# Patient Record
Sex: Male | Born: 1945 | Race: Black or African American | Hispanic: No | Marital: Married | Smoking: Former smoker
Health system: Southern US, Community
[De-identification: ages and names within clinical notes are randomized; demographics above are authoritative.]

## PROBLEM LIST (undated history)

## (undated) DIAGNOSIS — K649 Unspecified hemorrhoids: Secondary | ICD-10-CM

## (undated) DIAGNOSIS — H269 Unspecified cataract: Secondary | ICD-10-CM

## (undated) DIAGNOSIS — H9190 Unspecified hearing loss, unspecified ear: Secondary | ICD-10-CM

## (undated) DIAGNOSIS — E785 Hyperlipidemia, unspecified: Secondary | ICD-10-CM

## (undated) DIAGNOSIS — I10 Essential (primary) hypertension: Secondary | ICD-10-CM

## (undated) DIAGNOSIS — Z86010 Personal history of colonic polyps: Secondary | ICD-10-CM

## (undated) DIAGNOSIS — H409 Unspecified glaucoma: Secondary | ICD-10-CM

## (undated) DIAGNOSIS — I4891 Unspecified atrial fibrillation: Secondary | ICD-10-CM

## (undated) DIAGNOSIS — M1990 Unspecified osteoarthritis, unspecified site: Secondary | ICD-10-CM

## (undated) DIAGNOSIS — I2109 ST elevation (STEMI) myocardial infarction involving other coronary artery of anterior wall: Secondary | ICD-10-CM

## (undated) HISTORY — DX: Unspecified cataract: H26.9

## (undated) HISTORY — DX: Hyperlipidemia, unspecified: E78.5

## (undated) HISTORY — DX: Unspecified osteoarthritis, unspecified site: M19.90

## (undated) HISTORY — DX: ST elevation (STEMI) myocardial infarction involving other coronary artery of anterior wall: I21.09

## (undated) HISTORY — PX: STAPEDES SURGERY: SHX789

## (undated) HISTORY — PX: COLONOSCOPY: SHX174

## (undated) HISTORY — DX: Unspecified hearing loss, unspecified ear: H91.90

## (undated) HISTORY — DX: Essential (primary) hypertension: I10

## (undated) HISTORY — DX: Unspecified atrial fibrillation: I48.91

## (undated) HISTORY — DX: Personal history of colonic polyps: Z86.010

## (undated) HISTORY — PX: POLYPECTOMY: SHX149

## (undated) HISTORY — DX: Unspecified hemorrhoids: K64.9

## (undated) HISTORY — DX: Unspecified glaucoma: H40.9

---

## 2001-11-16 ENCOUNTER — Ambulatory Visit (HOSPITAL_COMMUNITY): Admission: RE | Admit: 2001-11-16 | Discharge: 2001-11-16 | Payer: Self-pay | Admitting: *Deleted

## 2001-11-16 ENCOUNTER — Encounter (INDEPENDENT_AMBULATORY_CARE_PROVIDER_SITE_OTHER): Payer: Self-pay

## 2001-11-16 ENCOUNTER — Encounter (INDEPENDENT_AMBULATORY_CARE_PROVIDER_SITE_OTHER): Payer: Self-pay | Admitting: *Deleted

## 2005-03-05 ENCOUNTER — Ambulatory Visit: Payer: Self-pay | Admitting: Family Medicine

## 2005-03-12 ENCOUNTER — Ambulatory Visit: Payer: Self-pay | Admitting: Family Medicine

## 2005-06-11 ENCOUNTER — Ambulatory Visit: Payer: Self-pay | Admitting: Family Medicine

## 2005-06-12 ENCOUNTER — Encounter: Admission: RE | Admit: 2005-06-12 | Discharge: 2005-06-12 | Payer: Self-pay | Admitting: Family Medicine

## 2005-10-17 ENCOUNTER — Ambulatory Visit: Payer: Self-pay | Admitting: Family Medicine

## 2006-06-04 ENCOUNTER — Ambulatory Visit: Payer: Self-pay | Admitting: Family Medicine

## 2006-06-04 LAB — CONVERTED CEMR LAB
ALT: 19 units/L (ref 0–40)
AST: 22 units/L (ref 0–37)
Alkaline Phosphatase: 38 units/L — ABNORMAL LOW (ref 39–117)
BUN: 11 mg/dL (ref 6–23)
Basophils Relative: 0.8 % (ref 0.0–1.0)
Bilirubin, Direct: 0.1 mg/dL (ref 0.0–0.3)
CO2: 33 meq/L — ABNORMAL HIGH (ref 19–32)
Calcium: 9.7 mg/dL (ref 8.4–10.5)
Chloride: 103 meq/L (ref 96–112)
Creatinine, Ser: 1 mg/dL (ref 0.4–1.5)
Eosinophils Relative: 1.6 % (ref 0.0–5.0)
Glucose, Bld: 108 mg/dL — ABNORMAL HIGH (ref 70–99)
LDL Cholesterol: 104 mg/dL — ABNORMAL HIGH (ref 0–99)
Lymphocytes Relative: 29.8 % (ref 12.0–46.0)
Neutro Abs: 4.4 10*3/uL (ref 1.4–7.7)
Platelets: 206 10*3/uL (ref 150–400)
RBC: 4.99 M/uL (ref 4.22–5.81)
RDW: 12.1 % (ref 11.5–14.6)
Total Protein: 7 g/dL (ref 6.0–8.3)
Triglycerides: 227 mg/dL (ref 0–149)
VLDL: 45 mg/dL — ABNORMAL HIGH (ref 0–40)
WBC: 7.9 10*3/uL (ref 4.5–10.5)

## 2006-06-11 ENCOUNTER — Ambulatory Visit: Payer: Self-pay | Admitting: Family Medicine

## 2006-07-29 ENCOUNTER — Ambulatory Visit: Payer: Self-pay | Admitting: Family Medicine

## 2006-09-23 DIAGNOSIS — I10 Essential (primary) hypertension: Secondary | ICD-10-CM

## 2006-09-23 DIAGNOSIS — E785 Hyperlipidemia, unspecified: Secondary | ICD-10-CM | POA: Insufficient documentation

## 2006-10-13 ENCOUNTER — Telehealth: Payer: Self-pay | Admitting: Family Medicine

## 2006-10-22 ENCOUNTER — Ambulatory Visit: Payer: Self-pay | Admitting: Family Medicine

## 2007-03-16 ENCOUNTER — Telehealth: Payer: Self-pay

## 2007-03-18 ENCOUNTER — Ambulatory Visit: Payer: Self-pay | Admitting: Family Medicine

## 2007-03-18 DIAGNOSIS — J209 Acute bronchitis, unspecified: Secondary | ICD-10-CM | POA: Insufficient documentation

## 2007-06-10 ENCOUNTER — Ambulatory Visit: Payer: Self-pay | Admitting: Family Medicine

## 2007-06-10 LAB — CONVERTED CEMR LAB
Blood in Urine, dipstick: NEGATIVE
Protein, U semiquant: NEGATIVE
Urobilinogen, UA: 0.2
WBC Urine, dipstick: NEGATIVE
pH: 7

## 2007-06-16 ENCOUNTER — Ambulatory Visit: Payer: Self-pay | Admitting: Family Medicine

## 2007-06-16 LAB — CONVERTED CEMR LAB
ALT: 22 units/L (ref 0–53)
AST: 25 units/L (ref 0–37)
Alkaline Phosphatase: 42 units/L (ref 39–117)
Basophils Absolute: 0 10*3/uL (ref 0.0–0.1)
Bilirubin, Direct: 0.1 mg/dL (ref 0.0–0.3)
CO2: 31 meq/L (ref 19–32)
Chloride: 101 meq/L (ref 96–112)
Cholesterol: 216 mg/dL (ref 0–200)
Glucose, Bld: 92 mg/dL (ref 70–99)
HDL: 33.1 mg/dL — ABNORMAL LOW (ref >39.0)
Hemoglobin: 15.7 g/dL (ref 13.0–17.0)
Lymphocytes Relative: 28.5 % (ref 12.0–46.0)
MCHC: 33.1 g/dL (ref 30.0–36.0)
Monocytes Relative: 12.2 % — ABNORMAL HIGH (ref 3.0–12.0)
Neutro Abs: 4.7 10*3/uL (ref 1.4–7.7)
Neutrophils Relative %: 57.8 % (ref 43.0–77.0)
RBC: 4.82 M/uL (ref 4.22–5.81)
RDW: 11.4 % — ABNORMAL LOW (ref 11.5–14.6)
Sodium: 139 meq/L (ref 135–145)
Total Bilirubin: 0.8 mg/dL (ref 0.3–1.2)
Total CHOL/HDL Ratio: 6.5
Total Protein: 7.3 g/dL (ref 6.0–8.3)
VLDL: 25 mg/dL (ref 0–40)

## 2007-07-14 ENCOUNTER — Encounter: Payer: Self-pay | Admitting: Family Medicine

## 2007-10-30 ENCOUNTER — Ambulatory Visit: Admission: RE | Admit: 2007-10-30 | Discharge: 2007-10-30 | Payer: Self-pay | Admitting: Cardiology

## 2007-10-30 ENCOUNTER — Ambulatory Visit: Payer: Self-pay | Admitting: Cardiology

## 2007-10-30 ENCOUNTER — Inpatient Hospital Stay (HOSPITAL_COMMUNITY): Admission: EM | Admit: 2007-10-30 | Discharge: 2007-11-04 | Payer: Self-pay | Admitting: Cardiology

## 2007-10-31 ENCOUNTER — Encounter: Payer: Self-pay | Admitting: Cardiology

## 2007-11-03 ENCOUNTER — Ambulatory Visit: Payer: Self-pay | Admitting: Surgery

## 2007-11-03 ENCOUNTER — Encounter: Payer: Self-pay | Admitting: Cardiology

## 2007-11-06 ENCOUNTER — Ambulatory Visit: Payer: Self-pay | Admitting: Cardiology

## 2007-11-12 ENCOUNTER — Ambulatory Visit: Payer: Self-pay | Admitting: Internal Medicine

## 2007-11-18 ENCOUNTER — Ambulatory Visit: Payer: Self-pay | Admitting: Cardiology

## 2007-11-18 LAB — CONVERTED CEMR LAB: INR: 2 — ABNORMAL HIGH (ref 0.8–1.0)

## 2007-11-23 ENCOUNTER — Ambulatory Visit: Payer: Self-pay | Admitting: Internal Medicine

## 2007-11-26 ENCOUNTER — Telehealth: Payer: Self-pay

## 2007-11-26 ENCOUNTER — Encounter (HOSPITAL_COMMUNITY): Admission: RE | Admit: 2007-11-26 | Discharge: 2008-02-24 | Payer: Self-pay | Admitting: Cardiology

## 2007-11-27 ENCOUNTER — Ambulatory Visit: Payer: Self-pay | Admitting: Cardiology

## 2007-12-10 ENCOUNTER — Ambulatory Visit: Payer: Self-pay | Admitting: Internal Medicine

## 2008-01-07 ENCOUNTER — Ambulatory Visit: Payer: Self-pay | Admitting: Cardiovascular Disease

## 2008-02-09 ENCOUNTER — Ambulatory Visit: Payer: Self-pay | Admitting: Cardiovascular Disease

## 2008-02-17 ENCOUNTER — Ambulatory Visit: Payer: Self-pay | Admitting: Cardiology

## 2008-02-19 ENCOUNTER — Ambulatory Visit: Payer: Self-pay | Admitting: Cardiology

## 2008-02-25 ENCOUNTER — Encounter (HOSPITAL_COMMUNITY): Admission: RE | Admit: 2008-02-25 | Discharge: 2008-03-10 | Payer: Self-pay | Admitting: Cardiology

## 2008-03-08 ENCOUNTER — Ambulatory Visit: Payer: Self-pay | Admitting: Cardiovascular Disease

## 2008-03-11 ENCOUNTER — Encounter (HOSPITAL_COMMUNITY): Admission: RE | Admit: 2008-03-11 | Discharge: 2008-03-11 | Payer: Self-pay | Admitting: Cardiology

## 2008-03-29 ENCOUNTER — Ambulatory Visit: Payer: Self-pay | Admitting: Cardiology

## 2008-04-26 ENCOUNTER — Ambulatory Visit: Payer: Self-pay | Admitting: Cardiology

## 2008-05-24 ENCOUNTER — Ambulatory Visit: Payer: Self-pay | Admitting: Internal Medicine

## 2008-05-27 DIAGNOSIS — I2510 Atherosclerotic heart disease of native coronary artery without angina pectoris: Secondary | ICD-10-CM

## 2008-05-27 DIAGNOSIS — I4891 Unspecified atrial fibrillation: Secondary | ICD-10-CM

## 2008-05-30 ENCOUNTER — Encounter: Payer: Self-pay | Admitting: Cardiology

## 2008-05-30 ENCOUNTER — Ambulatory Visit: Payer: Self-pay | Admitting: Cardiology

## 2008-06-22 ENCOUNTER — Ambulatory Visit: Payer: Self-pay | Admitting: Family Medicine

## 2008-06-22 LAB — CONVERTED CEMR LAB
Bilirubin Urine: NEGATIVE
Glucose, Urine, Semiquant: NEGATIVE
Nitrite: NEGATIVE
Specific Gravity, Urine: 1.015
Urobilinogen, UA: 0.2
pH: 8.5

## 2008-06-29 ENCOUNTER — Ambulatory Visit: Payer: Self-pay | Admitting: Family Medicine

## 2008-06-29 DIAGNOSIS — K5900 Constipation, unspecified: Secondary | ICD-10-CM | POA: Insufficient documentation

## 2008-06-29 DIAGNOSIS — H9190 Unspecified hearing loss, unspecified ear: Secondary | ICD-10-CM | POA: Insufficient documentation

## 2008-06-29 DIAGNOSIS — N4889 Other specified disorders of penis: Secondary | ICD-10-CM

## 2008-06-29 LAB — CONVERTED CEMR LAB
AST: 29 units/L (ref 0–37)
Alkaline Phosphatase: 62 units/L (ref 39–117)
BUN: 8 mg/dL (ref 6–23)
Basophils Absolute: 0 10*3/uL (ref 0.0–0.1)
Calcium: 9.7 mg/dL (ref 8.4–10.5)
Cholesterol: 229 mg/dL — ABNORMAL HIGH (ref 0–200)
Creatinine, Ser: 1.3 mg/dL (ref 0.4–1.5)
Direct LDL: 134.2 mg/dL
Eosinophils Absolute: 0.1 10*3/uL (ref 0.0–0.7)
GFR calc non Af Amer: 71.42 mL/min (ref >60)
Glucose, Bld: 105 mg/dL — ABNORMAL HIGH (ref 70–99)
HDL: 31.8 mg/dL — ABNORMAL LOW (ref >39.00)
Lymphocytes Relative: 37.4 % (ref 12.0–46.0)
Monocytes Relative: 11.7 % (ref 3.0–12.0)
Platelets: 171 10*3/uL (ref 150.0–400.0)
RDW: 11.6 % (ref 11.5–14.6)
Sodium: 141 meq/L (ref 135–145)
TSH: 4.52 microintl units/mL (ref 0.35–5.50)
Total Bilirubin: 1.1 mg/dL (ref 0.3–1.2)
Triglycerides: 313 mg/dL — ABNORMAL HIGH (ref 0.0–149.0)

## 2008-07-05 ENCOUNTER — Ambulatory Visit: Payer: Self-pay | Admitting: Cardiology

## 2008-07-09 DIAGNOSIS — Z86010 Personal history of colonic polyps: Secondary | ICD-10-CM

## 2008-07-09 DIAGNOSIS — Z860101 Personal history of adenomatous and serrated colon polyps: Secondary | ICD-10-CM

## 2008-07-09 HISTORY — DX: Personal history of adenomatous and serrated colon polyps: Z86.0101

## 2008-07-09 HISTORY — DX: Personal history of colonic polyps: Z86.010

## 2008-07-12 ENCOUNTER — Encounter: Payer: Self-pay | Admitting: Family Medicine

## 2008-07-13 ENCOUNTER — Ambulatory Visit: Payer: Self-pay | Admitting: Family Medicine

## 2008-07-13 LAB — CONVERTED CEMR LAB
OCCULT 1: NEGATIVE
OCCULT 2: NEGATIVE

## 2008-07-15 ENCOUNTER — Encounter: Payer: Self-pay | Admitting: Family Medicine

## 2008-07-26 ENCOUNTER — Encounter: Payer: Self-pay | Admitting: Gastroenterology

## 2008-08-04 ENCOUNTER — Encounter: Payer: Self-pay | Admitting: Family Medicine

## 2008-08-10 ENCOUNTER — Encounter: Payer: Self-pay | Admitting: *Deleted

## 2008-08-16 ENCOUNTER — Ambulatory Visit: Payer: Self-pay | Admitting: Cardiology

## 2008-08-16 LAB — CONVERTED CEMR LAB
POC INR: 2
Protime: 17.3

## 2008-09-09 ENCOUNTER — Encounter: Payer: Self-pay | Admitting: Family Medicine

## 2008-09-14 ENCOUNTER — Encounter: Payer: Self-pay | Admitting: *Deleted

## 2008-09-27 ENCOUNTER — Ambulatory Visit: Payer: Self-pay | Admitting: Family Medicine

## 2008-09-27 ENCOUNTER — Ambulatory Visit: Payer: Self-pay | Admitting: Cardiology

## 2008-09-30 LAB — CONVERTED CEMR LAB
Albumin: 4.2 g/dL (ref 3.5–5.2)
Alkaline Phosphatase: 55 units/L (ref 39–117)
LDL Cholesterol: 79 mg/dL (ref 0–99)
Total CHOL/HDL Ratio: 4
Total Protein: 7.3 g/dL (ref 6.0–8.3)
Triglycerides: 146 mg/dL (ref 0.0–149.0)
VLDL: 29.2 mg/dL (ref 0.0–40.0)

## 2008-10-03 ENCOUNTER — Encounter (INDEPENDENT_AMBULATORY_CARE_PROVIDER_SITE_OTHER): Payer: Self-pay | Admitting: *Deleted

## 2008-10-05 ENCOUNTER — Ambulatory Visit: Payer: Self-pay | Admitting: Family Medicine

## 2008-10-05 DIAGNOSIS — K6289 Other specified diseases of anus and rectum: Secondary | ICD-10-CM

## 2008-10-05 DIAGNOSIS — K625 Hemorrhage of anus and rectum: Secondary | ICD-10-CM | POA: Insufficient documentation

## 2008-11-08 ENCOUNTER — Ambulatory Visit: Payer: Self-pay | Admitting: Cardiology

## 2008-12-05 ENCOUNTER — Ambulatory Visit: Payer: Self-pay | Admitting: Cardiology

## 2008-12-05 DIAGNOSIS — E785 Hyperlipidemia, unspecified: Secondary | ICD-10-CM

## 2008-12-20 ENCOUNTER — Ambulatory Visit: Payer: Self-pay | Admitting: Cardiology

## 2008-12-20 LAB — CONVERTED CEMR LAB: POC INR: 2.6

## 2009-01-19 ENCOUNTER — Encounter (INDEPENDENT_AMBULATORY_CARE_PROVIDER_SITE_OTHER): Payer: Self-pay | Admitting: *Deleted

## 2009-01-31 ENCOUNTER — Ambulatory Visit: Payer: Self-pay | Admitting: Cardiovascular Disease

## 2009-01-31 LAB — CONVERTED CEMR LAB: POC INR: 2.2

## 2009-02-01 ENCOUNTER — Ambulatory Visit: Payer: Self-pay | Admitting: Family Medicine

## 2009-02-01 DIAGNOSIS — IMO0001 Reserved for inherently not codable concepts without codable children: Secondary | ICD-10-CM

## 2009-03-14 ENCOUNTER — Ambulatory Visit: Payer: Self-pay | Admitting: Cardiology

## 2009-03-14 LAB — CONVERTED CEMR LAB: POC INR: 2.2

## 2009-04-25 ENCOUNTER — Ambulatory Visit: Payer: Self-pay | Admitting: Internal Medicine

## 2009-04-26 ENCOUNTER — Ambulatory Visit: Payer: Self-pay | Admitting: Family Medicine

## 2009-04-26 DIAGNOSIS — N390 Urinary tract infection, site not specified: Secondary | ICD-10-CM

## 2009-04-26 DIAGNOSIS — R350 Frequency of micturition: Secondary | ICD-10-CM | POA: Insufficient documentation

## 2009-04-26 DIAGNOSIS — T50995A Adverse effect of other drugs, medicaments and biological substances, initial encounter: Secondary | ICD-10-CM

## 2009-04-26 DIAGNOSIS — E559 Vitamin D deficiency, unspecified: Secondary | ICD-10-CM | POA: Insufficient documentation

## 2009-04-26 DIAGNOSIS — E039 Hypothyroidism, unspecified: Secondary | ICD-10-CM

## 2009-04-26 LAB — CONVERTED CEMR LAB
ALT: 39 units/L (ref 0–53)
Albumin: 4.6 g/dL (ref 3.5–5.2)
Alkaline Phosphatase: 51 units/L (ref 39–117)
Basophils Relative: 0.5 % (ref 0.0–3.0)
Bilirubin Urine: NEGATIVE
Bilirubin, Direct: 0.1 mg/dL (ref 0.0–0.3)
CO2: 32 meq/L (ref 19–32)
Chloride: 103 meq/L (ref 96–112)
Cholesterol: 242 mg/dL — ABNORMAL HIGH (ref 0–200)
Direct LDL: 151.3 mg/dL
Eosinophils Absolute: 0.1 10*3/uL (ref 0.0–0.7)
Eosinophils Relative: 1.1 % (ref 0.0–5.0)
Hemoglobin: 15.5 g/dL (ref 13.0–17.0)
MCHC: 33.3 g/dL (ref 30.0–36.0)
MCV: 99.7 fL (ref 78.0–100.0)
Monocytes Absolute: 0.9 10*3/uL (ref 0.1–1.0)
Neutro Abs: 3.8 10*3/uL (ref 1.4–7.7)
Neutrophils Relative %: 54.9 % (ref 43.0–77.0)
Nitrite: NEGATIVE
PSA: 1.18 ng/mL (ref 0.10–4.00)
Potassium: 3.2 meq/L — ABNORMAL LOW (ref 3.5–5.1)
RBC: 4.68 M/uL (ref 4.22–5.81)
Sodium: 141 meq/L (ref 135–145)
Total CHOL/HDL Ratio: 6
Total Protein: 7.6 g/dL (ref 6.0–8.3)
Urobilinogen, UA: 0.2
VLDL: 49.6 mg/dL — ABNORMAL HIGH (ref 0.0–40.0)
WBC Urine, dipstick: NEGATIVE
WBC: 7 10*3/uL (ref 4.5–10.5)

## 2009-05-03 ENCOUNTER — Telehealth: Payer: Self-pay | Admitting: Family Medicine

## 2009-05-11 ENCOUNTER — Ambulatory Visit: Payer: Self-pay | Admitting: Family Medicine

## 2009-05-11 DIAGNOSIS — R51 Headache: Secondary | ICD-10-CM

## 2009-05-11 DIAGNOSIS — R519 Headache, unspecified: Secondary | ICD-10-CM | POA: Insufficient documentation

## 2009-05-15 ENCOUNTER — Telehealth: Payer: Self-pay | Admitting: Cardiology

## 2009-05-17 ENCOUNTER — Ambulatory Visit: Payer: Self-pay

## 2009-05-17 ENCOUNTER — Encounter: Payer: Self-pay | Admitting: Family Medicine

## 2009-05-25 ENCOUNTER — Ambulatory Visit: Payer: Self-pay | Admitting: Cardiology

## 2009-06-06 ENCOUNTER — Ambulatory Visit: Payer: Self-pay | Admitting: Internal Medicine

## 2009-07-18 ENCOUNTER — Ambulatory Visit: Payer: Self-pay | Admitting: Cardiology

## 2009-07-18 ENCOUNTER — Ambulatory Visit: Payer: Self-pay | Admitting: Cardiovascular Disease

## 2009-07-18 LAB — CONVERTED CEMR LAB: POC INR: 2.3

## 2009-07-27 LAB — CONVERTED CEMR LAB
ALT: 23 units/L (ref 0–53)
Cholesterol: 147 mg/dL (ref 0–200)
HDL: 35.8 mg/dL — ABNORMAL LOW (ref >39.00)
LDL Cholesterol: 81 mg/dL (ref 0–99)
Total Protein: 6.9 g/dL (ref 6.0–8.3)
VLDL: 29.8 mg/dL (ref 0.0–40.0)

## 2009-08-29 ENCOUNTER — Ambulatory Visit: Payer: Self-pay

## 2009-08-29 LAB — CONVERTED CEMR LAB: POC INR: 3.5

## 2009-10-10 ENCOUNTER — Ambulatory Visit: Payer: Self-pay | Admitting: Cardiology

## 2009-10-10 LAB — CONVERTED CEMR LAB: POC INR: 3.1

## 2009-11-21 ENCOUNTER — Ambulatory Visit: Payer: Self-pay | Admitting: Cardiovascular Disease

## 2009-11-21 LAB — CONVERTED CEMR LAB: POC INR: 3.2

## 2009-12-18 ENCOUNTER — Telehealth (INDEPENDENT_AMBULATORY_CARE_PROVIDER_SITE_OTHER): Payer: Self-pay | Admitting: *Deleted

## 2009-12-20 ENCOUNTER — Ambulatory Visit: Payer: Self-pay | Admitting: Family Medicine

## 2009-12-20 DIAGNOSIS — D126 Benign neoplasm of colon, unspecified: Secondary | ICD-10-CM

## 2009-12-20 LAB — HM COLONOSCOPY

## 2009-12-21 LAB — CONVERTED CEMR LAB
Albumin: 4.6 g/dL (ref 3.5–5.2)
Alkaline Phosphatase: 66 units/L (ref 39–117)
BUN: 13 mg/dL (ref 6–23)
Calcium: 10.1 mg/dL (ref 8.4–10.5)
Cholesterol: 155 mg/dL (ref 0–200)
Creatinine, Ser: 1.3 mg/dL (ref 0.4–1.5)
Eosinophils Relative: 0.8 % (ref 0.0–5.0)
GFR calc non Af Amer: 73.03 mL/min (ref >60)
HDL: 35.2 mg/dL — ABNORMAL LOW (ref >39.00)
LDL Cholesterol: 91 mg/dL (ref 0–99)
Lymphocytes Relative: 24.8 % (ref 12.0–46.0)
Monocytes Absolute: 1 10*3/uL (ref 0.1–1.0)
Monocytes Relative: 10.6 % (ref 3.0–12.0)
Neutrophils Relative %: 63.2 % (ref 43.0–77.0)
Platelets: 185 10*3/uL (ref 150.0–400.0)
RBC: 4.76 M/uL (ref 4.22–5.81)
TSH: 3.58 microintl units/mL (ref 0.35–5.50)
Total Protein: 7.4 g/dL (ref 6.0–8.3)
Triglycerides: 145 mg/dL (ref 0.0–149.0)
VLDL: 29 mg/dL (ref 0.0–40.0)
WBC: 9.8 10*3/uL (ref 4.5–10.5)

## 2009-12-22 ENCOUNTER — Encounter: Payer: Self-pay | Admitting: Cardiology

## 2009-12-22 ENCOUNTER — Ambulatory Visit: Payer: Self-pay | Admitting: Cardiology

## 2009-12-25 ENCOUNTER — Encounter (INDEPENDENT_AMBULATORY_CARE_PROVIDER_SITE_OTHER): Payer: Self-pay | Admitting: *Deleted

## 2009-12-25 ENCOUNTER — Encounter: Payer: Self-pay | Admitting: Family Medicine

## 2010-01-02 ENCOUNTER — Ambulatory Visit: Payer: Self-pay | Admitting: Cardiology

## 2010-01-02 LAB — CONVERTED CEMR LAB: POC INR: 2.2

## 2010-01-03 ENCOUNTER — Encounter (INDEPENDENT_AMBULATORY_CARE_PROVIDER_SITE_OTHER): Payer: Self-pay | Admitting: *Deleted

## 2010-01-03 ENCOUNTER — Ambulatory Visit: Payer: Self-pay | Admitting: Gastroenterology

## 2010-01-22 ENCOUNTER — Telehealth (INDEPENDENT_AMBULATORY_CARE_PROVIDER_SITE_OTHER): Payer: Self-pay | Admitting: *Deleted

## 2010-02-05 ENCOUNTER — Ambulatory Visit: Payer: Self-pay | Admitting: Gastroenterology

## 2010-02-05 DIAGNOSIS — Z860100 Personal history of colon polyps, unspecified: Secondary | ICD-10-CM | POA: Insufficient documentation

## 2010-02-13 ENCOUNTER — Ambulatory Visit: Payer: Self-pay | Admitting: Cardiovascular Disease

## 2010-03-21 ENCOUNTER — Encounter
Admission: RE | Admit: 2010-03-21 | Discharge: 2010-03-21 | Payer: Self-pay | Source: Home / Self Care | Attending: Otolaryngology | Admitting: Otolaryngology

## 2010-03-23 ENCOUNTER — Encounter: Payer: Self-pay | Admitting: Family Medicine

## 2010-03-27 ENCOUNTER — Ambulatory Visit: Admission: RE | Admit: 2010-03-27 | Discharge: 2010-03-27 | Payer: Self-pay | Source: Home / Self Care

## 2010-03-27 LAB — CONVERTED CEMR LAB: POC INR: 2.3

## 2010-04-10 ENCOUNTER — Telehealth (INDEPENDENT_AMBULATORY_CARE_PROVIDER_SITE_OTHER): Payer: Self-pay | Admitting: *Deleted

## 2010-04-10 NOTE — Letter (Signed)
Summary: New Patient letter  [facility]  [address] [place], [place] [address]   Phone: [phone]  Fax: [phone]       01/03/2010 MRN: [id number]  [name] [address] [place], [place]  [address]  Dear [name],  Welcome to the Gastroenterology Division at [facility].    You are scheduled to see Dr.  Russella Wells on 02-05-10 at 10:00a.m. on the 3rd floor at [facility], [address]  We ask that you try to arrive at our office 15 minutes prior to your appointment time to allow for check-in.  We would like you to complete the enclosed self-administered evaluation form prior to your visit and bring it with you on the day of your appointment.  We will review it with you.  Also, please bring a complete list of all your medications or, if you prefer, bring the medication bottles and we will list them.  Please bring your insurance card so that we may make a copy of it.  If your insurance requires a referral to see a specialist, please bring your referral form from your primary care physician.  Co-payments are due at the time of your visit and may be paid by cash, check or credit card.     Your office visit will consist of a consult with your physician (includes a physical exam), any laboratory testing he/she may order, scheduling of any necessary diagnostic testing (e.g. x-ray, ultrasound, CT-scan), and scheduling of a procedure (e.g. Endoscopy, Colonoscopy) if required.  Please allow enough time on your schedule to allow for any/all of these possibilities.    If you cannot keep your appointment, please call [phone] to cancel or reschedule prior to your appointment date.  This allows [place] the opportunity to schedule an appointment for another patient in need of care.  If you do not cancel or reschedule by 5 p.m. the business day prior to your appointment date, you will be charged a $50.00 late cancellation/no-show fee.    Thank you for choosing  [facility] for your medical needs.  We appreciate the opportunity to care for you.  Please visit [place] at our website  to learn more about our practice.                     Sincerely,                                                             The Gastroenterology Division

## 2010-04-10 NOTE — Progress Notes (Signed)
Summary: Pt has some questions re: his medications  Phone Note Call from Patient Call back at Home Phone [phone]   Caller: Patient Summary of Call: Pt called and has question re: prescription. Please call asap.  Initial call taken by: Lucy Antigua,  May 03, 2009 11:43 AM  Follow-up for Phone Call        [facility] Follow-up by: Pura Spice, RN,  May 03, 2009 2:25 PM  Additional Follow-up for Phone Call Additional follow up Details #1::        Pt returned your call.  Additional Follow-up by: Lucy Antigua,  May 03, 2009 3:41 PM    Additional Follow-up for Phone Call Additional follow up Details #2::    pt notified and has questions about zestril and potassium /. informed pt dr Scotty Court will call  Follow-up by: Pura Spice, RN,  May 03, 2009 5:25 PM   Appended Document: Pt has some questions re: his medications discussed ,, no problem

## 2010-04-10 NOTE — Letter (Signed)
Summary: Request for Medical Clearance/[facility]  Request for Medical [facility]   Imported By: Maryln Gottron 01/11/2010 11:22:12  _____________________________________________________________________  External Attachment:    Type:   Image     Comment:   External Document

## 2010-04-10 NOTE — Assessment & Plan Note (Signed)
Summary: PT WILL COME IN FASTING (CPX) // RS   Vital Signs:  Patient profile:   65 year old male Height:      64 inches Weight:      179 pounds O2 Sat:      98 % Temp:     97.7 degrees F Pulse rate:   110 / minute BP sitting:   140 / 96  (left arm) Cuff size:   large  Vitals Entered By: Pura Spice, RN (April 26, 2009 10:01 AM) CC: go over problems refill meds insurance dont cover benicar   Is Patient Diabetic? No   History of Present Illness: This 65 year old Afro-American is in for his first Medicare physical He is a known hypertensive who has been on Benicar but cannot get this because of insurance and will change to Cozaar Unable to take statins cause myositis relates he has had a decreased sex drive. Continues to have occasional low back pain but not sufficient to take any medication. Past past history of atrial fibrillation and is on warfarin Past history of Peyronies Disease but has improved him of extra is decreased Past colonoscopic examination 2 years ago by Dr. 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 atrial fibrillation Hypertension Arthritis Ulcers hyperlipidemia Colonic polyps, hx of  Hard-of-hearing  Social History: Last updated: 09/23/2006 Retired Married Former Smoker Alcohol use-yes Drug use-no Regular exercise-yes  Risk Factors: Smoking Status: quit (09/23/2006)  Review of Systems  The patient denies anorexia, fever, weight loss, weight gain, vision loss, decreased hearing, hoarseness, chest pain, syncope, dyspnea on exertion, peripheral edema, prolonged cough, headaches, hemoptysis, abdominal pain, melena,  hematochezia, severe indigestion/heartburn, hematuria, incontinence, genital sores, muscle weakness, suspicious skin lesions, transient blindness, difficulty walking, depression, unusual weight change, abnormal bleeding, enlarged lymph nodes, angioedema, breast masses, and testicular masses.    Physical Exam  General:  Well-developed,well-nourished,in no acute distress; alert,appropriate and cooperative throughout examination Head:  Normocephalic and atraumatic without obvious abnormalities. No apparent alopecia or balding. Eyes:  No corneal or conjunctival inflammation noted. EOMI. Perrla. Funduscopic exam benign, without hemorrhages, exudates or papilledema. Vision grossly normal. Ears:  External ear exam shows no significant lesions or deformities.  Otoscopic examination reveals clear canals, tympanic membranes are intact bilaterally without bulging, retraction, inflammationr discharge. hearing is greatly diminished   Nose:  External nasal examination shows no deformity or inflammation. Nasal mucosa are pink and moist without lesions or exudates. Mouth:  pharynx pink and moist.   Neck:  No deformities, masses, or tenderness noted. Chest Wall:  No deformities, masses, tenderness or gynecomastia noted. Breasts:  No masses or gynecomastia noted Lungs:  Normal respiratory effort, chest expands symmetrically. Lungs are clear to auscultation, no crackles or wheezes. Heart:  Normal rate and regular rhythm. S1 and S2 normal without gallop, murmur, click, rub or other extra sounds. Abdomen:  Bowel sounds positive,abdomen soft and non-tender without masses, organomegaly or hernias noted. Rectal:  No external abnormalities noted. Normal sphincter tone. No rectal masses or tenderness. Genitalia:  Testes bilaterally descended without nodularity, tenderness or masses. No scrotal masses or lesions. No penis lesions or urethral discharge. no signs ofPeyronnies dx Prostate:  no nodules, no asymmetry, and 1+  enlarged.  Msk:  No deformity or scoliosis noted of thoracic or lumbar spine.   Pulses:  R and L carotid,radial,femoral,dorsalis pedis and posterior tibial pulses are full and equal bilaterally Extremities:  No clubbing, cyanosis, edema, or deformity noted with normal full range of motion of all joints.   Neurologic:  No cranial nerve deficits noted. Station and gait are normal. Plantar reflexes are down-going bilaterally. DTRs are symmetrical throughout. Sensory, motor and coordinative functions appear intact. Skin:  Intact without suspicious lesions or rashes Cervical Nodes:  No lymphadenopathy noted Axillary Nodes:  No palpable lymphadenopathy Inguinal Nodes:  No significant adenopathy Psych:  Cognition and judgment appear intact. Alert and cooperative with normal attention span and concentration. No apparent delusions, illusions, hallucinations   Impression & Recommendations:  Problem # 1:  MYALGIA (ICD-729.1) Assessment Improved  His updated medication list for this problem includes:    Aspirin 81 Mg Tabs (Aspirin) .Marland Kitchen... 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 Take 1 tablet by mouth once a day    Warfarin Sodium 5 Mg Tabs (Warfarin sodium) .Marland Kitchen... As diretced    Cardizem Cd 180 Mg Xr24h-cap (Diltiazem hcl coated beads) .Marland Kitchen... Take 1 capsule by mouth as directed  Problem # 6:  MYOCARDIAL INFARCTION (ICD-410.90) Assessment: Improved  The following medications were removed from the medication list:    Benicar 40 Mg Tabs (Olmesartan medoxomil) .Marland Kitchen... Take one tablet by mouth daily His  updated medication list for this problem includes:    Aspirin 81 Mg Tabs (Aspirin) .Marland Kitchen... Take 1 tablet by mouth once a day    Nitroglycerin 0.4 Mg Subl (Nitroglycerin) ..... One tablet under tongue every 5 minutes as needed for chest pain---may repeat times three    Cardizem Cd 180 Mg Xr24h-cap (Diltiazem hcl coated beads) .Marland Kitchen... Take 1 capsule by mouth as directed    Zestril 40 Mg Tabs (Lisinopril) .Marland Kitchen... 1  each day  Problem # 7:  PHYSICAL EXAMINATION (ICD-V70.0) Assessment: New  Problem # 8:  HYPERLIPIDEMIA (ICD-272.4) Assessment: Deteriorated  His updated medication list for this problem includes:    Lipitor 40 Mg Tabs (Atorvastatin calcium) .Marland Kitchen... 1  qd    Zetia 10 Mg Tabs (Ezetimibe) ..... One q.d. for lipid treatment  Orders: TLB-Lipid Panel (80061-LIPID) TLB-Hepatic/Liver Function Pnl (80076-HEPATIC) Prescription Created Electronically [id number])  Complete Medication List: 1)  Aspirin 81 Mg Tabs (Aspirin) .... Take 1 tablet by mouth once a day 2)  Warfarin Sodium 5 Mg Tabs (Warfarin sodium) .... As diretced 3)  Nitroglycerin 0.4 Mg Subl (Nitroglycerin) .... One tablet under tongue every 5 minutes as needed for chest pain---may repeat times three 4)  Lipitor 40 Mg Tabs (Atorvastatin calcium) .Marland Kitchen.. 1  qd 5)  Cardizem Cd 180 Mg Xr24h-cap (Diltiazem hcl coated beads) .... Take 1 capsule by mouth as directed 6)  Zestril 40 Mg Tabs (Lisinopril) .Marland Kitchen.. 1  each day 7)  Zetia 10 Mg Tabs (Ezetimibe) .... One q.d. for lipid treatment  Other Orders: Venipuncture (11914) T-Vitamin D (25-Hydroxy) (78295-62130) UA Dipstick w/o Micro (automated)  (81003) TLB-BMP (Basic Metabolic Panel-BMET) (80048-METABOL) TLB-CBC Platelet - w/Differential (85025-CBCD) TLB-TSH (Thyroid Stimulating Hormone) (84443-TSH) TLB-PSA (Prostate Specific Antigen) (  84153-PSA)  Patient Instructions: 1)  physical examination reveals a healthy male who has difficult. Hearing 2)  Hyperlipidemia but unable to take statins  and will start Zetia 10 mg each day 3)  Will refill medications 4)  EKG reveal no ischemia nor acute problem but evidence of old septal infarction Prescriptions: ZETIA 10 MG TABS (EZETIMIBE) one q.d. for lipid treatment  #30 x 11   Entered and Authorized by:   Judithann Sheen MD   Signed by:   Judithann Sheen MD on 04/26/2009   Method used:   Electronically to        [facility] (retail)       [address]       [place], [place]  [id number]       Ph: [phone]       Fax: [phone]   RxID:   [id number] ZESTRIL 40 MG TABS (LISINOPRIL) 1  each day  #30 x 11   Entered and Authorized by:   Judithann Sheen MD   Signed by:   Judithann Sheen MD on 04/26/2009   Method used:   Electronically to        [facility] (retail)       [address]       [place], [place]  [id number]       Ph: [phone]       Fax: [phone]   RxID:   [id number] WARFARIN SODIUM 5 MG TABS (WARFARIN SODIUM) as diretced  #45 x 11   Entered and Authorized by:   Judithann Sheen MD   Signed by:   Judithann Sheen MD on 04/26/2009   Method used:   Electronically to        [facility] [address] (retail)       [address]       [place], [place]  [id number]       Ph: [phone]       Fax: [phone]   RxID:   [id number]   Laboratory Results   Urine Tests  Date/Time Recieved: April 26, 2009 11:46 AM  Date/Time Reported: April 26, 2009 11:45 AM   Routine Urinalysis   Color: yellow Appearance: Clear Glucose: negative   (Normal Range: Negative) Bilirubin: negative   (Normal Range: Negative) Ketone: negative   (Normal Range: Negative) Spec. Gravity: 1.015   (Normal Range: 1.003-1.035) Blood: negative   (Normal Range: Negative) pH: 7.5   (Normal Range: 5.0-8.0) Protein: 1+   (Normal Range: Negative) Urobilinogen: 0.2   (Normal Range: 0-1) Nitrite: negative   (Normal Range:  Negative) Leukocyte Esterace: negative   (Normal Range: Negative)    Comments: Wynona Canes, CMA  April 26, 2009 11:46 AM

## 2010-04-10 NOTE — Procedures (Signed)
Summary: Colonoscopy/[facility]  Colonoscopy/[facility]   Imported By: Sherian Rein 02/07/2010 09:41:11  _____________________________________________________________________  External Attachment:    Type:   Image     Comment:   External Document

## 2010-04-10 NOTE — Medication Information (Signed)
Summary: rov/ewj  Anticoagulant Therapy  Managed by: Weston Brass, PharmD Referring MD: Olga Millers MD PCP: Scotty Court Supervising MD: Shirlee Latch MD, Freida Busman Indication 1: Atrial Fibrillation (ICD-427.31) Lab Used: [facility] Site: [facility] INR POC 2.2 INR RANGE 2 - 3  Dietary changes: no    Health status changes: no    Bleeding/hemorrhagic complications: no    Recent/future hospitalizations: no    Any changes in medication regimen? no    Recent/future dental: no  Any missed doses?: no       Is patient compliant with meds? yes       Allergies: No Known Drug Allergies  Anticoagulation Management History:      The patient is taking warfarin and comes in today for a routine follow up visit.  Positive risk factors for bleeding include an age of 66 years or older.  The bleeding index is 'intermediate risk'.  Positive CHADS2 values include History of HTN.  Negative CHADS2 values include Age > 66 years old.  The start date was 10/30/2007.  His last INR was 2.0 RATIO.  Anticoagulation responsible provider: Shirlee Latch MD, Jaquan Sadowsky.  INR POC: 2.2.  Cuvette Lot#: [id number].  Exp: 02/2011.    Anticoagulation Management Assessment/Plan:      The patient's current anticoagulation dose is Warfarin sodium 5 mg tabs: as diretced.  The target INR is 2 - 3.  The next INR is due 02/13/2010.  Anticoagulation instructions were given to patient.  Results were reviewed/authorized by Weston Brass, PharmD.  He was notified by Weston Brass PharmD.         Prior Anticoagulation Instructions: INR 3.2  Start taking 7.5mg  daily except 5mg  on Mondays, Thursdays, and Saturdays.  Recheck in 6 weeks.    Current Anticoagulation Instructions: INR 2.2  Continue same dose of 1 1/2 tablets every day except 1 tablet on Monday, Thursday and Saturday.  Recheck INR in 6 weeks.

## 2010-04-10 NOTE — Medication Information (Signed)
Summary: rov/tm  Anticoagulant Therapy  Managed by: Cloyde Reams, RN, BSN Referring MD: Olga Millers MD PCP: Scotty Court Supervising MD: Graciela Husbands MD, Viviann Spare Indication 1: Atrial Fibrillation (ICD-427.31) Lab Used: [facility] Site: [facility] INR POC 2.6 INR RANGE 2 - 3  Dietary changes: no    Health status changes: no    Bleeding/hemorrhagic complications: no    Recent/future hospitalizations: no    Any changes in medication regimen? no    Recent/future dental: no  Any missed doses?: no       Is patient compliant with meds? yes       Allergies (verified): No Known Drug Allergies  Anticoagulation Management History:      The patient is taking warfarin and comes in today for a routine follow up visit.  Positive risk factors for bleeding include an age of 66 years or older.  The bleeding index is 'intermediate risk'.  Positive CHADS2 values include History of HTN.  Negative CHADS2 values include Age > 72 years old.  The start date was 10/30/2007.  His last INR was 2.0 RATIO.  Anticoagulation responsible provider: Graciela Husbands MD, Viviann Spare.  INR POC: 2.6.  Cuvette Lot#: [id number].  Exp: 06/2010.    Anticoagulation Management Assessment/Plan:      The patient's current anticoagulation dose is Warfarin sodium 5 mg tabs: as diretced.  The target INR is 2 - 3.  The next INR is due 06/06/2009.  Anticoagulation instructions were given to patient.  Results were reviewed/authorized by Cloyde Reams, RN, BSN.  He was notified by Cloyde Reams RN.         Prior Anticoagulation Instructions: INR 2.2 Continue 7.5mg s daily except 5mg s on Mondays and Thursdays. Recheck in 6 weeks.   Current Anticoagulation Instructions: INR 2.6  Continue on same dosage 7.5mg  daily except 5mg  on Mondays and Thursdays.  Recheck in 6 weeks.

## 2010-04-10 NOTE — Medication Information (Signed)
Summary: rov/sp  Anticoagulant Therapy  Managed by: Bethena Midget, RN, BSN Referring MD: Olga Millers MD PCP: Toni Amend, MD Supervising MD: Eden Emms MD, Theron Arista Indication 1: Atrial Fibrillation (ICD-427.31) Lab Used: [facility] Site: [facility] INR POC 2.1 INR RANGE 2 - 3  Dietary changes: no    Health status changes: no    Bleeding/hemorrhagic complications: no    Recent/future hospitalizations: no    Any changes in medication regimen? no    Recent/future dental: no  Any missed doses?: no       Is patient compliant with meds? yes       Allergies: No Known Drug Allergies  Anticoagulation Management History:      The patient is taking warfarin and comes in today for a routine follow up visit.  Positive risk factors for bleeding include an age of 66 years or older.  The bleeding index is 'intermediate risk'.  Positive CHADS2 values include History of HTN.  Negative CHADS2 values include Age > 36 years old.  The start date was 10/30/2007.  His last INR was 2.0 RATIO.  Anticoagulation responsible provider: Eden Emms MD, Theron Arista.  INR POC: 2.1.  Cuvette Lot#: [id number].  Exp: 12/2010.    Anticoagulation Management Assessment/Plan:      The patient's current anticoagulation dose is Warfarin sodium 5 mg tabs: as diretced.  The target INR is 2 - 3.  The next INR is due 03/27/2010.  Anticoagulation instructions were given to patient.  Results were reviewed/authorized by Bethena Midget, RN, BSN.  He was notified by Bethena Midget, RN, BSN.         Prior Anticoagulation Instructions: INR 2.2  Continue same dose of 1 1/2 tablets every day except 1 tablet on Monday, Thursday and Saturday.  Recheck INR in 6 weeks.   Current Anticoagulation Instructions: INR 2.1 Continue 7.5mg s everyday except 5mg s on Mondays, Thursdays and Saturdays. Recheck in 6 weeks.

## 2010-04-10 NOTE — Medication Information (Signed)
Summary: rov/vb  Anticoagulant Therapy  Managed by: Bethena Midget, RN, BSN Referring MD: Olga Millers MD PCP: Scotty Court Supervising MD: Juanda Chance MD, Winfrey Chillemi Indication 1: Atrial Fibrillation (ICD-427.31) Lab Used: [facility] Site: [facility] INR POC 2.2 INR RANGE 2 - 3  Dietary changes: no    Health status changes: no    Bleeding/hemorrhagic complications: no    Recent/future hospitalizations: no    Any changes in medication regimen? yes       Details: Stoppped Lipitor   Recent/future dental: no  Any missed doses?: no       Is patient compliant with meds? yes       Allergies: No Known Drug Allergies  Anticoagulation Management History:      The patient is taking warfarin and comes in today for a routine follow up visit.  Negative risk factors for bleeding include an age less than 69 years old.  The bleeding index is 'low risk'.  Positive CHADS2 values include History of HTN.  Negative CHADS2 values include Age > 29 years old.  The start date was 10/30/2007.  His last INR was 2.0 RATIO.  Anticoagulation responsible provider: Juanda Chance MD, Smitty Cords.  INR POC: 2.2.  Cuvette Lot#: [id number].  Exp: 04/2010.    Anticoagulation Management Assessment/Plan:      The patient's current anticoagulation dose is Warfarin sodium 5 mg tabs: as diretced.  The target INR is 2 - 3.  The next INR is due 04/25/2009.  Anticoagulation instructions were given to patient.  Results were reviewed/authorized by Bethena Midget, RN, BSN.  He was notified by Bethena Midget, RN, BSN.         Prior Anticoagulation Instructions: INR: 2.2  Continue on the current dosing regimen of 1 1/2 tablet daily except 1 tablet on Mondays and Thursdays.  Recheck in 6 weeks.  Current Anticoagulation Instructions: INR 2.2 Continue 7.5mg s daily except 5mg s on Mondays and Thursdays. Recheck in 6 weeks.

## 2010-04-10 NOTE — Progress Notes (Signed)
  Records faxed to Dr.Crouse @ [phone]. [name]  January 22, 2010 11:09 AM

## 2010-04-10 NOTE — Medication Information (Signed)
Summary: rov/sp  Anticoagulant Therapy  Managed by: Bethena Midget, RN, BSN Referring MD: Olga Millers MD PCP: Scotty Court Supervising MD: Riley Kill MD, Maisie Fus Indication 1: Atrial Fibrillation (ICD-427.31) Lab Used: [facility] Site: [facility] INR POC 3.5 INR RANGE 2 - 3  Dietary changes: no    Health status changes: no    Bleeding/hemorrhagic complications: no    Recent/future hospitalizations: no    Any changes in medication regimen? no    Recent/future dental: no  Any missed doses?: no       Is patient compliant with meds? yes      Comments: Pt aware of risk of bleeding.   Allergies: No Known Drug Allergies  Anticoagulation Management History:      The patient is taking warfarin and comes in today for a routine follow up visit.  Positive risk factors for bleeding include an age of 66 years or older.  The bleeding index is 'intermediate risk'.  Positive CHADS2 values include History of HTN.  Negative CHADS2 values include Age > 44 years old.  The start date was 10/30/2007.  His last INR was 2.0 RATIO.  Anticoagulation responsible provider: Riley Kill MD, Maisie Fus.  INR POC: 3.5.  Cuvette Lot#: [id number].  Exp: 11/2010.    Anticoagulation Management Assessment/Plan:      The patient's current anticoagulation dose is Warfarin sodium 5 mg tabs: as diretced.  The target INR is 2 - 3.  The next INR is due 10/10/2009.  Anticoagulation instructions were given to patient.  Results were reviewed/authorized by Bethena Midget, RN, BSN.  He was notified by Bethena Midget, RN, BSN.         Prior Anticoagulation Instructions: INR 2.3  Continue same dose of 1 1/2 tablet every day except 1 tablet on Monday and Thursday   Current Anticoagulation Instructions: INR 3.5 Skip Wednesday's dose then resume 7.5mg s everyday except 5mg s on Mondays and Thursdays. Recheck in 6 weeks.

## 2010-04-10 NOTE — Medication Information (Signed)
Summary: rov/tm  Anticoagulant Therapy  Managed by: Bethena Midget, RN, BSN Referring MD: Olga Millers MD PCP: Scotty Court Supervising MD: Juanda Chance MD, Bruce Indication 1: Atrial Fibrillation (ICD-427.31) Lab Used: [facility] Site: [facility] INR POC 3.1 INR RANGE 2 - 3  Dietary changes: no    Health status changes: no    Bleeding/hemorrhagic complications: no    Recent/future hospitalizations: no    Any changes in medication regimen? no    Recent/future dental: no  Any missed doses?: no       Is patient compliant with meds? yes       Allergies: No Known Drug Allergies  Anticoagulation Management History:      The patient is taking warfarin and comes in today for a routine follow up visit.  Positive risk factors for bleeding include an age of 66 years or older.  The bleeding index is 'intermediate risk'.  Positive CHADS2 values include History of HTN.  Negative CHADS2 values include Age > 47 years old.  The start date was 10/30/2007.  His last INR was 2.0 RATIO.  Anticoagulation responsible provider: Juanda Chance MD, Smitty Cords.  INR POC: 3.1.  Cuvette Lot#: [id number].  Exp: 12/2010.    Anticoagulation Management Assessment/Plan:      The patient's current anticoagulation dose is Warfarin sodium 5 mg tabs: as diretced.  The target INR is 2 - 3.  The next INR is due 11/21/2009.  Anticoagulation instructions were given to patient.  Results were reviewed/authorized by Bethena Midget, RN, BSN.  He was notified by Bethena Midget, RN, BSN.         Prior Anticoagulation Instructions: INR 3.5 Skip Wednesday's dose then resume 7.5mg s everyday except 5mg s on Mondays and Thursdays. Recheck in 6 weeks.   Current Anticoagulation Instructions: INR 3.1 Tomorrow take only 1/2 pill then continue 1.5 pills everyday except 1 pill on Mondays and Thursdays. Recheck in 6 weeks.

## 2010-04-10 NOTE — Letter (Signed)
Summary: Records from [facility] 2003 - 2010   Records from [facility] 2003 - 2010   Imported By: Maryln Gottron 09/01/2009 10:34:13  _____________________________________________________________________  External Attachment:    Type:   Image     Comment:   External Document  Appended Document: Records from [facility] 2003 - 2010  reviewed

## 2010-04-10 NOTE — Assessment & Plan Note (Signed)
Summary: F6M/DM   Primary Provider:  [facility]   History of Present Illness: [name] is a gentleman who has a history of an acute anterior myocardial infarction occurring in the setting of atrial fibrillation, felt secondary to an embolic event.  His LV function is preserved.  We have been treating with rate control and anticoagulation at his request. His previous cardiac catheterization in August of 2009 revealed an occluded LAD but no other obstructive disease. The LAD occlusion was felt secondary to an embolus and resolved with thrombus aspiration. Echocardiogram in August of 2009 showed hypokinesis of the distal inferior septum and apical inferior wall but normal LV function. Carotid Dopplers in March of 2011 showed 0-39% bilateral stenosis and followup was recommended in 2 years.  I last saw him in Sept. of 2010. Since then he denies any exertional chest pain, dyspnea on exertion, orthopnea, PND, pedal edema, palpitations, syncope or bleeding. He occasionally feels a brief stinging sensation in his chest that lasts one to 2 seconds. He notices this more at night. It does not radiate and it is not exertional. There is no associated symptoms.  Current Medications (verified): 1)  Aspirin 81 Mg  Tabs (Aspirin) .... Take 1 Tablet By Mouth Once A Day 2)  Warfarin Sodium 5 Mg Tabs (Warfarin Sodium) .... As Diretced 3)  Nitroglycerin 0.4 Mg Subl (Nitroglycerin) .... One Tablet Under Tongue Every 5 Minutes As Needed For Chest Pain---May Repeat Times Three 4)  Lipitor 40 Mg Tabs (Atorvastatin Calcium) .Marland Kitchen.. 1  Qd 5)  Cardizem Cd 240 Mg Xr24h-Cap (Diltiazem Hcl Coated Beads) .Marland Kitchen.. 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 Retired Married Former Smoker Alcohol use-yes Drug use-no Regular exercise-yes  Review of Systems       no fevers or chills, productive cough, hemoptysis, dysphasia, odynophagia, melena, hematochezia, dysuria, hematuria, rash, seizure activity, orthopnea, PND, pedal edema, claudication. Remaining systems are negative.   Vital Signs:  Patient profile:   65 year old male Height:      64 inches Weight:      177 pounds BMI:     30.49 Pulse rate:   90 / minute Pulse rhythm:   irregular Resp:     12 per minute BP sitting:   155 / 80  (left arm)  Vitals Entered By: Kem Parkinson (May 25, 2009 9:47 AM)  Physical Exam  General:  Well-developed well-nourished in no acute distress.  Skin is warm and dry.  HEENT is normal.  Neck is supple. No thyromegaly.  Chest is clear to auscultation with normal expansion.  Cardiovascular exam is irregular Abdominal exam nontender or distended. No masses palpated. Extremities show no edema. neuro grossly intact    EKG  Procedure date:  05/25/2009  Findings:      atrial fibrillation at a rate of 90. Axis normal. No significant ST changes.  Impression & Recommendations:  Problem # 1:  ATRIAL FIBRILLATION (ICD-427.31)  Patient remains in atrial fibrillation. I will increase Cardizem to 300 mg p.o. daily and he will need lifelong Coumadin and history of presumed embolic MI. He declined cardioversion previously. His updated medication list for this problem includes:    Aspirin 81 Mg Tabs (Aspirin) .Marland Kitchen... Take 1 tablet by mouth once a  day    Warfarin Sodium 5 Mg Tabs (Warfarin sodium) .Marland Kitchen... As diretced  His updated medication list for this problem includes:    Aspirin 81 Mg Tabs (Aspirin) .Marland Kitchen... Take 1 tablet by mouth once a day    Warfarin Sodium 5 Mg Tabs (Warfarin sodium) .Marland Kitchen... As diretced  Problem # 2:  HYPERLIPIDEMIA (ICD-272.4)  Will discontinue Lipitor at patient's request and  substitute Zocor 40 mg p.o. daily for financial reasons. Check lipids and liver 6 weeks later. His updated medication list for this problem includes:    Simvastatin 40 Mg Tabs (Simvastatin) .Marland Kitchen... Take one tablet by mouth daily at bedtime  His updated medication list for this problem includes:    Lipitor 40 Mg Tabs (Atorvastatin calcium) .Marland Kitchen... 1  qd  Problem # 3:  HYPERTENSION (ICD-401.9) Blood pressure elevated. Increase Cardizem CD to 300 mg p.o. daily. His updated medication list for this problem includes:    Aspirin 81 Mg Tabs (Aspirin) .Marland Kitchen... Take 1 tablet by mouth once a day    Cardizem Cd 300 Mg Xr24h-cap (Diltiazem hcl coated beads) ..... One tablet by mouth once daily  Problem # 4:  MYOCARDIAL INFARCTION (ICD-410.90) Continue aspirin and statin. Previous MI felt secondary to embolic event from atrial fibrillation. His updated medication list for this problem includes:    Aspirin 81 Mg Tabs (Aspirin) .Marland Kitchen... Take 1 tablet by mouth once a day    Warfarin Sodium 5 Mg Tabs (Warfarin sodium) .Marland Kitchen... As diretced    Nitroglycerin 0.4 Mg Subl (Nitroglycerin) ..... One tablet under tongue every 5 minutes as needed for chest pain---may repeat times three    Cardizem Cd 300 Mg Xr24h-cap (Diltiazem hcl coated beads) ..... One tablet by mouth once daily  His updated medication list for this problem includes:    Aspirin 81 Mg Tabs (Aspirin) .Marland Kitchen... Take 1 tablet by mouth once a day    Warfarin Sodium 5 Mg Tabs (Warfarin sodium) .Marland Kitchen... As diretced    Nitroglycerin 0.4 Mg Subl (Nitroglycerin) ..... One tablet under tongue every 5 minutes as needed for chest pain---may repeat times three    Cardizem Cd 300 Mg Xr24h-cap (Diltiazem hcl coated beads) ..... One tablet by mouth once daily  Problem # 5:  COUMADIN THERAPY (ICD-V58.61) Monitored in the Coumadin clinic. Goal INR 2-3. Consider dabigitran in the future.  Problem # 6:  HYPOTHYROIDISM (ICD-244.9)  Patient Instructions: 1)  Your physician  recommends that you schedule a follow-up appointment in: 9 MONTHS 2)  Your physician recommends that you return for a FASTING lipid profile: IN 6 WEEKS-MAY 3)  Your physician has recommended you make the following change in your medication:INCREASE CARDIZEM 300MG  ONCE DAILY 4)  AFTER FINISHED WITH LIPITOR START SIMVASTATIN 40MG  ONCE DAILY AT BEDTIME Prescriptions: SIMVASTATIN 40 MG TABS (SIMVASTATIN) Take one tablet by mouth daily at bedtime  #30 x 12   Entered by:   Deliah Goody, RN   Authorized by:   Ferman Hamming, MD, [facility]   Signed by:   Deliah Goody, RN on 05/25/2009   Method used:   Electronically to        [facility] [phone]* (retail)       [address]       [place], [place]  [address]       Ph: [phone]       Fax: [phone]   RxID:   [id number] CARDIZEM CD 300 MG XR24H-CAP (DILTIAZEM HCL COATED BEADS) one tablet by mouth once daily  #30  x 12   Entered by:   Deliah Goody, RN   Authorized by:   Ferman Hamming, MD, [facility]   Signed by:   Deliah Goody, RN on 05/25/2009   Method used:   Electronically to        [facility] [phone]* (retail)       [address]       [place], [place]  [address]       Ph: [phone]       Fax: [phone]   RxID:   [id number]

## 2010-04-10 NOTE — Assessment & Plan Note (Signed)
Summary: rov/ surgical clearance/. pt has medicare. [employer] of [email or web address]   Primary Provider:  [facility]   History of Present Illness: [name] is a gentleman who has a history of an acute anterior myocardial infarction occurring in the setting of atrial fibrillation, felt secondary to an embolic event.  His LV function is preserved.  We have been treating with rate control and anticoagulation at his request. His previous cardiac catheterization in August of 2009 revealed an occluded LAD but no other obstructive disease. The LAD occlusion was felt secondary to an embolus and resolved with thrombus aspiration. Echocardiogram in August of 2009 showed hypokinesis of the distal inferior septum and apical inferior wall but normal LV function. Carotid Dopplers in March of 2011 showed 0-39% bilateral stenosis and followup was recommended in 2 years.  I last saw him in March of 2011. Since then he occasionally feels a "stinging" in his chest in various locations that lasts seconds. There is no exertional chest pain. There is no dyspnea on exertion, orthopnea, PND, pedal edema or syncope. There is no bleeding.    Current Medications (verified): 1)  Aspirin 81 Mg  Tabs (Aspirin) .... Take 1 Tablet By Mouth Once A Day 2)  Warfarin Sodium 5 Mg Tabs (Warfarin Sodium) .... As Diretced 3)  Nitroglycerin 0.4 Mg Subl (Nitroglycerin) .... One Tablet Under Tongue Every 5 Minutes As Needed For Chest Pain---May Repeat Times Three 4)  Cardizem Cd 300 Mg Xr24h-Cap (Diltiazem Hcl Coated Beads) .... One Tablet By Mouth Once Daily 5)  Lisinopril 40 Mg Tabs (Lisinopril) .Marland Kitchen.. 1 Tab By Mouth Once Daily 6)  Lovastatin 20 Mg Tabs (Lovastatin) .Marland Kitchen.. 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 Retired Married Former Smoker Alcohol use-yes Drug use-no Regular exercise-yes  Review of Systems       no fevers or chills, productive cough, hemoptysis, dysphasia, odynophagia, melena, hematochezia, dysuria, hematuria, rash, seizure activity, orthopnea, PND, pedal edema, claudication. Remaining systems are negative.   Vital Signs:  Patient profile:   65 year old male Height:      64 inches Weight:      173 pounds BMI:     29.80 Pulse rate:   90 / minute Resp:     12 per minute BP sitting:   140 / 82  (left arm)  Vitals Entered By: Kem Parkinson (December 22, 2009 2:12 PM)  Physical Exam  General:  Well-developed well-nourished in no acute distress.  Skin is warm and dry.  HEENT is normal.  Neck is supple. No thyromegaly.  Chest is clear to auscultation with normal expansion.  Cardiovascular exam is irregular Abdominal exam nontender or distended. No masses palpated. Extremities show no edema. neuro grossly intact    EKG  Procedure date:  12/22/2009  Findings:      Atrial fibrillation at a rate of 77. No ST changes.  Impression & Recommendations:  Problem # 1:  PREOPERATIVE EXAMINATION (ICD-V72.84) No recent exertional chest pain. Patient may proceed with surgery for his ear without further cardiac evaluation. Note he has a history of embolic event causing anterior myocardial infarction. If his Coumadin must be discontinued for his surgery and I would recommend bridging with Lovenox. This was explained to the patient and his wife.  Problem # 2:  COUMADIN THERAPY (ICD-V58.61) Goal INR 2-3.  Problem # 3:  PURE HYPERCHOLESTEROLEMIA (ICD-272.0) Continue statin. Lipids and liver monitored by primary care. His updated medication list for this problem includes:    Lovastatin 20 Mg Tabs (Lovastatin) .Marland Kitchen... 1 tab by mouth at bedtime  Problem # 4:  ATRIAL FIBRILLATION  (ICD-427.31)  Continue Cardizem for rate control. Continue Coumadin. His updated medication list for this problem includes:    Aspirin 81 Mg Tabs (Aspirin) .Marland Kitchen... Take 1 tablet by mouth once a day    Warfarin Sodium 5 Mg Tabs (Warfarin sodium) .Marland Kitchen... As diretced  Orders: EKG w/ Interpretation ([id number])  Problem # 5:  HYPERTENSION (ICD-401.9) Blood pressure controlled on present medications. Will continue. His updated medication list for this problem includes:    Aspirin 81 Mg Tabs (Aspirin) .Marland Kitchen... Take 1 tablet by mouth once a day    Cardizem Cd 300 Mg Xr24h-cap (Diltiazem hcl coated beads) ..... One tablet by mouth once daily    Lisinopril 40 Mg Tabs (Lisinopril) .Marland Kitchen... 1 tab by mouth once daily  Patient Instructions: 1)  Your physician recommends that you schedule a follow-up appointment in: 6 months with Dr. Jens Som 2)  Your physician recommends that you continue on your current medications as directed. Please refer to the Current Medication list given to you today.

## 2010-04-10 NOTE — Assessment & Plan Note (Signed)
Summary: SURGICAL CLEARANCE PER PHONE NOTE//SLM   Vital Signs:  Patient profile:   65 year old male Height:      64 inches (162.56 cm) Weight:      174 pounds (79.09 kg) O2 Sat:      98 % on Room air Temp:     98.1 degrees F (36.72 degrees C) oral Pulse rate:   73 / minute BP sitting:   150 / 94  (left arm) Cuff size:   large  Vitals Entered By: Josph Macho RMA (December 20, 2009 9:28 AM)  O2 Flow:  Room air  Serial Vital Signs/Assessments:  Time      Position  BP       Pulse  Resp  Temp     By                     136/88                         Danise Edge MD  CC: Surgery Clearance/ CF Is Patient Diabetic? No   History of Present Illness: Patient in today for medical clearance. He is considering a cochlear implant on the left. He is interested in proceding but is also apprehensive as well. He feels well, no recent illness, no f/c/n/v/CP/palp/GI or GU c/o. He has no acute complaints except some mild pruritus occasionally  Current Problems (verified): 1)  Preoperative Examination  (ICD-V72.84) 2)  Myalgia  (ICD-729.1) 3)  Colonic Polyps  (ICD-211.3) 4)  Encounter For Long-term Use of Other Medications  (ICD-V58.69) 5)  Headache  (ICD-784.0) 6)  Physical Examination  (ICD-V70.0) 7)  Uti  (ICD-599.0) 8)  Frequency, Urinary  (ICD-788.41) 9)  Vitamin D Deficiency  (ICD-268.9) 10)  Special Screening Malignant Neoplasm of Prostate  (ICD-V76.44) 11)  Hypothyroidism  (ICD-244.9) 12)  Uns Advrs Eff Oth Rx Medicinal&biological Sbstnc  (ICD-995.29) 13)  Myalgia  (ICD-729.1) 14)  Pure Hypercholesterolemia  (ICD-272.0) 15)  Coumadin Therapy  (ICD-V58.61) 16)  Proctitis  (ICD-569.49) 17)  Rectal Bleeding  (ICD-569.3) 18)  Decreased Hearing, Bilateral  (ICD-389.9) 19)  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 Take 1 Tablet By Mouth Once A Day 2)  Warfarin Sodium 5 Mg Tabs (Warfarin Sodium) .... As Diretced 3)  Nitroglycerin 0.4 Mg Subl (Nitroglycerin) .... One Tablet Under Tongue Every 5 Minutes As Needed For Chest Pain---May Repeat Times Three 4)  Simvastatin 40 Mg Tabs (Simvastatin) .... Take One Tablet By Mouth Daily At Bedtime 5)  Cardizem Cd 300 Mg Xr24h-Cap (Diltiazem Hcl Coated Beads) .... One Tablet By Mouth Once Daily 6)  Lisinopril 40 Mg Tabs (Lisinopril) .Marland Kitchen.. 1 Tab By Mouth Once Daily  Allergies (verified): No Known Drug Allergies  Past History:  Family History: Last updated: 05/27/2008 noncontributory  Social History: Last updated: 09/23/2006 Retired Married Former Smoker Alcohol use-yes Drug use-no Regular exercise-yes  Risk Factors: Exercise: yes (09/23/2006)  Risk Factors: Smoking Status: quit (09/23/2006)  Past medical, surgical, family and social histories (including risk factors) reviewed, and no changes noted (except as noted below).  Past Medical History: Reviewed history from 05/25/2009 and no changes required.  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 noncontributory  Social History: Reviewed history from 09/23/2006 and no changes required. Retired Married Former Smoker Alcohol use-yes Drug use-no Regular exercise-yes  Review of Systems  The patient denies anorexia, fever, weight loss, weight gain, vision loss, decreased hearing, hoarseness, chest pain, syncope, dyspnea on exertion, peripheral edema, prolonged cough, headaches, hemoptysis, abdominal pain, melena,  hematochezia, severe indigestion/heartburn, hematuria, incontinence, muscle weakness, suspicious skin lesions, transient blindness, difficulty walking, depression, unusual weight change, abnormal bleeding, enlarged lymph nodes, and angioedema.    Physical Exam  General:  Well-developed,well-nourished,in no acute distress; alert,appropriate and cooperative throughout examination Head:  Normocephalic and atraumatic without obvious abnormalities. No apparent alopecia or balding. Eyes:  No corneal or conjunctival inflammation noted. EOMI. Perrla.  Ears:  External ear exam shows no significant lesions or deformities.  Otoscopic examination reveals clear canals, tympanic membranes are intact bilaterally without bulging, retraction, inflammation or discharge. Hearing is grossly normal bilaterally. hearing aides in place b/l Nose:  External nasal examination shows no deformity or inflammation. Nasal mucosa are pink and moist without lesions or exudates. Mouth:  Oral mucosa and oropharynx without lesions or exudates.  Teeth in good repair. Neck:  No deformities, masses, or tenderness noted. Lungs:  Normal respiratory effort, chest expands symmetrically. Lungs are clear to auscultation, no crackles or wheezes. Heart:  normal rate, irregular rhythm, and grade 1 /6 systolic murmur.   Abdomen:  Bowel sounds positive,abdomen soft and non-tender without masses, organomegaly or hernias noted. Msk:  No deformity or scoliosis noted of thoracic or lumbar spine.   Pulses:  R and L carotid,dorsalis pedis and posterior tibial pulses are full and equal bilaterally Extremities:  No clubbing, cyanosis, edema, or deformity noted with normal full range of motion of all joints.   Neurologic:  No cranial nerve deficits noted. Station and gait are normal. Plantar reflexes are down-going bilaterally. DTRs are symmetrical throughout. Sensory, motor and coordinative functions appear intact. Skin:  Intact without suspicious lesions  or rashes Cervical Nodes:  No lymphadenopathy noted Psych:  Cognition and judgment appear intact. Alert and cooperative with normal attention span and concentration. No apparent delusions, illusions, hallucinations   Impression & Recommendations:  Problem # 1:  PREOPERATIVE EXAMINATION (ICD-V72.84) He has an extensive cardiac history so we will send him back to his cardiologistw for final cardiac clearance since he has not seen him in nearly a year. Fortunately he appears stable so we are able to clear him in every other regard at today's exam if he has no clinical changes between now and his surgical procedure. He is hoping to have surgery done to improve his hearing in the left ear due to his progressive hearing loss. He will discuss any final concerns regarding risks and benefits of the surgery with his surgeon prio to proceeding. Will need to hold Coumadin and Aspirin the week prior to surgery. If cleared by cardiology patient will be able to under go anesthesia for his procedue  Problem # 2:  COLONIC POLYPS (ICD-211.3)  Orders: Gastroenterology Referral (GI) Referred for colonoscopy. His previous gastroenterologist recommended he repeat his colonoscopy in the summer of 2010 per the chart but he has not done so yet. No concerning symptoms presently but he did have a hyperplastic polyp.  Problem # 3:  HYPERTENSION (ICD-401.9)  His updated medication list for this problem includes:    Cardizem  Cd 300 Mg Xr24h-cap (Diltiazem hcl coated beads) ..... One tablet by mouth once daily    Lisinopril 40 Mg Tabs (Lisinopril) .Marland Kitchen... 1 tab by mouth once daily Impvoed on repeat, no changes today  Problem # 4:  HYPERLIPIDEMIA (ICD-272.4)  The following medications were removed from the medication list:    Simvastatin 40 Mg Tabs (Simvastatin) .Marland Kitchen... Take one tablet by mouth daily at bedtime His updated medication list for this problem includes:    Lovastatin 20 Mg Tabs (Lovastatin) .Marland Kitchen... 1 tab by mouth  at bedtime Was having some myalgias on the Simvastatin so we will try Lovastatin if myalgias do occur will need to try Crewstor or Livalo.  Complete Medication List: 1)  Aspirin 81 Mg Tabs (Aspirin) .... Take 1 tablet by mouth once a day 2)  Warfarin Sodium 5 Mg Tabs (Warfarin sodium) .... As diretced 3)  Nitroglycerin 0.4 Mg Subl (Nitroglycerin) .... One tablet under tongue every 5 minutes as needed for chest pain---may repeat times three 4)  Cardizem Cd 300 Mg Xr24h-cap (Diltiazem hcl coated beads) .... One tablet by mouth once daily 5)  Lisinopril 40 Mg Tabs (Lisinopril) .Marland Kitchen.. 1 tab by mouth once daily 6)  Lovastatin 20 Mg Tabs (Lovastatin) .Marland Kitchen.. 1 tab by mouth at bedtime  Other Orders: Venipuncture (44010) Specimen Handling (27253) State-Pneumococcal Vaccine (66440H) Admin 1st Vaccine (47425) TLB-PSA (Prostate Specific Antigen) (84153-PSA) T-2 View CXR (71020TC) TLB-BMP (Basic Metabolic Panel-BMET) (80048-METABOL) TLB-CBC Platelet - w/Differential (85025-CBCD) TLB-Lipid Panel (80061-LIPID) TLB-Hepatic/Liver Function Pnl (80076-HEPATIC) TLB-TSH (Thyroid Stimulating Hormone) (95638-VFI) Cardiology Referral (Cardiology)  Patient Instructions: 1)  Please schedule a follow-up appointment in 1 month.  2)  Stop Simvastatin, in 1 week start Lovastatin if pain returns let us know. 3)  No Aspirin, Aleve, Ibuprofen a week prior to surgery 4)  Cardiology will hold your Coumadin as well Prescriptions: LOVASTATIN 20 MG TABS (LOVASTATIN) 1 tab by mouth at bedtime  #30 x 3   Entered and Authorized by:   Danise Edge MD   Signed by:   Danise Edge MD on 12/20/2009   Method used:   Electronically to        [facility] [phone]* (retail)       [address]       [place], [place]  [address]       Ph: [phone]       Fax: [phone]   RxID:   [id number]    Immunizations Administered:  Pneumonia Vaccine:    Vaccine Type: Pneumovax (State)    Site: left deltoid    Mfr:  Merck    Dose: 0.5 ml    Route: IM    Given by: Josph Macho RMA    Exp. Date: 05/24/2011    Lot #: [id number]    VIS given: 02/13/09 version given December 20, 2009.

## 2010-04-10 NOTE — Miscellaneous (Signed)
Summary: Orders Update  Clinical Lists Changes  Orders: Added new Test order of Carotid Duplex (Carotid Duplex) - Signed 

## 2010-04-10 NOTE — Medication Information (Signed)
Summary: [name]  Anticoagulant Therapy  Managed by: Weston Brass, PharmD Referring MD: Olga Millers MD PCP: Scotty Court Supervising MD: Eden Emms MD, Theron Arista Indication 1: Atrial Fibrillation (ICD-427.31) Lab Used: [facility]  Site: [facility] INR POC 2.3 INR RANGE 2 - 3  Dietary changes: no    Health status changes: no    Bleeding/hemorrhagic complications: no    Recent/future hospitalizations: no    Any changes in medication regimen? no    Recent/future dental: no  Any missed doses?: no       Is patient compliant with meds? yes       Current Medications (verified): 1)  Aspirin 81 Mg  Tabs (Aspirin) .... Take 1 Tablet By Mouth Once A Day 2)  Warfarin Sodium 5 Mg Tabs (Warfarin Sodium) .... As Diretced 3)  Nitroglycerin 0.4 Mg Subl (Nitroglycerin) .... One Tablet Under Tongue Every 5 Minutes As Needed For Chest Pain---May Repeat Times Three 4)  Simvastatin 40 Mg Tabs (Simvastatin) .... Take One Tablet By Mouth Daily At Bedtime 5)  Cardizem Cd 300 Mg Xr24h-Cap (Diltiazem Hcl Coated Beads) .... One Tablet By Mouth Once Daily  Allergies: No Known Drug Allergies  Anticoagulation Management History:      The patient is taking warfarin and comes in today for a routine follow up visit.  Positive risk factors for bleeding include an age of 66 years or older.  The bleeding index is 'intermediate risk'.  Positive CHADS2 values include History of HTN.  Negative CHADS2 values include Age > 64 years old.  The start date was 10/30/2007.  His last INR was 2.0 RATIO.  Anticoagulation responsible provider: Eden Emms MD, Theron Arista.  INR POC: 2.3.  Cuvette Lot#: [id number].  Exp: 10/2010.    Anticoagulation Management Assessment/Plan:      The patient's current anticoagulation dose is Warfarin sodium 5 mg tabs: as diretced.  The target INR is 2 - 3.  The next INR is due 08/29/2009.  Anticoagulation instructions were given to patient.  Results were reviewed/authorized by Weston Brass, PharmD.  He was  notified by Weston Brass PharmD.         Prior Anticoagulation Instructions: INR 2.4  Continue on same dosage 7.5mg  daily except 5mg  on Mondays and Thursdays.  Recheck in 6 weeks.    Current Anticoagulation Instructions: INR 2.3  Continue same dose of 1 1/2 tablet every day except 1 tablet on Monday and Thursday

## 2010-04-10 NOTE — Medication Information (Signed)
Summary: rov/ewj  Anticoagulant Therapy  Managed by: Cloyde Reams, RN, BSN Referring MD: Olga Millers MD PCP: Scotty Court Supervising MD: Gala Romney MD, Reuel Boom Indication 1: Atrial Fibrillation (ICD-427.31) Lab Used: [facility] Site: [facility] INR POC 2.4 INR RANGE 2 - 3  Dietary changes: no    Health status changes: no    Bleeding/hemorrhagic complications: no    Recent/future hospitalizations: no    Any changes in medication regimen? no    Recent/future dental: no  Any missed doses?: no       Is patient compliant with meds? yes       Allergies (verified): No Known Drug Allergies  Anticoagulation Management History:      The patient is taking warfarin and comes in today for a routine follow up visit.  Positive risk factors for bleeding include an age of 66 years or older.  The bleeding index is 'intermediate risk'.  Positive CHADS2 values include History of HTN.  Negative CHADS2 values include Age > 51 years old.  The start date was 10/30/2007.  His last INR was 2.0 RATIO.  Anticoagulation responsible provider: Bensimhon MD, Reuel Boom.  INR POC: 2.4.  Cuvette Lot#: [id number].  Exp: 07/2010.    Anticoagulation Management Assessment/Plan:      The patient's current anticoagulation dose is Warfarin sodium 5 mg tabs: as diretced.  The target INR is 2 - 3.  The next INR is due 07/18/2009.  Anticoagulation instructions were given to patient.  Results were reviewed/authorized by Cloyde Reams, RN, BSN.  He was notified by Cloyde Reams RN.         Prior Anticoagulation Instructions: INR 2.6  Continue on same dosage 7.5mg  daily except 5mg  on Mondays and Thursdays.  Recheck in 6 weeks.    Current Anticoagulation Instructions: INR 2.4  Continue on same dosage 7.5mg  daily except 5mg  on Mondays and Thursdays.  Recheck in 6 weeks.

## 2010-04-10 NOTE — Progress Notes (Signed)
Summary: refill med  Phone Note From Pharmacy   Caller: Josie Mesa [phone] or [phone] Request: Speak with Nurse Details for Reason: question regarding CARDIZEM CD 180 MG XR24H-CAP Take 1 capsule by mouth as directed Initial call taken by: Lorne Skeens,  May 15, 2009 2:14 PM  Follow-up for Phone Call        tried to call pharmacy but a pharmacist would not pick the phone up will call again Follow-up by: Kem Parkinson,  May 15, 2009 3:48 PM  Additional Follow-up for Phone Call Additional follow up Details #1::        cardizem 240 mg increased by Dr. Scotty Court gave pt refills told them to ignore the cardizem 180 Additional Follow-up by: Kem Parkinson,  May 15, 2009 4:13 PM

## 2010-04-10 NOTE — Medication Information (Signed)
Summary: rov/tm  Anticoagulant Therapy  Managed by: Cloyde Reams, RN, BSN Referring MD: Olga Millers MD PCP: Jeanmarie Hubert MD: Clifton James MD, Cristal Deer Indication 1: Atrial Fibrillation (ICD-427.31) Lab Used: [facility] Site: [facility] INR POC 3.2 INR RANGE 2 - 3  Dietary changes: yes       Details: Incr vit K intake.   Health status changes: no    Bleeding/hemorrhagic complications: no    Recent/future hospitalizations: no    Any changes in medication regimen? no    Recent/future dental: no  Any missed doses?: no       Is patient compliant with meds? yes       Allergies: No Known Drug Allergies  Anticoagulation Management History:      The patient is taking warfarin and comes in today for a routine follow up visit.  Positive risk factors for bleeding include an age of 66 years or older.  The bleeding index is 'intermediate risk'.  Positive CHADS2 values include History of HTN.  Negative CHADS2 values include Age > 78 years old.  The start date was 10/30/2007.  His last INR was 2.0 RATIO.  Anticoagulation responsible Valyncia Wiens: Clifton James MD, Cristal Deer.  INR POC: 3.2.  Cuvette Lot#: [id number].  Exp: 01/2011.    Anticoagulation Management Assessment/Plan:      The patient's current anticoagulation dose is Warfarin sodium 5 mg tabs: as diretced.  The target INR is 2 - 3.  The next INR is due 01/02/2010.  Anticoagulation instructions were given to patient.  Results were reviewed/authorized by Cloyde Reams, RN, BSN.  He was notified by Cloyde Reams RN.         Prior Anticoagulation Instructions: INR 3.1 Tomorrow take only 1/2 pill then continue 1.5 pills everyday except 1 pill on Mondays and Thursdays. Recheck in 6 weeks.   Current Anticoagulation Instructions: INR 3.2  Start taking 7.5mg  daily except 5mg  on Mondays, Thursdays, and Saturdays.  Recheck in 6 weeks.

## 2010-04-10 NOTE — Letter (Signed)
Summary: Pre Visit Letter Revised  [facility]  [address] [place], [place] [address]   Phone: [phone]  Fax: [phone]        12/25/2009 MRN: [id number] [name] [address] [place], [place]  [address]             Procedure Date:  02-05-10   Welcome to the Gastroenterology Division at [facility].    You are scheduled to see a nurse for your pre-procedure visit on 01-03-10 at 10:00a.m. on the 3rd floor at [facility], [address]  We ask that you try to arrive at our office 15 minutes prior to your appointment time to allow for check-in.  Please take a minute to review the attached form.  If you answer "Yes" to one or more of the questions on the first page, we ask that you call the person listed at your earliest opportunity.  If you answer "No" to all of the questions, please complete the rest of the form and bring it to your appointment.    Your nurse visit will consist of discussing your medical and surgical history, your immediate family medical history, and your medications.   If you are unable to list all of your medications on the form, please bring the medication bottles to your appointment and we will list them.  We will need to be aware of both prescribed and over the counter drugs.  We will need to know exact dosage information as well.    Please be prepared to read and sign documents such as consent forms, a financial agreement, and acknowledgement forms.  If necessary, and with your consent, a friend or relative is welcome to sit-in on the nurse visit with you.  Please bring your insurance card so that we may make a copy of it.  If your insurance requires a referral to see a specialist, please bring your referral form from your primary care physician.  No co-pay is required for this nurse visit.     If you cannot keep your appointment, please call [phone] to cancel or reschedule prior to your appointment date.  This  allows [place] the opportunity to schedule an appointment for another patient in need of care.    Thank you for choosing [facility] for your medical needs.  We appreciate the opportunity to care for you.  Please visit [place] at our website  to learn more about our practice.  Sincerely, The Gastroenterology Division

## 2010-04-10 NOTE — Miscellaneous (Signed)
Summary: [facility] Previsit/prep  Clinical Lists Changes Pt. on coumadin. Previsit aborted and office visit scheduled.

## 2010-04-10 NOTE — Progress Notes (Signed)
Summary: Surgery Clearance  Phone Note Outgoing Call   Summary of Call: Left a message for pt to return my call. Patient needs appt for surgery clearace. Initial call taken by: Josph Macho RMA,  December 18, 2009 10:33 AM  Follow-up for Phone Call        Appt scheduled for 12/20/09 @ 9:15 Follow-up by: Trixie Dredge,  December 18, 2009 12:26 PM

## 2010-04-10 NOTE — Op Note (Signed)
Summary: Colonoscopy and biopsy    TNAME:  [name], [name]                        ACCOUNT NO.:  [id number]   MEDICAL RECORD NO.:  [id number]                   PATIENT TYPE:  AMB   LOCATION:  ENDO                                 FACILITY:  [facility]   PHYSICIAN:  Georgiana Spinner, M.D.                 DATE OF BIRTH:  1945-06-16   DATE OF PROCEDURE:  11/16/2001  DATE OF DISCHARGE:                                 OPERATIVE REPORT   PROCEDURE:  Colonoscopy.   INDICATIONS:  Colon cancer screening, rectal bleeding, colon polyp.   ANESTHESIA:  Demerol 40, Versed 4 mg.   DESCRIPTION OF PROCEDURE:  With patient mildly sedated in the left lateral  decubitus position, a rectal exam was performed which was unremarkable.  Subsequently, the Olympus videoscopic colonoscope was inserted in the rectum  and passed under direct vision to the cecum, and the prep was good to  excellent.  From this point, the colonoscope was slowly withdrawn, taking  circumferential views of the entire colonic mucosa, stopping only at  approximately 50 cm from the rectum, at which point two small polyps were  seen, photographed, and removed using hot biopsy forceps technique, setting  of 20-20 blended current.  This was done until we reached the rectum which  appeared normal on direct and showed small hemorrhoids on retroflexed view.  The endoscope was straightened and withdrawn.  The patient's vital signs and  pulse oximeter remained stable.  The patient tolerated the procedure well  without apparent complications.   FINDINGS:  1. Polyps at 50 cm.  2. Internal hemorrhoids.  3. Otherwise unremarkable exam.   PLAN:  1. Await biopsy report.  2. The patient will call me for results and follow up with me as an     outpatient.        SP Surgical Pathology - STATUS: Final             By: Guilford Shi MD , Clovis Pu       Perform Date: 8 Sep03 07:37  Ordered By: Jarvis Newcomer,            Ordered Date:  Facility: [facility]                               Department: CPATH  Service Report Text  [facility]   [address] [place], [place] [address]   [phone]    REPORT OF SURGICAL PATHOLOGY    Case #: [id number]   Patient Name: [name], [name]   PID: [id number]   Pathologist: Marcie Bal, MD   DOB/Age 03/20/1945 (Age: 6) Gender: M   Date Taken: 11/16/2001   Date Received: 11/16/2001    FINAL DIAGNOSIS    ***MICROSCOPIC EXAMINATION AND DIAGNOSIS***    COLON: HYPERPLASTIC POLYP. NO ADENOMATOUS  CHANGE OR MALIGNANCY   IDENTIFIED.    jn   Date Reported: 11/17/2001 Marcie Bal, MD   *** Electronically Signed Out By TAZ ***    specimen(s) obtained   Colon, polyp @ 50 cm    Gross Description   Received in formalin are tan, soft tissue fragments that are   submitted in toto. Number: 2   Size: 0.3 and 0.4 cm. (GP:jn, 11/16/01)    jn/                                                Georgiana Spinner, M.D.    GMO/MEDQ  D:  11/16/2001  T:  11/16/2001  Job:  [id number]   cc:   Lacretia Leigh. Quintella Reichert, M.D.

## 2010-04-10 NOTE — Letter (Signed)
Summary: Records from [facility] 2003 - 2010  Records from [facility] 2003 - 2010   Imported By: Maryln Gottron 03/31/2009 15:07:34  _____________________________________________________________________  External Attachment:    Type:   Image     Comment:   External Document

## 2010-04-10 NOTE — Letter (Signed)
Summary: Pre Visit Letter Revised  [facility]  [address] [place], [place] [address]   Phone: [phone]  Fax: [phone]        12/25/2009 MRN: [id number] [name] [address] [place], [place]  [address]             Procedure Date:  01-16-10   Welcome to the Gastroenterology Division at [facility]    You are scheduled to see a nurse for your pre-procedure visit on 01-03-10 at 10:00a.m. on the 3rd floor at [facility], [address]  We ask that you try to arrive at our office 15 minutes prior to your appointment time to allow for check-in.  Please take a minute to review the attached form.  If you answer "Yes" to one or more of the questions on the first page, we ask that you call the person listed at your earliest opportunity.  If you answer "No" to all of the questions, please complete the rest of the form and bring it to your appointment.    Your nurse visit will consist of discussing your medical and surgical history, your immediate family medical history, and your medications.   If you are unable to list all of your medications on the form, please bring the medication bottles to your appointment and we will list them.  We will need to be aware of both prescribed and over the counter drugs.  We will need to know exact dosage information as well.    Please be prepared to read and sign documents such as consent forms, a financial agreement, and acknowledgement forms.  If necessary, and with your consent, a friend or relative is welcome to sit-in on the nurse visit with you.  Please bring your insurance card so that we may make a copy of it.  If your insurance requires a referral to see a specialist, please bring your referral form from your primary care physician.  No co-pay is required for this nurse visit.     If you cannot keep your appointment, please call [phone] to cancel or reschedule prior to your appointment date.  This allows  [place] the opportunity to schedule an appointment for another patient in need of care.    Thank you for choosing [facility] for your medical needs.  We appreciate the opportunity to care for you.  Please visit [place] at our website  to learn more about our practice.  Sincerely, The Gastroenterology Division

## 2010-04-10 NOTE — Assessment & Plan Note (Signed)
Summary: discuss colon on BT--ch.   History of Present Illness Visit Type: consult Primary GI MD: Elie Goody MD [facility] Primary Provider: Toni Amend, MD Requesting Provider: Toni Amend, MD Chief Complaint: discuss colonoscopy, pt takes warfarin History of Present Illness:   This is a 65 year old male here today with his wife, who has had problems with constipation over the past one to 2 years. It is now under good control on fiber supplements, a high-fiber diet, and stool softeners. Decrease in colonoscopy by Dr. Sabino Gasser, in 2003, with findings of hyperplastic polyps and in 07/2008, which showed one hyperplastic polyp, and one adenomatous polyp. Dr. Virginia Rochester, recommended a three-year surveillance colonoscopy. Mr. Alverio, as, atrial fibrillation, chronic Coumadin anticoagulation, a prior myocardial infarction, hyperlipidemia, and hypertension, which are all stable.   GI Review of Systems    Reports bloating.      Denies abdominal pain, acid reflux, belching, chest pain, dysphagia with liquids, dysphagia with solids, heartburn, loss of appetite, nausea, vomiting, vomiting blood, weight loss, and  weight gain.        Denies anal fissure, black tarry stools, change in bowel habit, constipation, diarrhea, diverticulosis, fecal incontinence, heme positive stool, hemorrhoids, irritable bowel syndrome, jaundice, light color stool, liver problems, rectal bleeding, and  rectal pain.   Current Medications (verified): 1)  Aspirin 81 Mg  Tabs (Aspirin) .... Take 2 Tablet By Mouth Once A Day 2)  Warfarin Sodium 5 Mg Tabs (Warfarin Sodium) .... As Diretced 3)  Nitroglycerin 0.4 Mg Subl (Nitroglycerin) .... One Tablet Under Tongue Every 5 Minutes As Needed For Chest Pain---May Repeat Times Three 4)  Cardizem Cd 300 Mg Xr24h-Cap (Diltiazem Hcl Coated Beads) .... One Tablet By Mouth Once Daily 5)  Lisinopril 40 Mg Tabs (Lisinopril) .Marland Kitchen.. 1 Tab By Mouth Once Daily 6)  Lovastatin 20 Mg Tabs  (Lovastatin) .Marland Kitchen.. 1 Tab By Mouth At Bedtime 7)  Multivitamins  Tabs (Multiple Vitamin) .... Take 1 Tablet By Mouth Once A Day 8)  Vitamin E 400 Unit Caps (Vitamin E) .... Take 1 Capsule By Mouth Once A Day 9)  Super Garlic 1000 Mg Caps (Garlic) .... Take 2 Tablets By Mouth Daily 10)  Alfalfa 500 Mg Tabs (Alfalfa) .... Take 2 Tablet By Mouth Daily 11)  Magnesium 300 Mg Caps (Magnesium) .... Take 1 Capsule By Mouth Once A Day 12)  Calcium Carbonate-Vitamin D 600-200 Mg-Unit Caps (Calcium Carbonate-Vitamin D) .... Take 1 Tablet By Mouth Two Times A Day 13)  Vitamin C 500 Mg Tabs (Ascorbic Acid) .... Take 1 Tablet By Mouth Once A Day  Allergies (verified): No Known Drug Allergies  Past History:  Past Medical History: Reviewed history from 02/05/2010 and no changes required. 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 All other ROS were reviewed and were negative.   Vital Signs:  Patient profile:   65 year old male Height:      64 inches Weight:      176 pounds BMI:     30.32 Pulse rate:   84 / minute  Pulse rhythm:   irregular BP sitting:   142 / 94  (left arm) Cuff size:   regular  Vitals Entered By: Francee Piccolo CMA Duncan Dull) (February 05, 2010 9:52 AM)  Physical Exam  General:  Well developed, well nourished, no acute distress. Head:  Normocephalic and atraumatic. Eyes:  PERRLA, no icterus. Ears:  decreased auditory acuity.   Mouth:  No  deformity or lesions, dentition normal. Neck:  Supple; no masses or thyromegaly. Lungs:  Clear throughout to auscultation. Heart:  Regular rate and rhythm; no murmurs, rubs,  or bruits. Abdomen:  Soft, nontender and nondistended. No masses, hepatosplenomegaly or hernias noted. Normal bowel sounds. Neurologic:  Alert and  oriented x4;  grossly normal neurologically. Cervical Nodes:  No significant cervical adenopathy. Psych:  Alert and cooperative. Normal mood and affect.  Impression & Recommendations:  Problem # 1:  CONSTIPATION (ICD-564.00) Chronic constipation, well-controlled on Metamucil, high-fiber diet, stool softeners and increased water intake. Continue current management.  Problem # 2:  PERSONAL HX COLONIC POLYPS (ICD-V12.72) Personal history of adenomatous colon polyps, initially diagnosed in May 2010. I discussed the current guidelines for a 5 year surveillance colonoscopy for a single, small, adenomatous colon polyp. He is quite concerned about his history of a precancerous colon polyp and would prefer to stay with the 3 year interval recommendations made by Dr. Virginia Rochester. Plan for surveillance colonoscopy, May 2013.  Problem # 3:  COUMADIN THERAPY (ICD-V58.61)  Patient Instructions: 1)  We will contact you in 07/2011 when it is time for your colonoscopy.  2)  Copy sent to : Dianna Limbo, MD 3)  The medication list was reviewed and reconciled.  All changed / newly prescribed medications were explained.  A complete medication list was provided to the patient / caregiver.  Appended Document: discuss colon on BT--ch. reviewed

## 2010-04-10 NOTE — Assessment & Plan Note (Signed)
Summary: follow up on blood pressure/per Dr. Marlon Pel   Vital Signs:  Patient profile:   65 year old male Weight:      180 pounds O2 Sat:      98 % Temp:     98 degrees F Pulse rate:   94 / minute BP sitting:   140 / 100  (left arm) Cuff size:   large  Vitals Entered By: Pura Spice, RN (May 11, 2009 1:42 PM) CC: talk about labs ? aout chlolesterol has appt with Dr Jens Som on May 24 2009    History of Present Illness: this 65 year old afroamerican is in today to followup on hypertension as well as to discuss his elevated lipids he has bipolar Dr. Lawanna Kobus March 7 but decided to come in today for stent discuss it appeared blood pressure repeated today was 130/94. 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 from atrial fibrillation Hypertension Arthritis Ulcers hyperlipidemia Colonic polyps, hx of  Hard-of-hearing  Social History: Last updated: 09/23/2006 Retired Married Former Smoker Alcohol use-yes Drug use-no Regular exercise-yes  Risk Factors: Smoking Status: quit (09/23/2006)  Review of Systems  The patient denies anorexia, fever, weight loss, weight gain, vision loss, decreased hearing, hoarseness, chest pain, syncope, dyspnea on exertion, peripheral edema, prolonged cough, headaches, hemoptysis, abdominal pain, melena, hematochezia, severe indigestion/heartburn, hematuria, incontinence, genital sores, muscle weakness, suspicious skin lesions, transient blindness, difficulty walking, depression, unusual weight change, abnormal bleeding, enlarged lymph nodes, angioedema, breast masses, and testicular masses.      Physical Exam  General:  Well-developed,well-nourished,in no acute distress; alert,appropriate and cooperative throughout examination Lungs:  Normal respiratory effort, chest expands symmetrically. Lungs are clear to auscultation, no crackles or wheezes. Heart:  Normal rate and regular rhythm. S1 and S2 normal without gallop, murmur, click, rub or other extra sounds. Abdomen:  Bowel sounds positive,abdomen soft and non-tender without masses, organomegaly or hernias noted. Extremities:  No clubbing, cyanosis, edema, or deformity noted with normal full range of motion of all joints.     Impression & Recommendations:  Problem # 1:  HYPERLIPIDEMIA (ICD-272.4) Assessment Deteriorated  The following medications were removed from the medication list:    Zetia 10 Mg Tabs (Ezetimibe) ..... One q.d. for lipid treatment His updated medication list for this problem includes:    Lipitor 40 Mg Tabs (Atorvastatin calcium) .Marland Kitchen... 1  qd  Problem # 2:  HYPERTENSION (ICD-401.9) Assessment: Deteriorated  The following medications were removed from the medication list:    Cardizem Cd 180 Mg Xr24h-cap (Diltiazem hcl coated beads) .Marland Kitchen... Take 1 capsule by mouth as directed    Zestril 40 Mg Tabs (Lisinopril) .Marland Kitchen... 1  each day    Cardizem La 240 Mg Xr24h-tab (Diltiazem hcl coated beads) .Marland Kitchen... 1 eacxh day His updated medication list for this problem includes:    Cardizem Cd 240 Mg Xr24h-cap (Diltiazem hcl coated beads) .Marland Kitchen... 1 by mouth once daily  Orders: Doppler Referral (Doppler) Prescription Created Electronically [phone])  Problem # 3:  COUMADIN THERAPY (ICD-V58.61) Assessment: Unchanged  Problem # 4:  DECREASED HEARING, BILATERAL (ICD-389.9) Assessment: Unchanged  Problem # 5:  MYOCARDIAL INFARCTION (ICD-410.90) Assessment: Improved  The following medications were removed from  the medication list:    Cardizem Cd 180 Mg Xr24h-cap (Diltiazem hcl coated beads) .Marland Kitchen... Take 1 capsule by mouth as  directed    Zestril 40 Mg Tabs (Lisinopril) .Marland Kitchen... 1  each day    Cardizem La 240 Mg Xr24h-tab (Diltiazem hcl coated beads) .Marland Kitchen... 1 eacxh day His updated medication list for this problem includes:    Aspirin 81 Mg Tabs (Aspirin) .Marland Kitchen... Take 1 tablet by mouth once a day    Nitroglycerin 0.4 Mg Subl (Nitroglycerin) ..... One tablet under tongue every 5 minutes as needed for chest pain---may repeat times three    Cardizem Cd 240 Mg Xr24h-cap (Diltiazem hcl coated beads) .Marland Kitchen... 1 by mouth once daily  Complete Medication List: 1)  Aspirin 81 Mg Tabs (Aspirin) .... Take 1 tablet by mouth once a day 2)  Warfarin Sodium 5 Mg Tabs (Warfarin sodium) .... As diretced 3)  Nitroglycerin 0.4 Mg Subl (Nitroglycerin) .... One tablet under tongue every 5 minutes as needed for chest pain---may repeat times three 4)  Lipitor 40 Mg Tabs (Atorvastatin calcium) .Marland Kitchen.. 1  qd 5)  Cardizem Cd 240 Mg Xr24h-cap (Diltiazem hcl coated beads) .Marland Kitchen.. 1 by mouth once daily  Patient Instructions: 1)  Pt had stopped lipitor because of thigh pain or muscle aches 2)  since lipids are elevated to restart and see if muscle aches return 3)  Talk to Dr. Jens Som about lipids 4)  To have carotid dopplers , 5)  to increase cardiozme to 240 mg Prescriptions: CARDIZEM CD 240 MG XR24H-CAP (DILTIAZEM HCL COATED BEADS) 1 by mouth once daily  #30 x 11   Entered by:   Pura Spice, RN   Authorized by:   Judithann Sheen MD   Signed by:   Pura Spice, RN on 05/16/2009   Method used:   Electronically to        [facility] [phone]* (retail)       [address]       [place], [place]  [address]       Ph: [phone]       Fax: [phone]   RxID:   [id number] CARDIZEM LA 240 MG XR24H-TAB (DILTIAZEM HCL COATED BEADS) 1 eacxh day  #30 x 30   Entered and Authorized by:   Judithann Sheen MD   Signed by:   Judithann Sheen MD on 05/11/2009   Method used:   Electronically to        [facility] [phone]*  (retail)       [address]       [place], [place]  [address]       Ph: [phone]       Fax: [phone]   RxID:   [id number]

## 2010-04-12 NOTE — Medication Information (Signed)
Summary: rov/tm  Anticoagulant Therapy  Managed by: Bethena Midget, RN, BSN Referring MD: Olga Millers MD PCP: Toni Amend, MD Supervising MD: Riley Kill MD, Maisie Fus Indication 1: Atrial Fibrillation (ICD-427.31) Lab Used: [facility] Site: [facility] INR POC 2.3 INR RANGE 2 - 3  Dietary changes: no    Health status changes: no    Bleeding/hemorrhagic complications: no    Recent/future hospitalizations: no    Any changes in medication regimen? no    Recent/future dental: no  Any missed doses?: no       Is patient compliant with meds? yes       Allergies: No Known Drug Allergies  Anticoagulation Management History:      The patient is taking warfarin and comes in today for a routine follow up visit.  Positive risk factors for bleeding include an age of 66 years or older.  The bleeding index is 'intermediate risk'.  Positive CHADS2 values include History of HTN.  Negative CHADS2 values include Age > 30 years old.  The start date was 10/30/2007.  His last INR was 2.0 RATIO.  Anticoagulation responsible provider: Riley Kill MD, Maisie Fus.  INR POC: 2.3.  Cuvette Lot#: [id number].  Exp: 04/2011.    Anticoagulation Management Assessment/Plan:      The patient's current anticoagulation dose is Warfarin sodium 5 mg tabs: as diretced.  The target INR is 2 - 3.  The next INR is due 05/08/2010.  Anticoagulation instructions were given to patient.  Results were reviewed/authorized by Bethena Midget, RN, BSN.  He was notified by Bethena Midget, RN, BSN.         Prior Anticoagulation Instructions: INR 2.1 Continue 7.5mg s everyday except 5mg s on Mondays, Thursdays and Saturdays. Recheck in 6 weeks.   Current Anticoagulation Instructions: INR 2.3 Continue 1.5 pill everyday except 1 pill on Mondays, Thursdays and Saturdays. Recheck in 6 weeks.

## 2010-04-12 NOTE — Letter (Signed)
Summary: [facility]  [facility]   Imported By: Maryln Gottron 03/29/2010 13:54:23  _____________________________________________________________________  External Attachment:    Type:   Image     Comment:   External Document

## 2010-04-18 ENCOUNTER — Telehealth (INDEPENDENT_AMBULATORY_CARE_PROVIDER_SITE_OTHER): Payer: Self-pay | Admitting: *Deleted

## 2010-04-18 NOTE — Progress Notes (Signed)
Summary: Lovenox bridge for procedure.   Phone Note Outgoing Call   Call placed by: Weston Brass PharmD,  April 10, 2010 2:57 PM Call placed to: Patient Summary of Call: Pt has surgery pending on 04/30/10.  Per Dr. Jens Som, pt will need Lovenox bridge.  Attempted to reach pt with information.  LMOM for him to call back. Weston Brass PharmD  April 10, 2010 2:58 PM   Follow-up for Phone Call        Pt having operation on ears.  Has never done Lovenox injections before so will have pt come in to go over instructions and Lovenox teaching.  Pt appt made for 2/13.  Follow-up by: Weston Brass PharmD,  April 11, 2010 10:15 AM

## 2010-04-24 ENCOUNTER — Other Ambulatory Visit: Payer: Self-pay | Admitting: Family Medicine

## 2010-04-25 ENCOUNTER — Other Ambulatory Visit (HOSPITAL_COMMUNITY): Payer: Self-pay

## 2010-04-26 ENCOUNTER — Telehealth: Payer: Self-pay | Admitting: Family Medicine

## 2010-04-26 NOTE — Telephone Encounter (Signed)
Pts wife called to adv pt needs refill on med: lovastatin 20mg ..... [facility] [address].

## 2010-04-26 NOTE — Progress Notes (Signed)
  Phone Note Call from Patient Call back at Home Phone [phone]   Caller: Spouse Reason for Call: Talk to Nurse Initial call taken by: Roe Coombs,  April 18, 2010 2:34 PM  Follow-up for Phone Call        Spoke with pt wife and his Sx has been R/s from 2/20 to 3/7. Thus Pre-Lovenox appt Cx and he is to keep his 2/28 appt and Lovenox will be educated on that visit.  Follow-up by: Bethena Midget, RN, BSN,  April 18, 2010 2:43 PM

## 2010-04-27 ENCOUNTER — Telehealth: Payer: Self-pay | Admitting: Family Medicine

## 2010-04-27 DIAGNOSIS — E785 Hyperlipidemia, unspecified: Secondary | ICD-10-CM

## 2010-04-27 MED ORDER — LOVASTATIN 20 MG PO TABS: 20.0000 mg | ORAL_TABLET | Freq: Every day | ORAL | Status: DC

## 2010-04-27 NOTE — Telephone Encounter (Signed)
rx sent in electronically 

## 2010-04-27 NOTE — Telephone Encounter (Signed)
To refill lovastatin

## 2010-04-27 NOTE — Telephone Encounter (Signed)
Pt needs refill on med: lovastatin 20mg ...... [facility] [place] [place].

## 2010-05-07 ENCOUNTER — Encounter: Payer: Self-pay | Admitting: Cardiology

## 2010-05-07 DIAGNOSIS — I4891 Unspecified atrial fibrillation: Secondary | ICD-10-CM

## 2010-05-08 ENCOUNTER — Encounter (INDEPENDENT_AMBULATORY_CARE_PROVIDER_SITE_OTHER): Payer: Medicare Other

## 2010-05-08 ENCOUNTER — Encounter: Payer: Self-pay | Admitting: Cardiology

## 2010-05-08 DIAGNOSIS — I4891 Unspecified atrial fibrillation: Secondary | ICD-10-CM

## 2010-05-08 DIAGNOSIS — Z7901 Long term (current) use of anticoagulants: Secondary | ICD-10-CM

## 2010-05-14 ENCOUNTER — Other Ambulatory Visit (HOSPITAL_COMMUNITY): Payer: No Typology Code available for payment source

## 2010-05-14 ENCOUNTER — Encounter (HOSPITAL_COMMUNITY)
Admission: RE | Admit: 2010-05-14 | Discharge: 2010-05-14 | Disposition: A | Discharge status: Home / Self Care | Payer: Medicare Other | Source: Ambulatory Visit | Attending: Otolaryngology | Admitting: Otolaryngology

## 2010-05-14 DIAGNOSIS — Z01812 Encounter for preprocedural laboratory examination: Secondary | ICD-10-CM | POA: Insufficient documentation

## 2010-05-14 DIAGNOSIS — Z01810 Encounter for preprocedural cardiovascular examination: Secondary | ICD-10-CM | POA: Insufficient documentation

## 2010-05-14 DIAGNOSIS — H9190 Unspecified hearing loss, unspecified ear: Secondary | ICD-10-CM | POA: Insufficient documentation

## 2010-05-14 DIAGNOSIS — Z01818 Encounter for other preprocedural examination: Secondary | ICD-10-CM | POA: Insufficient documentation

## 2010-05-14 LAB — BASIC METABOLIC PANEL
BUN: 13 mg/dL (ref 6–23)
Calcium: 10 mg/dL (ref 8.4–10.5)
Creatinine, Ser: 1.11 mg/dL (ref 0.4–1.5)
GFR calc Af Amer: >60 mL/min (ref >60)
GFR calc non Af Amer: >60 mL/min (ref >60)

## 2010-05-14 LAB — PROTIME-INR
INR: 1.32 (ref 0.00–1.49)
Prothrombin Time: 16.6 seconds — ABNORMAL HIGH (ref 11.6–15.2)

## 2010-05-14 LAB — DIFFERENTIAL
Basophils Relative: 0 % (ref 0–1)
Eosinophils Absolute: 0.1 10*3/uL (ref 0.0–0.7)
Eosinophils Relative: 1 % (ref 0–5)
Lymphs Abs: 3 10*3/uL (ref 0.7–4.0)
Monocytes Relative: 8 % (ref 3–12)
Neutrophils Relative %: 64 % (ref 43–77)

## 2010-05-14 LAB — CBC
MCH: 33.1 pg (ref 26.0–34.0)
MCV: 94.1 fL (ref 78.0–100.0)
Platelets: 201 10*3/uL (ref 150–400)
RDW: 11.8 % (ref 11.5–15.5)

## 2010-05-16 ENCOUNTER — Ambulatory Visit (HOSPITAL_COMMUNITY): Payer: Medicare Other

## 2010-05-16 ENCOUNTER — Ambulatory Visit (HOSPITAL_COMMUNITY)
Admission: RE | Admit: 2010-05-16 | Discharge: 2010-05-17 | Disposition: A | Discharge status: Home / Self Care | Payer: Medicare Other | Source: Ambulatory Visit | Attending: Otolaryngology | Admitting: Otolaryngology

## 2010-05-16 DIAGNOSIS — E039 Hypothyroidism, unspecified: Secondary | ICD-10-CM | POA: Insufficient documentation

## 2010-05-16 DIAGNOSIS — I4891 Unspecified atrial fibrillation: Secondary | ICD-10-CM | POA: Insufficient documentation

## 2010-05-16 DIAGNOSIS — I2510 Atherosclerotic heart disease of native coronary artery without angina pectoris: Secondary | ICD-10-CM | POA: Insufficient documentation

## 2010-05-16 DIAGNOSIS — N486 Induration penis plastica: Secondary | ICD-10-CM | POA: Insufficient documentation

## 2010-05-16 DIAGNOSIS — E785 Hyperlipidemia, unspecified: Secondary | ICD-10-CM | POA: Insufficient documentation

## 2010-05-16 DIAGNOSIS — Z860100 Personal history of colon polyps, unspecified: Secondary | ICD-10-CM | POA: Insufficient documentation

## 2010-05-16 DIAGNOSIS — I10 Essential (primary) hypertension: Secondary | ICD-10-CM

## 2010-05-16 DIAGNOSIS — H903 Sensorineural hearing loss, bilateral: Secondary | ICD-10-CM | POA: Insufficient documentation

## 2010-05-16 DIAGNOSIS — Z01812 Encounter for preprocedural laboratory examination: Secondary | ICD-10-CM | POA: Insufficient documentation

## 2010-05-16 DIAGNOSIS — I252 Old myocardial infarction: Secondary | ICD-10-CM | POA: Insufficient documentation

## 2010-05-17 NOTE — Medication Information (Signed)
Summary: ROV/TM/GLC  Anticoagulant Therapy  Managed by: Tammy Sours, PharmD Referring MD: Olga Millers MD PCP: Toni Amend, MD Supervising MD: Shirlee Latch MD, Fitzroy Mikami Indication 1: Atrial Fibrillation (ICD-427.31) Lab Used: [facility] Site: [facility] INR POC 2.1 INR RANGE 2 - 3  Dietary changes: no    Health status changes: no    Bleeding/hemorrhagic complications: no    Recent/future hospitalizations: yes       Details: Patient having surgery on 3/7. Will need lovenox bridge.   Any changes in medication regimen? no    Recent/future dental: no  Any missed doses?: no       Is patient compliant with meds? yes       Allergies: No Known Drug Allergies  Anticoagulation Management History:      The patient is taking warfarin and comes in today for a routine follow up visit.  Positive risk factors for bleeding include an age of 66 years or older.  The bleeding index is 'intermediate risk'.  Positive CHADS2 values include History of HTN.  Negative CHADS2 values include Age > 40 years old.  The start date was 10/30/2007.  His last INR was 2.0 RATIO.  Anticoagulation responsible provider: Shirlee Latch MD, Mckenna Boruff.  INR POC: 2.1.  Cuvette Lot#: [id number].  Exp: 04/2011.    Anticoagulation Management Assessment/Plan:      The patient's current anticoagulation dose is Warfarin sodium 5 mg tabs: as diretced.  The target INR is 2 - 3.  The next INR is due 05/21/2010.  Anticoagulation instructions were given to patient.  Results were reviewed/authorized by Tammy Sours, PharmD.  He was notified by Tammy Sours PharmD.         Prior Anticoagulation Instructions: INR 2.3 Continue 1.5 pill everyday except 1 pill on Mondays, Thursdays and Saturdays. Recheck in 6 weeks.   Current Anticoagulation Instructions: INR 2.1  Take last coumadin dose on 05/10/10. Start Lovenox 120 mg injection on 05/12/10 once daily in the morning around 7am until 05/15/10 which is your last injection. Restart  coumdain and Lovenox injection per surgeon's instructions. When surgeon says its ok to take Coumadin, take 1 and 1/2 tablets for 3 days and then resume normal schedule of 1 and 1/2 tablets everyday except 1 tablet on Mondays, Thursdays, and Saturdays. Restat Lovenox per surgeons instructions once daily in the AM until come back to clinic.  Reheck INR on March 12.  Prescriptions: LOVENOX 120 MG/0.8ML SOLN (ENOXAPARIN SODIUM) Take 120mg s Subcutaneously every Morning at 7am.  #10 x 0   Entered by:   Tammy Sours PharmD   Authorized by:   Ferman Hamming, MD, [facility]   Signed by:   Tammy Sours PharmD on 05/08/2010   Method used:   Electronically to        [facility] [phone]* (retail)       [address]       [place], [place]  [address]       Ph: [phone]       Fax: [phone]   RxID:   [id number]

## 2010-05-18 NOTE — Consult Note (Signed)
[name], [name] NO.:  [id number]  MEDICAL RECORD NO.:  [id number]           PATIENT TYPE:  I  LOCATION:  3304                         FACILITY:  [facility]  PHYSICIAN:  Cassell Clement, M.D. DATE OF BIRTH:  03-Dec-1945  DATE OF CONSULTATION:  05/16/2010 DATE OF DISCHARGE:                                CONSULTATION   CARDIOLOGIST:  Madolyn Frieze. Jens Som, MD, [facility]  PRIMARY CARE PROVIDER:  Danise Edge, MD at [facility].  We were asked to see this pleasant 65 year old gentleman in regard to postoperative hypertension.  The patient was hospitalized and underwent a left cochlear implant procedure earlier today by Dr. Dorma Russell. Postoperatively, his blood pressures have been running high in the 165/97 range.  Normally, the patient takes Cardizem 300 mg CD each morning as well as lisinopril 40 mg each morning.  He did not take his medicine that day because of n.p.o. status for surgery.  The patient is in chronic atrial fibrillation.  He has been on warfarin and is followed at the [facility].  He takes 5 mg some days and 7.5 mg other days.  He stopped his warfarin 5 days ago.  His INR 2 days ago was down to 1.32.  The patient has a past history of an acute anteroseptal myocardial infarction on October 30, 2007, which was secondary to a coronary artery embolus in the setting of atrial fibrillation.  The patient has not been experiencing any postoperative chest pain or shortness of breath.  PAST MEDICAL HISTORY: 1. Hypothyroidism. 2. Hypercholesterolemia. 3. Chronic Coumadin therapy. 4. Decreased hearing requiring a cochlear implant.  FAMILY HISTORY:  Noncontributory.  SOCIAL HISTORY:  He is a former smoker.  He drinks some alcohol.  He does get regular exercise.  He quit smoking on September 23, 2006.  REVIEW OF SYSTEMS:  Otherwise, negative.  PHYSICAL EXAMINATION:  VITAL SIGNS:  His blood pressure is 165/97, the pulse is 106 in atrial fibrillation  and just a few minutes previously he received his daily dose of Cardizem CD. HEAD AND NECK:  Dressing over the left side of his head is dry and intact and no obvious bleeding.  The head and neck is otherwise unremarkable. CHEST:  Clear. HEART:  No murmur, gallop, rub, or click. ABDOMEN:  Soft and nontender. EXTREMITIES:  No phlebitis or edema.  An EKG done at 2015 shows atrial fibrillation with rapid ventricular response, but no ischemic changes.  He does have poor R-wave progression in V1 through V3 consistent with his known prior anteroseptal myocardial infarction.  IMPRESSION: 1. Postoperative hypertensive blood pressure response secondary to     n.p.o. status earlier in the day and not receiving his usual dose     of Cardizem this morning. 2. Chronic atrial fibrillation, presently off Coumadin for surgery     with INR 2 days ago being 1.32. 3. Remote history of anteroseptal myocardial infarction on October 30, 2007, felt to be secondary to a coronary artery embolus from atrial     fibrillation.  PLAN: 1. He has received his p.o. Cardizem which will peak in the next  several hours. 2. In the meantime, we will use labetalol 10 mg IV every 4 hours     p.r.n.  This will supplement the p.o. Cardizem.  We will start the     warfarin tonight in view of his prior cardiac history of coronary     artery embolus. 3. We would continue subcutaneous heparin as ordered and will not     start full-dose IV heparin tonight in view of his recent delicate     ear surgery. 4. Anticipate home in a.m. and he already has his instructions     concerning his Coumadin and he will be returning to the Coumadin     Clinic on May 21, 2010.          ______________________________ Cassell Clement, M.D.     TB/MEDQ  D:  05/16/2010  T:  05/17/2010  Job:  [id number]  Electronically Signed by Cassell Clement M.D. on 05/17/2010 04:19:13 PM

## 2010-05-21 ENCOUNTER — Encounter: Payer: Self-pay | Admitting: Cardiology

## 2010-05-21 ENCOUNTER — Encounter (INDEPENDENT_AMBULATORY_CARE_PROVIDER_SITE_OTHER): Payer: Medicare Other

## 2010-05-21 DIAGNOSIS — Z7901 Long term (current) use of anticoagulants: Secondary | ICD-10-CM

## 2010-05-21 DIAGNOSIS — I4891 Unspecified atrial fibrillation: Secondary | ICD-10-CM

## 2010-05-23 NOTE — Discharge Summary (Signed)
[name], [name] NO.:  [id number]  MEDICAL RECORD NO.:  [id number]           PATIENT TYPE:  I  LOCATION:  3304                         FACILITY:  [facility]  PHYSICIAN:  Carolan Shiver, M.D.    DATE OF BIRTH:  [birth year]  DATE OF ADMISSION:  05/16/2010 DATE OF DISCHARGE:  05/17/2010                              DISCHARGE SUMMARY   ADMISSION DIAGNOSES: 1. Profound post lingual sensorineural hearing loss, left ear. 2. Atrial fibrillation. 3. Status post anterior wall myocardial infarction secondary to     thrombus and secondary to atrial fibrillation. 4. Hypertension. 5. Hyperlipidemia. 6. Peyronie disease. 7. History of colonic polyps. 8. History of hypothyroidism.  DISCHARGE DIAGNOSES: 1. Profound post lingual sensorineural hearing loss, left ear. 2. Atrial fibrillation. 3. Status post anterior wall myocardial infarction secondary to     thrombus and secondary to atrial fibrillation. 4. Hypertension. 5. Hyperlipidemia. 6. Peyronie disease. 7. History of colonic polyps. 8. History of hypothyroidism.  OPERATION:  Nucleus freedom with contour advanced electrode cochlear implant, left ear.  SURGEON:  Carolan Shiver, MD  ANESTHESIA:  General endotracheal Dr. Adonis Huguenin.  COMPLICATIONS:  None.  DISCHARGE STATUS:  Stable.  SUMMARY OF HOSPITALIZATION:  [name] is a 65 year old African American male, hypertensive in atrial fibrillation status post anterior wall MI with profound bilateral post lingual sensorineural hearing loss. The patient developed progressive sensorineural hearing loss beginning at age 51.  He also was exposed to significant noise in the [place] war and working at the [employer].  He is at the point where he has inducible hearing and has been unable to benefit at all from hearing aids.  He has very poor discrimination in both ears less than 20% in a ears.  Because of this, he was recommended  for multichannel cochlear implant, left ear.  The patient underwent an extensive preoperative evaluation including H and P, CT scan of his temporal bones, which showed patent cochleas, electronystagmography, which showed symmetric caloric responses and caloric vestibular function in both ears, and Pneumovax vaccination.  In preparation for the procedure, the patient was taken off his Coumadin by the coagulation team at [facility].  He was being maintained on Coumadin because of the atrial fibrillation and history of thrombus causing the acute anterior wall MI in the past.  He did well off the anticoagulation.  On the morning of May 16, 2010, he was taken to [facility] operating room #3 and underwent an uncomplicated implantation of a nucleus freedom with contour advanced electrode cochlear implant.  A full-length insertion was accomplished and documented on an intraoperative x-ray.  The patient was admitted to the PACU then to 3300, room 3304, step-down unit, for overnight observation and cardiac monitoring.  During the first operative evening, the patient became hypertensive to 164/109.  He received 10 mg of IV labetalol.  He received his Cardizem in the morning and lisinopril in the evening.  I contacted Dr. Ward Givens of Cardiology who stopped by to see the patient in the evening. He took over his hypertensive management.  The patient had an uncomplicated  afebrile course overnight.  His blood pressure decreased to 125/64 by the first postoperative morning, May 17, 2010.  He was awake, alert, afebrile with stable vital signs in atrial fibrillation without chest pain, otalgia, or vertigo.  He was up walking in the hallways with his nurse and feeling well.  He was eating and drinking and urinating without difficulty.  Examination of his left cochlear implant incision showed the staples were stable.  There was no sign of any hematoma and he had no nystagmus and facial  function was intact and symmetric.  The patient was recommended for discharge on the morning of May 17, 2010, with his wife.  He was instructed to return to my office on May 24, 2010, at 1:50 p.m. for followup and staple removal.  External sound processor application and programming will be in approximately 4 weeks.  At the time of hospitalization, preop lab data was within normal limits. Chest x-ray showed no active disease.  An EKG showed atrial fibrillation.  There were no pathologic specimens sent to Pathology during the operative procedure.  The patient was recommended for discharge on the morning of May 17, 2010.  He is to stay on his cardiac diet and he is to perform any quiet indoor activity during the next week.  He is to avoid water exposure left ear for the next 2 weeks.  DISCHARGE MEDICATIONS: 1. Cefzil 500 mg p.o. b.i.d. x10 days with food. 2. Percocet 5/325 one to two p.o. q.4 h. p.r.n. pain #30 tabs. 3. Valium 5 mg #30 tabs 1-2 p.o. t.i.d. p.r.n. vertigo.  These were in addition to his normal home medications, which include the following: 1. Coumadin 7.5 mg p.o. daily. 2. Cardizem CD 300 mg XR 24 H capsules one p.o. daily. 3. Lisinopril 40 mg p.o. daily. 4. Lovastatin 20 mg p.o. at bedtime. 5. Nitroglycerin 0.4 mg sublingual 1 under his tongue q.5 minutes     p.r.n. chest pain. 6. Aspirin 81 mg daily.  It should be mentioned, the patient was treated with subcu heparin postoperatively prior to starting his Coumadin.  The patient was instructed to call [phone] for any postoperative problems directly related to his procedure.  He is to contact his cardiologist for any cardiac related issues.  During hospitalization, the patient was in the short-stay unit, the PACU, [facility] Operating Room #3, the PACU, and 3300 step-down unit room 3304.  DISCHARGE STATUS:  Stable.     Carolan Shiver, M.D.     EMK/MEDQ  D:  05/17/2010  T:  05/17/2010  Job:   [id number]  cc:   Marca Ancona, MD  Electronically Signed by Ermalinda Barrios M.D. on 05/23/2010 08:53:57 AM

## 2010-05-23 NOTE — H&P (Signed)
NAMEMEDARDO, HASSING NO.:  [id number]  MEDICAL RECORD NO.:  [id number]           PATIENT TYPE:  I  LOCATION:  3304                         FACILITY:  [facility]  PHYSICIAN:  Carolan Shiver, M.D.    DATE OF BIRTH:  May 24, 1945  DATE OF ADMISSION:  05/16/2010 DATE OF DISCHARGE:  05/17/2010                             HISTORY & PHYSICAL   CHIEF COMPLAINT:  Profound bilateral postlingual sensorineural hearing loss.  HISTORY OF PRESENT ILLNESS:  [name] is a very pleasant 65 year old African American male who is being admitted to [facility] for a cochlear implant left ear to treat profound bilateral postlingual sensorineural deafness.  The patient began losing hearing at around age 73 from unknown etiology.  He had been exposed to [employer] noise during the [place] war and worked at the [employer] facility around loud noise.  Over the years, his hearing has deteriorated beyond that can be helped by hearing aids.  He sought out cochlear implant in order to be able to hear better.  When first seen in my office on March 14, 2010, the patient had moderately severe low-frequency and severe high-frequency sensorineural hearing loss with SRTs of 70 dB right ear at 20% discrimination and SRT left ear of 85 dB with 0% discrimination.  He had intact acoustic reflexes in the right ear both ipsilaterally and contralaterally and one reflex in his left ear at 500 Hz contralaterally.  Aided thresholds showed no marked improvement in thresholds at maximum volume, appropriate for the patient.  He had 29% discrimination left ear and 25% discrimination right ear on CNC to list testing.  On CNC list one testing, the patient had 23% correct unaided and 33% correct aided.  The patient had 9% correct on HINT testing in the aided condition both ears. He had 0% left ear aided and 5% right ear aided.  A CT scan of his temporal bones was obtained and the  patient was found to have patent cochleas.  He had already received a Pneumovax vaccine. Electronystagmography ice water caloric testing documented symmetric caloric function with 40 degrees maximum slow-wave velocities in the right ear and 59 degrees per second slow-wave velocity in the left ear.  The patient and his wife were counseled extensively concerning the advantages, disadvantages, and risks and benefits of cochlear implantation.  He was told that we could not predict whether he would receive any sound stimulation from a cochlear implant.  He also understood that one-third of patients were low performer and one-third were medium performers and one-third were high performers.  He was told that I could not predict which category he would fall into after cochlear implantation.  He and his wife were also counseled that the available cochlear implant at this time was a Nucleus Freedom CI24RE device with contour advanced electrode.  The nucleus 5 implant is not available at this time, although he will receive a nucleus 5 external sound processor.  Risks, complications, and alternatives of cochlear implantation were explained to him.  He read our special cochlear implant informed consent.  Questions were invited and answered.  Informed consent was signed and witnessed.  The patient has a known history of MI and atrial fibrillation and was seen preoperatively by [facility] who cleared him for the general anesthetic.  He was on Coumadin.  The Coumadin was discontinued and managed by the coagulation team at [facility] and was ready for the operation.  The anticoagulation will be started again postoperatively.  The patient had already received a Pneumovax vaccine.  Past medical history includes atrial fibrillation, hyperlipidemia, anterior myocardial infarction presumed secondary to an embolus from atrial fibrillation, hypertension, arthritis, ulcers, colonic polyps, the  profound bilateral sensorineural hearing loss.  OPERATIONS:  No significant operations reported.  MEDICATIONS: 1. Aspirin 81 mg. 2. Warfarin 5 mg tablets as directed, although this has been held for     the past week. 3. Nitroglycerin 0.4 mg sublingual one under his tongue every 5     minutes as needed p.r.n. chest pain. 4. Cardizem CD 300 mg XR 24 h. capsules one p.o. daily. 5. Lisinopril 40 mg daily. 6. Lovastatin 20 mg daily.  ALLERGIES TO MEDICATIONS:  None reported.  SOCIAL HISTORY:  He is married, retired, a former smoker.  No history of HIV positivity.  REVIEW OF SYSTEMS:  Positive for hypertension, hyperlipidemia, history of atrial fibrillation, Peyronie disease, also positive for arthritis, ulcer disease, and colonic polyps.  PHYSICAL EXAMINATION:  VITAL SIGNS:  Stable. GENERAL:  He was awake, alert, coherent, spontaneous, and logical. HEENT:  Head was normocephalic with bilateral symmetric facial motion. External ears and canals were normal.  TMs were clear mobile bilaterally.  External and internal nasal examination was unremarkable. Oral cavity, lips, tongue, palate were normal. CHEST:  Clear. HEART:  Atrial fibrillation. ABDOMEN:  Benign. GENITALIA AND RECTAL:  Not done. NEUROLOGIC:  Physiologic with the exception of the profound bilateral sensorineural hearing loss.  ADMISSION LABORATORY DATA:  Hemoglobin 15.6, hematocrit 44.4, white blood cell count 11,400, platelet count 201,000.  PT was 16.6, PTT 26, INR 1.32.  Sodium was 139, potassium 3.6, BUN was 30, and creatinine 1.11.  Nasal swab was negative for MRSA.  Chest x-ray showed no active disease.  EKG showed atrial fibrillation  Preop audiometric testing on May 15, 2010, documented an SRT of 65 dB right ear with 12% discrimination.  He had an SRT of 85 dB left ear with 8% discrimination.  IMPRESSION: 1. Profound bilateral postlingual sensorineural hearing loss, unable     to wear hearing aids. 2.  History of atrial fibrillation and myocardial infarction. 3. Hyperlipidemia. 4. Hypertension. 5. Arthritis. 6. Ulcer disease. 7. History of colonic polyps.  Recommend the patient's Coumadin and anticoagulation has been managed by the [facility] coag team.  The patient is recommended for a Nucleus Freedom CI24RE cochlear implant with a contour advanced electrode left ear, 3-4 hours, general endotracheal anesthesia, [facility] OR, as with a 23-hour recovery care stay in a monitored bed. Risks, complications, and alternatives to cochlear implantation were explained to the patient and to his wife.  Questions were invited and answered and informed consent was read and signed by the patient.  The procedure was scheduled for the morning of May 16, 2010, [facility] OR room #3, Dr. Claybon Jabs, general endotracheal anesthesia.  The patient will be placed in the PACU, then in an ICU step-down or at least a cardiac monitoring bed postoperatively for 23 hours of observation. His anticoagulation will be gone as soon as he is stable.     Carolan Shiver, M.D.  EMK/MEDQ  D:  05/16/2010  T:  05/17/2010  Job:  [id number]  cc:   Marca Ancona, MD  Electronically Signed by Ermalinda Barrios M.D. on 05/23/2010 08:53:41 AM

## 2010-05-23 NOTE — Op Note (Signed)
NAMEILLIAS, PANTANO NO.:  [id number]  MEDICAL RECORD NO.:  [id number]           PATIENT TYPE:  I  LOCATION:  3304                         FACILITY:  [facility]  PHYSICIAN:  Carolan Shiver, M.D.    DATE OF BIRTH:  10/18/1945  DATE OF PROCEDURE:  05/16/2010 DATE OF DISCHARGE:  05/17/2010                              OPERATIVE REPORT   JUSTIFICATION FOR PROCEDURE:  [name] is a 65 year old African American male, here today for a cochlear implant of his left ear to treat profound bilateral postlingual sensorineural deafness.  The patient was first seen by me as a new patient on March 14, 2010.  At that time, he reported progressive hearing loss beginning at age 54 of unknown etiology, had been exposed to some military noise during the [place] War.  He works at the [employer] and was around loud noise.  He had not worn hearing aids.  He tried hearing aids, but was unable to discriminate any sounds with them at that time. He was found to have normal ear examination.  He had some centralization of vowels in his speech.  He had moderately severe low-frequency sensorineural hearing losses with severe high-frequency hearing loss and SRTs of 70 dB right ear with 20% discrimination, and SRT left ear of 85 dB with 0% discrimination.  Aided thresholds showed no marked improvement is thresholds at maximum volume, appropriate for the patient.  He had 29% discrimination left ear and 25% discrimination of his right ear on CNC 2-list testing.  On CNC list 1, the patient had 23% correct unaided, and 33% correct aided.  He had 9% correct on HINT testing in the aided condition and he had 0% correct in his left ear aided and 5% with his right ear aided.  The patient was counseled concerning the possibility of a cochlear implant.  A CT scan of his temporal bones was performed and he was found to have patent cochleas bilaterally.  He had received  Pneumovax vaccine. Preop ENG ice water caloric testing showed symmetric caloric responses with 40 degrees maximum slow wave velocity right ear, and 59 degrees maximum slow wave velocity left ear.  The patient and his wife were extensively counseled concerning the risks, benefits, advantages, and disadvantages of cochlear implantation.  He was told that there were no guarantees that he would be able to hear with the cochlear implant.  He was also told that one-third of patients were low performers, one-third were middle performers, and one-third were high performers, and we could not determine preoperatively which group he would fall into.  He elected to proceed with a cochlear implant left ear.  He was also informed that the Nucleus freedom implant was available and that the Nucleus 5 implant was not available at this time.  Risks, complications, and alternatives of cochlear implantation were read by the patient and our special cochlear implant informed consent form.  Questions were invited and answered, and informed consent was signed and witnessed.  Preoperatively, the patient was very weighted by [facility] and the patient was noted to have had an  MI and had been in atrial fibrillation.  He was taken off Coumadin by the [facility] and then switched to Lovenox and was taken off aspirin.  This was in preparation for the procedure.  These medications will be resumed postoperatively when he is stable.  He was cleared for general anesthesia by his cardiologists.  JUSTIFICATION FOR INPATIENT SETTING:  Patient's age and need for general endotracheal anesthesia.  JUSTIFICATION FOR OVERNIGHT STAY:  Twenty-three hours of observation of facial function status post cochlear implantation left ear.  PREOPERATIVE DIAGNOSIS:  Profound postlingual sensorineural hearing loss left ear.  POSTOPERATIVE DIAGNOSIS:  Profound postlingual sensorineural hearing loss left  ear.  OPERATION:  Nucleus Freedom with Contour Advanced electrode cochlear implant left ear.  SURGEON:  Carolan Shiver, MD  ANESTHESIA:  General endotracheal, Quita Skye. Krista Blue, MD  COMPLICATIONS:  None.  SUMMARY OF REPORT:  After the patient was taken to the operating room, he was placed in the supine position.  An IV had been begun in the holding area.  General IV induction was then performed under the guidance of Dr. Adonis Huguenin.  The patient was orally intubated.  Eyelids were taped shut.  He was properly positioned and monitored.  Elbows, ankles padded with foam rubber, PAS stockings were applied, Foley catheter was inserted, and I initiated a timeout.  The patient was bald, but small amount of hair was clipped in the left postauricular area, 1000 OR drape was then placed around his ear and a stockinette cap was applied.  His left ear and hemiface were then cleansed with chlorhexidine.  A left postauricular incision was then marked.  Using a Nucleus Freedom BTE template and a dummy template, the position for the ICS was marked on his scalp.  A dot was placed for ICS electronics.  The rest of the incision was then drawn and infiltrated with 7 mL of 1% Xylocaine with 1:100,000 epinephrine.  His left ear and hemiface was then prepped with Betadine and draped in standard fashion for cochlear implant procedure.  Cochlear implant incision was then made with a 15 blade.  The dissection was carried down to the muscular periosteal layer.  Anterior and posterior flaps were elevated.  A muscular periosteal flap based on the ear canal was then elevated with Bovie.  It should mention that I had tattooed through the skin to mark the site through the ICS.  The tattoo mark was identified.  The muscular periosteal flap was then retracted anteriorly.  Self-retaining retractors were placed.  The mastoid cortex was exposed.  Using [employer] Saber drill and suction irrigation, the mastoid cavity  was open without difficulty.  It was found to be normally pneumatized. Antrum was identified as was the sigmoid sinus.  The sigmoid sinus was followed to the digastric periosteum.  The posterior canal wall was thinned.  The antrum was opened and the incus was identified in the fossa incudis.  The dome of the lateral semicircular canal was easily identified.  The facial recess was then opened with cutting and diamond burs.  The chorda tympani nerve was coursing directly through the center of the facial recess and was therefore sacrificed.  The facial recess was opened to expose the middle ear.  The incus and stapes were in normal anatomic position.  The round window niche was identified and the lip was removed with a diamond bur.  The niche was quite constricted, but still patent.  Cochleostomy was then made inferiorly and slightly anterior to the round window  membrane.  The cochlea was entered without difficulty and I could see around the first turn.  Cottonoid soaked in saline was then placed.  A well bony well was then drilled for the ICS with the aid of a template.  Bleeding was controlled with bone wax.  A trough was then drilled from the bony well to the posterior superior edge of the mastoid cavity.  Mastoid cavity was undercut and all bony edges were smoothed. A Freer elevator was then used to create a pocket posterior to the bony well for the ICS and a dummy ICS was inserted to make sure that a good fit was quite be obtained.  Two holes were then drilled on either side of the bony well for suture tie down of the ICS.  Once holes were created with #2 and #1 cutting burs, 5-0 Nurolon suture was threaded through the holes.  The site was then copiously irrigated with bacitracin-containing saline.  All bleeding was controlled with unipolar cautery.  Gowns and gloves were changed.  A Nucleus CI24RE cochlear implant with a Contour Advanced electrode was then opened under saline.   The implant was then inspected under the microscope to make certain that it was intact.  It was found to be satisfactory.  The implant was then taken out of its packaging and the ICS was then placed in a subcutaneous pocket with the ICS electronics in the bony well.  It was then sutured down with a 5-0 Nurolon.  The sheath was then removed from the active electrode array.  The electrode array was then secured proximal to the ribs with a Contour Advanced forceps.  The electrode was situated such that the active electrodes were facing to 2 o'clock to the right-hand side.  The cochleostomy was then suctioned.  The electrode array was placed within the cochleostomy to the level of the white dot along the shaft of the electrode array.  Jeweler's forceps was then used to secure the stylet and the electrode array was then advanced off the stylet into the cochlea in one smooth motion.  There was no resistance.  Cochleostomy was then packed with small rectangles of fascia that had been previously harvested.  This was done 360 degrees around the circumference of the electrode array.  The facial recess was then packed with fascia and muscle rectangles.  Two disks of Gelfoam were then packed into the facial recess for extra stability.  The electrode array was coiled into the mastoid cavity inferiorly and held in place with 2 large rectangles of Gelfoam soaked in saline.  A tunnel was made beneath the temporalis muscle and the ground electrode was then placed beneath the muscle.  The anteriorly based muscular periosteal flap was then used to cover the electrode array and the ground electrode.  This was sutured into place using interrupted 3-0 Vicryls.  The site was then copiously irrigated with bacitracin-containing saline.  Bleeding was controlled with bipolar cautery.  The incision was then closed in 2 layers using interrupted inverted 3-0 Vicryls for subcutaneous layer and skin was closed  with skin staples.  Bacitracin ointment was applied.  The ear was padded with Telfa and cotton and standard adult Glasscock mastoid dressing was applied loosely in standard fashion.  The patient's Foley catheter was removed.  He was awakened, extubated, and then transferred to his hospital bed.  He appeared to tolerate both the general endotracheal anesthesia and the procedures well, left the operating room in stable condition.  Total fluids 1100  mL, total blood loss less than 20 mL, total urine output 600 mL.  Sponge, needle, and cotton ball counts were correct at termination of the procedure.  No specimens were sent to Pathology.  The patient received Ancef 1 gram, IV Zofran 4 mg IV at the beginning of the procedure, Decadron 10 mg IV, and Ofirmev 1 gram IV.  Mr. Barradas will be admitted to the PACU, then to ICU step-down unit or second choice a monitored cardiac bed for overnight observation.  If stable overnight, he will be discharged on May 17, 2010, with his wife who will be instructed to return him to my office on May 24, 2010, at 1:50 p.m.  DISCHARGE MEDICATIONS: 1. Cefzil 500 mg p.o. b.i.d. x10 days, #20. 2. Percocet 5/325 one-two p.o. q.4 h. p.r.n. pain #30. 3. Bacitracin ointment to be applied to his left cochlear implant     incision b.i.d. x7 days.  He is to follow his cardiac diet, keep his head elevated, avoid heavy lifting or straining greater than 20 pounds, and avoid water exposure left ear.  He is to call [phone] for any postoperative problems directly related to the procedure.  He will be given both verbal and written instructions.  SUMMARY:  The patient underwent a straight forward implantation of a Nucleus Freedom with Contour Advanced electrode cochlear implant left ear.  The patient had a well-pneumatized mastoid cavity with normal middle ear anatomy.  Facial recess was open without difficulty.  The facial nerve was not exposed.  Full-length  insertion of the electrode array was completed and documented on intraoperative x-ray.  Chorda tympani nerve was sacrificed as it was coursing directly through the facial recess. There were no complications.     Carolan Shiver, M.D.     EMK/MEDQ  D:  05/16/2010  T:  05/16/2010  Job:  [id number]  cc:   Selena Batten  Electronically Signed by Ermalinda Barrios M.D. on 05/23/2010 08:53:17 AM

## 2010-05-29 NOTE — Medication Information (Signed)
Summary: rov/tm  Anticoagulant Therapy  Managed by: Samantha Crimes, PharmD Referring MD: Olga Millers MD PCP: Toni Amend, MD Supervising MD: Jens Som MD, Arlys John Indication 1: Atrial Fibrillation (ICD-427.31) Lab Used: [facility]  Site: [facility] INR POC 2.4 INR RANGE 2 - 3  Dietary changes: no    Health status changes: no    Bleeding/hemorrhagic complications: no    Recent/future hospitalizations: yes       Details: ear implant last thursday  Any changes in medication regimen? yes       Details: diazepam, cefprozil, oxy/apap  Recent/future dental: no  Any missed doses?: no       Is patient compliant with meds? yes       Current Medications (verified): 1)  Aspirin 81 Mg  Tabs (Aspirin) .... Take 2 Tablet By Mouth Once A Day 2)  Warfarin Sodium 5 Mg Tabs (Warfarin Sodium) .... As Diretced 3)  Nitroglycerin 0.4 Mg Subl (Nitroglycerin) .... One Tablet Under Tongue Every 5 Minutes As Needed For Chest Pain---May Repeat Times Three 4)  Cardizem Cd 300 Mg Xr24h-Cap (Diltiazem Hcl Coated Beads) .... One Tablet By Mouth Once Daily 5)  Lisinopril 40 Mg Tabs (Lisinopril) .Marland Kitchen.. 1 Tab By Mouth Once Daily 6)  Lovastatin 20 Mg Tabs (Lovastatin) .Marland Kitchen.. 1 Tab By Mouth At Bedtime 7)  Multivitamins  Tabs (Multiple Vitamin) .... Take 1 Tablet By Mouth Once A Day 8)  Vitamin E 400 Unit Caps (Vitamin E) .... Take 1 Capsule By Mouth Once A Day 9)  Super Garlic 1000 Mg Caps (Garlic) .... Take 2 Tablets By Mouth Daily 10)  Alfalfa 500 Mg Tabs (Alfalfa) .... Take 2 Tablet By Mouth Daily 11)  Magnesium 300 Mg Caps (Magnesium) .... Take 1 Capsule By Mouth Once A Day 12)  Calcium Carbonate-Vitamin D 600-200 Mg-Unit Caps (Calcium Carbonate-Vitamin D) .... Take 1 Tablet By Mouth Two Times A Day 13)  Vitamin C 500 Mg Tabs (Ascorbic Acid) .... Take 1 Tablet By Mouth Once A Day 14)  Lovenox 120 Mg/0.24ml Soln (Enoxaparin Sodium) .... Take 120mg s Subcutaneously Every Morning At 7am.  Allergies  (verified): No Known Drug Allergies  Anticoagulation Management History:      Positive risk factors for bleeding include an age of 30 years or older.  The bleeding index is 'intermediate risk'.  Positive CHADS2 values include History of HTN.  Negative CHADS2 values include Age > 57 years old.  The start date was 10/30/2007.  His last INR was 2.0 RATIO.  Anticoagulation responsible provider: Jens Som MD, Arlys John.  INR POC: 2.4.  Exp: 04/2011.    Anticoagulation Management Assessment/Plan:      The patient's current anticoagulation dose is Warfarin sodium 5 mg tabs: as diretced.  The target INR is 2 - 3.  The next INR is due 05/21/2010.  Anticoagulation instructions were given to patient.  Results were reviewed/authorized by Samantha Crimes, PharmD.         Prior Anticoagulation Instructions: INR 2.1  Take last coumadin dose on 05/10/10. Start Lovenox 120 mg injection on 05/12/10 once daily in the morning around 7am until 05/15/10 which is your last injection. Restart coumdain and Lovenox injection per surgeon's instructions. When surgeon says its ok to take Coumadin, take 1 and 1/2 tablets for 3 days and then resume normal schedule of 1 and 1/2 tablets everyday except 1 tablet on Mondays, Thursdays, and Saturdays. Restat Lovenox per surgeons instructions once daily in the AM until come back to clinic.  Reheck INR on March 12.  Current Anticoagulation Instructions: Cont with current regimen Return to clinic on 6 weeks

## 2010-06-22 ENCOUNTER — Other Ambulatory Visit: Payer: Self-pay | Admitting: Family Medicine

## 2010-06-22 ENCOUNTER — Other Ambulatory Visit: Payer: Self-pay | Admitting: Cardiology

## 2010-07-02 ENCOUNTER — Encounter: Payer: Self-pay | Admitting: Cardiology

## 2010-07-03 ENCOUNTER — Encounter: Payer: Self-pay | Admitting: Cardiology

## 2010-07-03 ENCOUNTER — Ambulatory Visit (INDEPENDENT_AMBULATORY_CARE_PROVIDER_SITE_OTHER): Payer: Medicare Other | Admitting: *Deleted

## 2010-07-03 ENCOUNTER — Ambulatory Visit (INDEPENDENT_AMBULATORY_CARE_PROVIDER_SITE_OTHER): Payer: Medicare Other | Admitting: Cardiology

## 2010-07-03 VITALS — BP 128/82 | HR 96 | Resp 14 | Ht 64.0 in | Wt 171.0 lb

## 2010-07-03 DIAGNOSIS — I4891 Unspecified atrial fibrillation: Secondary | ICD-10-CM

## 2010-07-03 DIAGNOSIS — I679 Cerebrovascular disease, unspecified: Secondary | ICD-10-CM

## 2010-07-03 DIAGNOSIS — I219 Acute myocardial infarction, unspecified: Secondary | ICD-10-CM

## 2010-07-03 DIAGNOSIS — I10 Essential (primary) hypertension: Secondary | ICD-10-CM

## 2010-07-03 DIAGNOSIS — E785 Hyperlipidemia, unspecified: Secondary | ICD-10-CM

## 2010-07-03 DIAGNOSIS — I2510 Atherosclerotic heart disease of native coronary artery without angina pectoris: Secondary | ICD-10-CM

## 2010-07-03 LAB — POCT INR: INR: 2

## 2010-07-03 NOTE — Assessment & Plan Note (Signed)
Blood pressure controlled. Continue present medications. Potassium and renal function monitored by primary care. 

## 2010-07-03 NOTE — Assessment & Plan Note (Signed)
Followup carotid Dopplers March 2013.

## 2010-07-03 NOTE — Assessment & Plan Note (Signed)
Previous MI felt embolic secondary to atrial fibrillation. Continue Coumadin.

## 2010-07-03 NOTE — Progress Notes (Signed)
HPI:Mr. Gubser is a gentleman who has a history of an acute anterior myocardial infarction occurring in the setting of atrial fibrillation, felt secondary to an embolic event.  His LV function is preserved.  We have been treating with rate control and anticoagulation at his request. His previous cardiac catheterization in August of 2009 revealed an occluded LAD but no other obstructive disease. The LAD occlusion was felt secondary to an embolus and resolved with thrombus aspiration. Echocardiogram in August of 2009 showed hypokinesis of the distal inferior septum and apical inferior wall but normal LV function. Carotid Dopplers in March of 2011 showed 0-39% bilateral stenosis and followup was recommended in 2 years.  I last saw him in Oct of 2011. Since then the patient denies any dyspnea on exertion, orthopnea, PND, pedal edema, palpitations, syncope or exertional chest pain. Rare stingning sensation in chest but not exertional.   Current Outpatient Prescriptions  Medication Sig Dispense Refill  . Ascorbic Acid (VITAMIN C) 500 MG tablet Take 500 mg by mouth daily.        Marland Kitchen aspirin 81 MG tablet Take 2 tablets by mouth daily       . Calcium Carbonate-Vitamin D (CALCIUM-VITAMIN D) 600-200 MG-UNIT CAPS Take 1 tablet by mouth 2 (two) times daily.        Marland Kitchen diltiazem (CARDIZEM CD) 300 MG 24 hr capsule TAKE ONE CAPSULE BY MOUTH EVERY DAY  30 capsule  6  . Garlic Oil (SUPER GARLIC) 1000 MG CAPS Take 2 capsules by mouth daily.        Marland Kitchen lisinopril (PRINIVIL,ZESTRIL) 40 MG tablet take 1 tablet by mouth once daily  30 tablet  11  . lovastatin (MEVACOR) 20 MG tablet Take 1 tablet (20 mg total) by mouth daily.  30 tablet  11  . Magnesium 300 MG CAPS Take 1 capsule by mouth daily.        . Multiple Vitamin (MULTIVITAMIN) tablet Take 1 tablet by mouth daily.        . nitroGLYCERIN (NITROSTAT) 0.4 MG SL tablet Place 0.4 mg under the tongue every 5 (five) minutes as needed. For chest pain--may repeat times three         . vitamin E 400 UNIT capsule Take 400 Units by mouth daily.        Marland Kitchen warfarin (COUMADIN) 5 MG tablet take as directed  45 tablet  11  . DISCONTD: Alfalfa 500 MG TABS Take 2 tablets by mouth daily.        Marland Kitchen DISCONTD: enoxaparin (LOVENOX) 120 MG/0.8ML SOLN Take 120mg s subcutaneously every morning at 7am.          Past Medical History  Diagnosis Date  . Atrial fibrillation   . Hyperlipidemia   . Anterior myocardial infarction     Presumed secondary to embolus from atrial fibrillation  . Hypertension   . Arthritis   . Hard of hearing   . Hemorrhoid   . Hx of adenomatous colonic polyps 07/2008    No past surgical history on file.  History   Social History  . Marital Status: Married    Spouse Name: N/A    Number of Children: N/A  . Years of Education: N/A   Occupational History  . Retired    Social History Main Topics  . Smoking status: Former Games developer  . Smokeless tobacco: Not on file  . Alcohol Use: Yes     occasional  . Drug Use: No  . Sexually Active: Not on file  Other Topics Concern  . Not on file   Social History Narrative  . No narrative on file    ROS: no fevers or chills, productive cough, hemoptysis, dysphasia, odynophagia, melena, hematochezia, dysuria, hematuria, rash, seizure activity, orthopnea, PND, pedal edema, claudication. Remaining systems are negative.  Physical Exam: Well-developed well-nourished in no acute distress.  Skin is warm and dry.  HEENT is s/p recent cochlear implant Neck is supple. No thyromegaly.  Chest is clear to auscultation with normal expansion.  Cardiovascular exam is irregular Abdominal exam nontender or distended. No masses palpated. Extremities show no edema. neuro grossly intact  ECG Atrial fibrillation at a rate of 96. Nonspecific ST changes.

## 2010-07-03 NOTE — Patient Instructions (Signed)
Your physician recommends that you schedule a follow-up appointment in: 1 year with Dr. Crenshaw. 

## 2010-07-03 NOTE — Assessment & Plan Note (Signed)
Continue present medications for rate control. Continue Coumadin with goal INR 2-3. 

## 2010-07-03 NOTE — Assessment & Plan Note (Signed)
Continue statin. Lipids and liver monitored by primary care. 

## 2010-07-24 NOTE — Letter (Signed)
November 23, 2007    [name]. Rodney Som, MD, [facility]  [address]  [address]  [place], [place] [address]   RE:  [name]  MRN:  [id number]  /  DOB:  07-Apr-1945   Dear [name],   It  was my pleasure to see your patient [name] in consultation  regarding further treatment options for atrial fibrillation and atrial  flutter today in the clinic.  As you remember, [name] is a pleasant  65 year old gentleman with recently diagnosed atrial fibrillation and  atrial flutter following a myocardial infarction, which occurred on  October 30, 2007.  The patient estimates that for 2 years, he has had  symptomatic palpitations.  He describes these as very short (2-3  seconds) runs of heart racing without associated symptoms.  These  episodes have increased in frequency and duration over the past 2 years  and typically occur approximately one time per week.  The patient seems  quite sure that these palpitations arise after taking aspartame-  containing products.  He has been diligent to avoid aspartame and feels  that his symptoms have improved.  The patient reports that on October 30, 2007, he had been doing yard work and got too hot.  He subsequently  became diaphoretic with associated fatigue.  He presented to the [facility], where he was found to have an acute anterior myocardial infarction  at that time.  He was also noted to be in atrial fibrillation.  A left  heart catheterization showed mid distal LAD occlusion.  This occlusion  was felt to be due to a thrombus possibly secondary to atrial  fibrillation with embolization.  The occlusion apparently resolved.  The  patient's ejection fraction was documented to be normal, so it was  documented to be 65%.  There was hypokinesis at the distal inferior  septum and apical inferior wall.  The patient was initiated on  anticoagulation with Coumadin and rate controlled with diltiazem.  He  had brief sinus rhythm with heart rate in the 30s.   At discharge, a  [employer] monitor was placed.  This has documented sinus rhythm with  ventricular rates in the 60s, atrial flutter with 2:1 and 4:1  conduction, atrial fibrillation with ventricular rates 70-140 beats per  minute.  Despite multiple documented episodes of atrial fibrillation and  atrial flutter, the patient reports being asymptomatic with these.  He  is unaware of any significant arrhythmia since hospital discharge.  He  denies palpitations, shortness of breath, orthopnea, PND, chest  discomfort, near syncope, syncope, or other concerns.  He is surprised  today to be in atrial fibrillation on clinical exam.   PAST MEDICAL HISTORY:  1. Hypertension.  2. Paroxysmal atrial flutter (as above).  3. Paroxysmal atrial fibrillation (as above).  4. Hyperlipidemia.  5. 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   MEDICATIONS:  1. Aspirin 81 mg daily.  2. Coumadin to maintain an INR between 2 and 3.  3. Diltiazem CD 120 mg daily.  4. Benicar/HCT 40/12.5 mg daily.   ALLERGIES:  The patient denies allergies to shrimps, seafood, shellfish,  IV dye, or medications.   SOCIAL HISTORY:  [place]  He is  a retired Surveyor, quantity for [employer].  He is adopted.  He denies tobacco or alcohol use.   FAMILY HISTORY:  The patient's biological mother died at age 3 of  congestive heart failure.  He is  unaware of any significant medical  history with his father and does not have siblings.   REVIEW OF SYSTEMS:  All systems are reviewed and are negative except as  outlined in the HPI above.   PHYSICAL EXAMINATION:  VITAL SIGNS:  Blood pressure 122/76, heart rate  92, weight 161 pounds, sat is 98%, afebrile.  GENERAL:  The patient is a well-appearing male in no acute distress.  He  is alert and oriented x3.  HEENT:  Normocephalic, atraumatic.  Sclerae clear.  Conjunctivae pink.  Oropharynx clear.  Pupils equal,  round, reactive to light and  accommodation.  The patient has profound hearing loss bilaterally.  NECK:  Supple.  No JVD, lymphadenopathy, or bruits.  LUNGS:  Clear to auscultation bilaterally.  HEART:  Irregularly irregular rhythm.  No murmurs, rubs, or gallop.  GI:  Soft, nontender, nondistended.  Positive bowel sounds.  EXTREMITIES:  No clubbing, cyanosis, or edema.  NEUROLOGIC:  Profound hearing loss as noted above.  Strength and  sensation are intact.  Cranial nerves otherwise unremarkable.  SKIN:  No ecchymosis or laceration.  MUSCULOSKELETAL:  No deformity or atrophy.  PSYCH:  Euthymic mood.  Anxious-appearing affect.   IMPRESSION:  [name] is a pleasant 65 year old gentleman with  recently diagnosed paroxysmal atrial fibrillation and atrial flutter,  who presents today in consultation regarding therapeutic strategies for  both atrial fibrillation and atrial flutter.  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  I think, it is therefore prudent that this patient remain on long-term  anticoagulation with Coumadin.  He is currently reasonably well  controlled with diltiazem.  He has had no symptomatic bradycardic  events, and his heart rate today appears to be in the 80s.  The patient  is very adamant that he is asymptomatic with atrial fibrillation.  He is  also very clear and has intention to take no further medications for his  atrial fibrillation.   PLAN:  Therapeutic strategies for both atrial flutter and atrial  fibrillation were discussed in detail with the patient today.  These  therapies include both medicine and catheter-based therapies.  I have  stressed the importance of anticoagulation with Coumadin long term with  the patient.  We have also addressed the issue of heart rate control.  He will initiate cardiac rehab next week and it will be important to  evaluate heart rate control during exercise.  As long  as the patient is  relatively well rate controlled, I think it would be reasonable to allow  him to continue in atrial fibrillation with rate control and  anticoagulation efforts only.  Should the patient have difficulties with  rate control or develop worsening symptoms of atrial fibrillation or  atrial flutter, rhythm control would certainly be reasonable.  I agree  with you, [name], that hospitalization for initiation of Tikosyn would be  a good idea.  I have also talked to the patient about atrial flutter  ablation, which would be a curative measure as well as rhythm control  with Tikosyn for atrial fibrillation.  The patient presently declines  any efforts for rhythm control with either initiation of an  antiarrhythmic medication or catheter-based therapies.  I believe that  prior to proceeding with atrial fibrillation ablation, we should attempt  rhythm control with antiarrhythmic medications first.  It would be nice  to allow the patient several weeks in sinus rhythm for atrial remodeling  prior to an atrial fibrillation ablation  procedure if possible.  At the  patient's request, I have returned him  to your care.  He has scheduled followup with you in 3 months.  He is  aware that he may contact my office at any time should he wish to  proceed with further treatment strategies for his atrial fibrillation.    Sincerely,      Hillis Range, MD  Electronically Signed    JA/MedQ  DD: 11/23/2007  DT: 11/24/2007  Job #: [id number]   CC:    Ellin Saba., MD

## 2010-07-24 NOTE — Assessment & Plan Note (Signed)
[facility]                            CARDIOLOGY OFFICE NOTE   AKEEL, REFFNER                      MRN:          [id number]  DATE:05/30/2008                            DOB:          24-Dec-1945    Mr. Thrun is a gentleman who has a history of an acute anterior  myocardial infarction occurring in the setting of atrial fibrillation,  felt secondary to an embolic event.  His LV function is preserved.  We  have been treating with a rate control and anticoagulation at his  request.  Since I last saw him, he denies any dyspnea, chest pain,  palpitations, or syncope.  There is no pedal edema.  There is no syncope  or bleeding.   MEDICATIONS AT PRESENT:  1. Coumadin.  2. Cardizem CD 120 mg p.o. daily.  3. Benicar HCT 40/12.5 mg p.o. daily.  4. Aspirin 81 mg p.o. daily.   PHYSICAL EXAMINATION:  VITAL SIGNS:  Blood pressure 156/99 and his pulse  is 101.  HEENT:  Normal.  NECK:  Supple.  CHEST:  Clear.  CARDIOVASCULAR:  Irregular rhythm.  ABDOMEN:  No tenderness.  EXTREMITIES:  No edema.   His electrocardiogram shows atrial fibrillation at a rate of 101.  There  are nonspecific ST changes.   DIAGNOSES:  1. Atrial fibrillation - the patient's rate is mildly increased.  We      did perform a Holter previously that showed it was in the 80-90      range.  I will increase his Cardizem CD to 180 mg p.o. daily.  We      will continue on Coumadin with goal INR 2-3.  He is not interested      in pursuing ablation or antiarrhythmics at this point.  2. Coumadin therapy - this is being monitored at [facility].  He      has requested to decrease the frequency of his visits to 6 weeks.      I have explained the risks of this including elevated INRs or      subtherapeutic INRs both leading to potential bleeding or strokes.      He understands this and still request to be decrease to every 6      weeks.  We will accommodate his wishes.  3. History of  myocardial infarction, felt secondary to an embolic      event - he has refused statins in the past.  4. Hypertension - his blood pressure is elevated.  I am increasing his      Cardizem CD to 180 mg daily.  We will have him track this at home.      We will adjust as indicated.  5. Hyperlipidemia - he has refused statins.  6. Coumadin therapy.   We will see back in 6 months.     Madolyn Frieze Jens Som, MD, [facility]  Electronically Signed    BSC/MedQ  DD: 05/30/2008  DT: 05/31/2008  Job #: [id number]

## 2010-07-24 NOTE — H&P (Signed)
NAMEJOYCE, LECKEY NO.:  [id number]   MEDICAL RECORD NO.:  [id number]          PATIENT TYPE:  OIB   LOCATION:  2906                         FACILITY:  [facility]   PHYSICIAN:  Madolyn Frieze. Jens Som, MD, FACCDATE OF BIRTH:  1945-10-07   DATE OF ADMISSION:  10/30/2007  DATE OF DISCHARGE:                              HISTORY & PHYSICAL   [name] is a 65 year old male with past medical history of  hypertension and hyperlipidemia who presents with an acute anterior  myocardial infarction.  The patient has no prior cardiac history by his  report.  He developed substernal chest pain at approximately 11-12  o'clock today.  The pain was substernal in location and occurred while  he was weed eating.  It did not radiate.  There was associated  diaphoresis, but there was no nausea, vomiting, or shortness of breath.  The pain persisted and he presented to the emergency room.  His  electrocardiogram is consistent with an acute anterior infarct and we  were asked to evaluate.  Note, he also states he has a history of  palpitations and a irregular heartbeat at times.  His medications at  home include Benicar and aspirin.   He has no known drug allergies.   SOCIAL HISTORY:  He does not smoke.  He occasionally consumes an  alcoholic beverage.   His family history is negative for coronary artery disease.   His past medical history is significant for hypertension and  hyperlipidemia.  There is no diabetes mellitus.  He has no other past  medical history by his report.   REVIEW OF SYSTEMS:  He denies any headaches, fevers, or chills.  There  is no productive cough or hemoptysis.  There is no dysphagia,  odynophagia, melena, or hematochezia.  There is no dysuria or hematuria,  but he does complain of frequency.  There is no orthopnea, PND, or pedal  edema.  There is no claudication.  Remaining systems are negative.   His physical examination today shows a blood pressure of  188/108 and his  pulse is in the 80-90 range.  He is well-developed, well-nourished in  mild distress at the time of evaluation.  He is mildly diaphoretic.  His  skin is otherwise unremarkable.  His back is not examined as he is on  the cath lab table at the time of the evaluation.  His HEENT is normal  with normal eyelids.  Note, he is somewhat hard of hearing.  His neck is  supple with normal upstroke bilaterally.  I cannot appreciate bruits.  There is no jugular venous distention.  I cannot appreciate thyromegaly.  His chest is clear to auscultation.  There is no expansion.  Cardiovascular reveals irregular rhythm.  I cannot appreciate murmurs,  rubs, or gallops.  Abdominal exam is nontender and nondistended.  Positive bowel sounds.  No hepatosplenomegaly.  No mass appreciated.  There is no abdominal bruit.  He has 2+ femoral pulses bilaterally.  He  has bilateral femoral bruits.  His extremities show no edema that I can  palpate.  No cords.  He has  2+ dorsalis pedis pulses bilaterally.  His  neurologic exam is grossly intact.   His electrocardiogram shows atrial fibrillation at a rate of 103.  The  occasional PVCs were apparently conducted beats.  He has ST elevation  anteriorly consistent with acute infarct.   DIAGNOSES:  1. Acute anterior myocardial infarction - the patient presents with      acute onset of chest pain approximately 2-3 hours prior to      admission.  His electrocardiogram is consistent with acute anterior      myocardial infarction.  We plan to proceed with emergent cardiac      catheterization and PCI if indicated.  The risk and benefits have      been discussed and the patient agrees to proceed.  We will plan to      treat with aspirin, Plavix, statin, and a beta-blocker.  Note, the      patient is in atrial fibrillation and second possibility other than      the rupture plaque would be an embolic event from his atrial      fibrillation.  2. Atrial  fibrillation - duration of this is unclear.  However, the      patient states that he has had some palpitations and an irregular      heartbeat in the past.  We will treat the patient with beta      blockade for rate control.  If he does not convert while in the      hospital then we will begin Coumadin at discharged and plan to      cardiovert 3 weeks after his INR has been therapeutic.  Note, his      CHADS2 score is 1 for hypertension.  We would therefore not      necessitate Coumadin long term unless it is felt that his infarct      and embolic event from his atrial fibrillation.  He would only      require this 3 weeks prior to cardioversion.  3. Hypertension - his blood pressure is elevated.  We will track this      in the hospital and increase in and add medications as indicated.      4.  Hyperlipidemia - he will need a high-dose statin.  4. Frequency - we will check urinalysis.      Madolyn Frieze Jens Som, MD, [facility]  Electronically Signed     BSC/MEDQ  D:  10/30/2007  T:  10/31/2007  Job:  [id number]

## 2010-07-24 NOTE — Cardiovascular Report (Signed)
NAMECEZAR, MISIASZEK NO.:  [id number]   MEDICAL RECORD NO.:  [id number]          PATIENT TYPE:  OIB   LOCATION:  2906                         FACILITY:  [facility]   PHYSICIAN:  Peter M. Swaziland, M.D.  DATE OF BIRTH:  1945/03/25   DATE OF PROCEDURE:  10/30/2007  DATE OF DISCHARGE:                            CARDIAC CATHETERIZATION   HISTORY OF PRESENT ILLNESS:  The patient is a 65 year old black male  with history of hypertension who presents with acute anterior myocardial  infarction.  The patient was in atrial fibrillation on presentation and  had marked ST elevation in leads V2 through V5.   PROCEDURES:  Left heart catheterization, coronary and left ventricular  angiography, and intracoronary intervention with thrombus extraction.  Access via the right femoral artery using standard Seldinger technique.   EQUIPMENT:  A 6-French 4 cm right and left Judkins catheter, 6-French  pigtail catheter, 6-French arterial sheath, 6 FL-4 guide, Asahi medium  wire and a Fetch thrombus extraction catheter.   MEDICATIONS:  1. Heparin 3800 units IV.  2. Subsequent ACT of 285.  3. Nitroglycerin 200 mcg intracoronary x2.  4. Lopressor 5 mg IV x2.  5. Integrilin double bolus at 180 mcg/kg followed by continuous      infusion.  6. Plavix 600 mg oral load.   HEMODYNAMICS:  Aortic pressure was 187/102 with a mean of 93 mmHg, left  ventricular pressure is 152 with EDP of 16 mmHg.  There was no  significant pullback gradient.   ANGIOGRAPHIC DATA:  The left coronary artery arises and distributes  normally.  The left main coronary artery is normal.   Left anterior descending artery is occluded to the mid to distal vessel.  There is no significant atherosclerotic disease in the LAD.  The first  diagonal branch is very large and bifurcates.  It has mild  irregularities less than 10%-15%.   The left circumflex coronary artery appears normal.   The right coronary has a very large  dominant vessel, has a 40% lesion in  the midvessel.   At this point, we proceeded with intervention of the LAD occlusion.  It  is worth noting that with initial angiogram, the level of occlusion  appeared to progress further distally with subsequent angiogram.  This  was crossed easily with the wire.  The wire positioned in a very far  distal LAD.  A Fetch catheter was used and a single pass was made with  aspiration.  This did result in a removal of a clot.  At this point,  there was restoration of flow to the very far distal LAD at the point of  its terminal bifurcation.  There was no evidence of lesion in the LAD at  the site of occlusion and these findings were felt to be consistent with  an embolic thrombus down the LAD.  Given restoration of the flow, the  patient's chest pain resolved.  His ST elevation improved significantly.  We did perform left ventricular angiography at this point and it showed  normal left ventricular size and systolic function with very focal and  mild hypokinesia of the apex.  Ejection fraction is estimated at 65%.  It was felt that there was no further mechanical intervention that was  needed at this point since there did not appear to be an atherosclerotic  lesion.  The patient was transferred to Intensive Care Unit for further  management.  It was noted at the end of the procedure, the patient did  convert spontaneously to normal sinus rhythm with sinus bradycardia.   FINAL INTERPRETATION:  1. 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      Angiographic features were consistent with an embolic event to the      LAD.  2. Status post successful thrombus aspiration.  3. Good left ventricular systolic function.           ______________________________  Peter M. Swaziland, M.D.     PMJ/MEDQ  D:  10/30/2007  T:  10/31/2007  Job:  [id number]   cc:   Madolyn Frieze. Jens Som, MD, [facility]

## 2010-07-24 NOTE — Assessment & Plan Note (Signed)
[facility]                            CARDIOLOGY OFFICE NOTE   TAEDYN, GLASSCOCK                      MRN:          [id number]  DATE:02/17/2008                            DOB:          05-11-1945    Mr. Mcpartland is a pleasant gentleman who has a history of an acute  anterior myocardial infarction that occurred in the setting of atrial  fibrillation.  Please refer to my previous notes for details.  His  ejection fraction is preserved.  We did feel that his infarct was most  likely related to an embolic event from his atrial fibrillation and he  has been treated with Coumadin.  Also, I note that when he was in the  hospital, he was placed on Cardizem and Lopressor for rate control, but  developed heart rates into the 30s.  He was therefore discharged on  Cardizem CD 120 mg p.o. daily.  We did refer him to Dr. Johney Frame for  evaluation of his atrial fibrillation and management strategies.  At  that time the decision was made to proceed with rate control based on  the patient's wishes.  We had discussed an antiarrhythmic such as  Tikosyn previously, but the patient declined this.  Atrial fibrillation  ablation also was considered, but it was felt that an antiarrhythmic  would be appropriate first if he ever wanted to try to undergo  cardioversion.  Since then the patient denies any dyspnea, palpitations,  or syncope.  There is no pedal edema.  He occasionally feels a brief  pain in his left or right breast for seconds.  It is not exertional and  unlike his previously infarct pain.  Does not radiate.  There is no  associated nausea, vomiting, shortness breath, or diaphoresis.   MEDICATIONS:  1. Coumadin as directed and followed in the Coumadin Clinic.  2. Cardizem CD 120 mg daily.  3. Benicar/HCT 40/12.5 mg p.o. daily.  4. Aspirin 81 mg p.o. daily.   PHYSICAL EXAMINATION:  VITAL SIGNS:  Blood pressure of 140/90, his pulse  was 87.  He weighs 178  pounds.  HEENT:  Normal.  NECK:  Supple.  CHEST:  Clear.  CARDIOVASCULAR:  Irregular rhythm.  ABDOMINAL:  No tenderness.  EXTREMITIES:  No edema.   His electrocardiogram shows atrial fibrillation at a rate of 87.  The  axis is normal.  There are nonspecific ST changes.   DIAGNOSES:  1. Atrial fibrillation - Mr. Bick remains in atrial fibrillation      today.  We again discussed the potential of cardioversion, but he      is not interested in pursuing this.  I have explained that the      longer he is in this rhythm, it will be more difficult to ever get      him out of this in the future.  He understands and moved for rate      control and anticoagulation.  He is not interested in      antiarrhythmic.  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  I am hesitant to increase his Cardizem      due to the history of bradycardia.  I will schedule him to have a      48-hour Holter monitor.  If his heart rate is elevated at times and      also demonstrates bradycardia, then he would benefit from a      pacemaker if he would consider this.  Not optimistic to that would      be the case.  For now we will continue this present dose of      Cardizem and Coumadin.  2. Coumadin therapy - this is being managed in the Coumadin Clinic.  3. History of myocardial infarction, felt secondary to an embolic      event.  We will continue his aspirin, Coumadin, Cardizem, and ARB.      He also had some plaque on his catheterization.  I have recommended      statin, but he has declined this.  I explained the benefits      including reduction and death, myocardial infarction, and stroke.  4. Hypertension - his blood pressure is mildly elevated today.      However, we will track this and increase as needed.  He has been      somewhat hesitant to change medications in the past for initiate      new ones.  5. History of hyperlipidemia.  6. Coumadin therapy.   We will see him  back in 3 months or sooner if necessary.     Madolyn Frieze Jens Som, MD, [facility]  Electronically Signed    BSC/MedQ  DD: 02/17/2008  DT: 02/17/2008  Job #: [id number]

## 2010-07-24 NOTE — Assessment & Plan Note (Signed)
[facility]                            CARDIOLOGY OFFICE NOTE   MAK, BONNY                      MRN:          [id number]  DATE:11/18/2007                            DOB:          01/27/1946    [name] is a 65 year old male that was recently admitted to [facility] with an acute anterior myocardial infarction.  Note, at  the time of his admission, his electrocardiogram also showed atrial  fibrillation.  He underwent cardiac catheterization by Dr. Peter Swaziland.  The left main was normal.  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  The circumflex was normal.  The right coronary artery has a 40%  lesion.  It was noted that at the time of initial angiogram, the level  of the occlusion appeared to progress further distally and he was felt  that it was most likely a clot from his atrial fibrillation.  There was  no evidence of a lesion in LAD at the site of occlusion.  Note, his  ejection fraction was 65%.  The patient did have an echocardiogram as  well on October 31, 2007.  His LV function was normal.  There was  hypokinesis of the distal inferior septum and apical inferior wall.  His  ejection fraction was 60%.  We did place the patient on IV heparin,  until his Coumadin was therapeutic and he was discharged.  Note, that he  was placed on Cardizem during hospitalization for recurrent atrial  fibrillation as well as Lopressor.  However, when he was in sinus  rhythm, he would have heart rates into the 30s.  He was therefore  discharged on Cardizem CD 120 mg p.o. daily.  Since discharge, he has  had a CardioNet monitor in place and he has been found to have  intermittent paroxysmal atrial fibrillation as well as atrial flutter  with a rapid ventricular response.  Note, he has not had palpitations.  He denies any dyspnea, chest pain, presyncope, or syncope.  There is no  pedal edema.   MEDICATIONS:  1. Include Coumadin as  directed.  2. Cardizem CD 120 mg p.o. daily.  3. Benicar 40/12.5 mg p.o. daily.  4. Aspirin 81 mg p.o. daily.   PHYSICAL EXAMINATION:  VITAL SIGNS:  Blood pressure of 145/91.  His  pulse is 87.  He weighs 166 pounds.  HEENT:  Normal.  NECK:  Supple with no bruits.  CHEST:  Clear.  CARDIOVASCULAR:  Regular rate and rhythm.  ABDOMEN:  No tenderness.  EXTREMITIES:  No edema.   DIAGNOSES:  1. Recent myocardial infarction felt to be embolic in etiology,      related to his paroxysmal atrial fibrillation/flutter - The patient      will continue on his aspirin and Coumadin as well as ACE inhibitor.  2. Paroxysmal atrial fibrillation and flutter - [name] and has      clearly demonstrated paroxysmal atrial arrhythmias.  He also has      demonstrated tachy-brady with heart rates into the 30s in the  hospital, while on both Cardizem and Lopressor, but when he goes      into atrial flutter, his heart rates are in the 150-160 range.  We      had a long discussions in the office today concerning the options.      We cannot increase his AV nodal blocking agents further due to the      risk of bradycardia.  I discussed the possibility of beginning an      antiarrhythmic such as Tikosyn, but he did not want to pursue this      option as he would require hospitalization.  I also discussed the      potential for referral to Dr. Johney Frame for consideration of ablation      of both his atrial fibrillation and flutter.  He is agreeable to      discuss this, although he does not like any of the options.  We      also discussed the importance of Coumadin.  I explained the risk of      embolic event including an embolic CVA.  However, he stated he is      not convinced that he will take Coumadin for ever.  He states that      I am not a medication person.  This would favor more aggressive      approach with ablation as well.  If he had no recurrent arrhythmias      6 months after his procedure, we  could potentially stop his      Coumadin.  We will arrange for him to be seen by Dr. Johney Frame in the      near future.  3. Hypertension - His blood pressure is elevated today.  I will      continue his Cardizem and Benicar.  We can track and adjust his      regimen as indicated.  However, he has not convinced that he would      take additional medicines.  4. History of hyperlipidemia.  5. Coumadin therapy - This is being monitored in the [facility]      with a goal INR of 2-3.   I will arrange to see him back in 3 months.  In the meantime, he will be  seen by Dr. Johney Frame.     Madolyn Frieze Jens Som, MD, [facility]  Electronically Signed    BSC/MedQ  DD: 11/18/2007  DT: 11/19/2007  Job #: [id number]

## 2010-07-24 NOTE — Discharge Summary (Signed)
NAMEBUNYAN, BRIER NO.:  [id number]   MEDICAL RECORD NO.:  [id number]          PATIENT TYPE:  INP   LOCATION:  3735                         FACILITY:  [facility]   PHYSICIAN:  Madolyn Frieze. Jens Som, MD, FACCDATE OF BIRTH:  05-19-1945   DATE OF ADMISSION:  10/30/2007  DATE OF DISCHARGE:  11/04/2007                               DISCHARGE SUMMARY   PROCEDURES:  1. Cardiac catheterization.  2. Coronary arteriogram.  3. Left ventriculogram.  4. Intracoronary intervention with thrombus extraction.  5. A 2-D echocardiogram.   PRIMARY/FINAL DISCHARGE DIAGNOSIS:  Acute anterior ST elevation  myocardial infarction.   SECONDARY DIAGNOSES:  1. Hypertension.  2. Hyperlipidemia.  3. Paroxysmal atrial fibrillation and flutter.  4. Anticoagulation with Coumadin, started this admission.  5. Hard-of-hearing.   TIME AT DISCHARGE:  43 minutes.   HOSPITAL COURSE:  Mr. Machia is a 65 year old male with no previous  history of coronary artery disease.  He had acute onset of substernal  chest pain on the day of admission.  He had associated symptoms.  He  came to the emergency room because the pain persisted, and he had an EKG  consistent with an acute anterior infarct.  He was taken directly to the  cath lab.   The cardiac catheterization showed a left anterior descending that was  occluded to the mid and distal vessel with thrombus, which was removed  by thrombectomy.  There was no significant atherosclerotic disease with  only mild irregularities of 10% to 15%.  The circumflex was normal.  The  RCA had a 40% lesion.  His EF was 65%.  He had no further chest pain.   On arrival, he was in atrial fibrillation.  He also had some arrhythmias  that appeared to be atrial flutter.  He was started on a beta-blocker  and IV Cardizem.  He was anticoagulated and transitioned from heparin to  Coumadin.   It was felt that Mr. Waldschmidt MI was felt secondary to an embolic event  from his  atrial fibrillation.  Because there was no critical coronary  artery disease, Plavix was discontinued and his aspirin was decreased.  He had some problems with bleeding in his groin and this was treated  with pressure dressings and an injection of epinephrine and lidocaine.   A 2-D echocardiogram showed a focal wall motion abnormality in the  distal inferior septum and apical inferior wall with an EF of 60% and no  critical valvular abnormalities.  He was seen by Cardiac Rehab and  ambulated with them without chest pain or shortness of breath.  His TSH  was checked and was mildly elevated at 5.258.  Free T3 was 2.9 and free  T4 was 1.35, both within normal limits.  No further inpatient workup is  indicated and he is to follow up with his family physician.   Mr. Acy was converted to sinus rhythm.  He had frequent PACs and it  was felt that he might also be having some short runs of atrial fib, but  he was asymptomatic with these.  He had some bradycardia with  a heart  rate that dropped into the 30s at times, but was nonsustained.  Because  of this, the beta-blocker was discontinued and he remains on Cardizem  alone.  He is tolerating the Cardizem well.  He is to have an outpatient  CardioNet monitor to evaluate him for tachy-brady syndrome.   On November 04, 2007, Mr. Melendrez INR was therapeutic with 24-hour  overlap.  He was ambulating without chest pain or shortness of breath  and his groin, which had been checked by ultrasound showed no evidence  of pseudoaneurysm or AV fistula.  He does have bilateral femoral bruits,  but distal pulses are 2+ and intact.  Dr. Jens Som felt that Mr. Rossa  was stable for discharge with close outpatient followup.  Of note, on  further evaluation, he is considered appropriate to continue his Benicar  and the Cardizem as well as a low-dose diuretic.   DISCHARGE INSTRUCTIONS:  1. His activity level to be increased gradually with no driving for 3       days and no lifting for 2 weeks.  2. He is to call our office for any problems with cath site.  3. He is to get a Coumadin check on November 06, 2007, at 3 p.m. and      follow up with Dr. Jens Som on November 18, 2007, at 11:45.  4. He is to follow up with Dr. Dianna Limbo as needed.  5. He is to get a lipid profile, liver function testing done in 6      weeks.  6. He is to get a CardioNet monitor as well.  7. He is to get a TSH rechecked at Dr. Charmian Muff office.      Theodore Demark, PA-C      Madolyn Frieze. Jens Som, MD, [facility]  Electronically Signed    RB/MEDQ  D:  11/04/2007  T:  11/05/2007  Job:  [id number]   cc:   Ellin Saba., MD

## 2010-07-27 NOTE — Op Note (Signed)
   TNAMEHARJAS, BIGGINS                        ACCOUNT NO.:  [id number]   MEDICAL RECORD NO.:  [id number]                   PATIENT TYPE:  AMB   LOCATION:  ENDO                                 FACILITY:  [facility]   PHYSICIAN:  Georgiana Spinner, M.D.                 DATE OF BIRTH:  1945-08-13   DATE OF PROCEDURE:  11/16/2001  DATE OF DISCHARGE:                                 OPERATIVE REPORT   PROCEDURE:  Colonoscopy.   INDICATIONS:  Colon cancer screening, rectal bleeding, colon polyp.   ANESTHESIA:  Demerol 40, Versed 4 mg.   DESCRIPTION OF PROCEDURE:  With patient mildly sedated in the left lateral  decubitus position, a rectal exam was performed which was unremarkable.  Subsequently, the Olympus videoscopic colonoscope was inserted in the rectum  and passed under direct vision to the cecum, and the prep was good to  excellent.  From this point, the colonoscope was slowly withdrawn, taking  circumferential views of the entire colonic mucosa, stopping only at  approximately 50 cm from the rectum, at which point two small polyps were  seen, photographed, and removed using hot biopsy forceps technique, setting  of 20-20 blended current.  This was done until we reached the rectum which  appeared normal on direct and showed small hemorrhoids on retroflexed view.  The endoscope was straightened and withdrawn.  The patient's vital signs and  pulse oximeter remained stable.  The patient tolerated the procedure well  without apparent complications.   FINDINGS:  1. Polyps at 50 cm.  2. Internal hemorrhoids.  3. Otherwise unremarkable exam.   PLAN:  1. Await biopsy report.  2. The patient will call me for results and follow up with me as an     outpatient.                                               Georgiana Spinner, M.D.    GMO/MEDQ  D:  11/16/2001  T:  11/16/2001  Job:  [id number]   cc:   Lacretia Leigh. Quintella Reichert, M.D.

## 2010-08-02 ENCOUNTER — Ambulatory Visit (INDEPENDENT_AMBULATORY_CARE_PROVIDER_SITE_OTHER): Payer: Medicare Other | Admitting: Family Medicine

## 2010-08-02 ENCOUNTER — Encounter: Payer: Self-pay | Admitting: Family Medicine

## 2010-08-02 VITALS — BP 110/80 | HR 78 | Temp 98.8°F | Ht 65.0 in | Wt 169.0 lb

## 2010-08-02 DIAGNOSIS — R351 Nocturia: Secondary | ICD-10-CM

## 2010-08-02 DIAGNOSIS — E039 Hypothyroidism, unspecified: Secondary | ICD-10-CM

## 2010-08-02 DIAGNOSIS — E559 Vitamin D deficiency, unspecified: Secondary | ICD-10-CM

## 2010-08-02 DIAGNOSIS — N400 Enlarged prostate without lower urinary tract symptoms: Secondary | ICD-10-CM

## 2010-08-02 DIAGNOSIS — N401 Enlarged prostate with lower urinary tract symptoms: Secondary | ICD-10-CM

## 2010-08-02 DIAGNOSIS — I10 Essential (primary) hypertension: Secondary | ICD-10-CM

## 2010-08-02 DIAGNOSIS — H9190 Unspecified hearing loss, unspecified ear: Secondary | ICD-10-CM

## 2010-08-02 DIAGNOSIS — D649 Anemia, unspecified: Secondary | ICD-10-CM

## 2010-08-02 DIAGNOSIS — E785 Hyperlipidemia, unspecified: Secondary | ICD-10-CM

## 2010-08-02 DIAGNOSIS — I4891 Unspecified atrial fibrillation: Secondary | ICD-10-CM

## 2010-08-02 LAB — POCT URINALYSIS DIPSTICK
Blood, UA: NEGATIVE
Glucose, UA: NEGATIVE
Nitrite, UA: NEGATIVE
Protein, UA: NEGATIVE
Urobilinogen, UA: 0.2

## 2010-08-02 LAB — CBC WITH DIFFERENTIAL/PLATELET
Basophils Absolute: 0 10*3/uL (ref 0.0–0.1)
Basophils Relative: 0.5 % (ref 0.0–3.0)
Eosinophils Relative: 1 % (ref 0.0–5.0)
HCT: 43.2 % (ref 39.0–52.0)
Hemoglobin: 14.9 g/dL (ref 13.0–17.0)
Lymphocytes Relative: 25.8 % (ref 12.0–46.0)
Lymphs Abs: 2.2 10*3/uL (ref 0.7–4.0)
Monocytes Relative: 9.8 % (ref 3.0–12.0)
Neutro Abs: 5.4 10*3/uL (ref 1.4–7.7)
RBC: 4.46 Mil/uL (ref 4.22–5.81)
RDW: 12.2 % (ref 11.5–14.6)

## 2010-08-02 MED ORDER — TAMSULOSIN HCL 0.4 MG PO CAPS: 0.4000 mg | ORAL_CAPSULE | Freq: Every day | ORAL | Status: DC

## 2010-08-02 MED ORDER — LOVASTATIN 20 MG PO TABS: 20.0000 mg | ORAL_TABLET | Freq: Every day | ORAL | Status: DC

## 2010-08-02 MED ORDER — LISINOPRIL 40 MG PO TABS: 40.0000 mg | ORAL_TABLET | Freq: Every day | ORAL | Status: DC

## 2010-08-02 NOTE — Patient Instructions (Addendum)
To stop or help  Nocturia will str flomax daily Continue other medications as prescribed Recommend shingles shot will check on cost We'll call lab results Reviewed medications

## 2010-08-03 LAB — BASIC METABOLIC PANEL
BUN: 13 mg/dL (ref 6–23)
Chloride: 107 mEq/L (ref 96–112)
Creatinine, Ser: 1.3 mg/dL (ref 0.4–1.5)
GFR: 70.95 mL/min (ref >60.00)
Glucose, Bld: 109 mg/dL — ABNORMAL HIGH (ref 70–99)
Potassium: 4.2 mEq/L (ref 3.5–5.1)
Sodium: 145 mEq/L (ref 135–145)

## 2010-08-03 LAB — PSA: PSA: 1.46 ng/mL (ref 0.10–4.00)

## 2010-08-03 LAB — HEPATIC FUNCTION PANEL
Albumin: 4.5 g/dL (ref 3.5–5.2)
Total Protein: 7.1 g/dL (ref 6.0–8.3)

## 2010-08-03 LAB — LIPID PANEL
Cholesterol: 161 mg/dL (ref 0–200)
Total CHOL/HDL Ratio: 4

## 2010-08-03 LAB — LDL CHOLESTEROL, DIRECT: Direct LDL: 106.4 mg/dL

## 2010-08-03 LAB — TSH: TSH: 3.12 u[IU]/mL (ref 0.35–5.50)

## 2010-08-06 ENCOUNTER — Encounter: Payer: Self-pay | Admitting: Family Medicine

## 2010-08-06 NOTE — Progress Notes (Signed)
  Subjective:    Patient ID: [name], male    DOB: 06/05/1945, 65 y.o.   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 Jens Som  Care of his atrial fibrillation and MI of which he is functioning very well and is on Coumadin he relates he is doing fine. He also discusses that he watches his diet very well regarding lipids he eats no beef no fried foods nocturia x2 weeks Relates the Peyronies disease and no problems since  No  sexual activity. HPI    Review of Systemssee history of present illness all review of systems no other problems     Objective:   Physical Exam the patient is a well-developed well-nourished black male in no distress alert cooperative and has a hearing device on the left ear HEENT as stated a hearing device in the left ear with an attachment of an instrument behind the ear Carotid pulses are good thyroid level Lungs clear to palpation percussion and auscultation no rales no dullness Heart no evidence of cardiomegaly however atrial fibrillation is present a slow rateno murmurs Abdomen liver spleen and kidneys are nonpalpable normal bowel sounds no masses Genitalia normal Rectal exam reveals a prostate to be enlarged x1 Extremities negative Neurological negative Skin no lesions present       Assessment & Plan:  Hypertension controlled 110/80 no change in therapy Atrial fibrillation treated with Coumadin and diltiazem Deafness improved with surgery Discussed importance of zostavax patient decides to wait, other immunizations up-to-date Hyperlipidemia control Nocturia initiate treatment with Flomax

## 2010-08-08 NOTE — Progress Notes (Signed)
Quick Note:  Pt is aware. ______ 

## 2010-08-09 ENCOUNTER — Other Ambulatory Visit (INDEPENDENT_AMBULATORY_CARE_PROVIDER_SITE_OTHER): Payer: Medicare Other

## 2010-08-09 DIAGNOSIS — D126 Benign neoplasm of colon, unspecified: Secondary | ICD-10-CM

## 2010-08-09 DIAGNOSIS — K635 Polyp of colon: Secondary | ICD-10-CM

## 2010-08-09 LAB — HEMOCCULT GUIAC POC 1CARD (OFFICE): Fecal Occult Blood, POC: NEGATIVE

## 2010-08-14 ENCOUNTER — Ambulatory Visit (INDEPENDENT_AMBULATORY_CARE_PROVIDER_SITE_OTHER): Payer: Medicare Other | Admitting: *Deleted

## 2010-08-14 DIAGNOSIS — I4891 Unspecified atrial fibrillation: Secondary | ICD-10-CM

## 2010-08-14 LAB — POCT INR: INR: 2.8

## 2010-09-25 ENCOUNTER — Ambulatory Visit (INDEPENDENT_AMBULATORY_CARE_PROVIDER_SITE_OTHER): Payer: Medicare Other | Admitting: *Deleted

## 2010-09-25 DIAGNOSIS — I4891 Unspecified atrial fibrillation: Secondary | ICD-10-CM

## 2010-09-25 LAB — POCT INR: INR: 2.3

## 2010-11-06 ENCOUNTER — Ambulatory Visit (INDEPENDENT_AMBULATORY_CARE_PROVIDER_SITE_OTHER): Payer: Medicare Other | Admitting: *Deleted

## 2010-11-06 DIAGNOSIS — I4891 Unspecified atrial fibrillation: Secondary | ICD-10-CM

## 2010-11-06 LAB — POCT INR: INR: 2.3

## 2010-12-18 ENCOUNTER — Ambulatory Visit (INDEPENDENT_AMBULATORY_CARE_PROVIDER_SITE_OTHER): Payer: Medicare Other | Admitting: *Deleted

## 2010-12-18 DIAGNOSIS — I4891 Unspecified atrial fibrillation: Secondary | ICD-10-CM

## 2010-12-18 LAB — POCT INR: INR: 1.8

## 2011-01-23 ENCOUNTER — Other Ambulatory Visit: Payer: Self-pay | Admitting: Cardiology

## 2011-01-29 ENCOUNTER — Ambulatory Visit (INDEPENDENT_AMBULATORY_CARE_PROVIDER_SITE_OTHER): Payer: Medicare Other | Admitting: *Deleted

## 2011-01-29 DIAGNOSIS — I4891 Unspecified atrial fibrillation: Secondary | ICD-10-CM

## 2011-01-29 LAB — POCT INR: INR: 2.2

## 2011-03-13 ENCOUNTER — Ambulatory Visit (INDEPENDENT_AMBULATORY_CARE_PROVIDER_SITE_OTHER): Payer: Medicare Other | Admitting: *Deleted

## 2011-03-13 DIAGNOSIS — I4891 Unspecified atrial fibrillation: Secondary | ICD-10-CM

## 2011-03-13 MED ORDER — ASPIRIN 81 MG PO TABS: 160.0000 mg | ORAL_TABLET | Freq: Every day | ORAL | Status: DC

## 2011-04-23 ENCOUNTER — Ambulatory Visit (INDEPENDENT_AMBULATORY_CARE_PROVIDER_SITE_OTHER): Payer: Medicare Other | Admitting: *Deleted

## 2011-04-23 DIAGNOSIS — I4891 Unspecified atrial fibrillation: Secondary | ICD-10-CM | POA: Diagnosis not present

## 2011-05-07 ENCOUNTER — Ambulatory Visit (INDEPENDENT_AMBULATORY_CARE_PROVIDER_SITE_OTHER): Payer: Medicare Other | Admitting: *Deleted

## 2011-05-07 DIAGNOSIS — I4891 Unspecified atrial fibrillation: Secondary | ICD-10-CM | POA: Diagnosis not present

## 2011-05-07 NOTE — Patient Instructions (Signed)
Patient requests 4 week visit.  Patient understands the risks of anticoagulation.

## 2011-05-27 ENCOUNTER — Encounter: Payer: Self-pay | Admitting: Gastroenterology

## 2011-05-30 ENCOUNTER — Encounter: Payer: Self-pay | Admitting: Gastroenterology

## 2011-06-04 ENCOUNTER — Ambulatory Visit (INDEPENDENT_AMBULATORY_CARE_PROVIDER_SITE_OTHER): Payer: Medicare Other | Admitting: *Deleted

## 2011-06-04 DIAGNOSIS — I4891 Unspecified atrial fibrillation: Secondary | ICD-10-CM

## 2011-06-04 LAB — POCT INR: INR: 1.9

## 2011-06-05 ENCOUNTER — Other Ambulatory Visit (INDEPENDENT_AMBULATORY_CARE_PROVIDER_SITE_OTHER): Payer: Medicare Other

## 2011-06-05 ENCOUNTER — Encounter: Payer: Self-pay | Admitting: Internal Medicine

## 2011-06-05 ENCOUNTER — Ambulatory Visit (INDEPENDENT_AMBULATORY_CARE_PROVIDER_SITE_OTHER): Payer: Medicare Other | Admitting: Internal Medicine

## 2011-06-05 VITALS — BP 132/86 | HR 84 | Temp 98.7°F | Resp 16 | Wt 161.0 lb

## 2011-06-05 DIAGNOSIS — R7303 Prediabetes: Secondary | ICD-10-CM | POA: Insufficient documentation

## 2011-06-05 DIAGNOSIS — R7309 Other abnormal glucose: Secondary | ICD-10-CM

## 2011-06-05 DIAGNOSIS — E039 Hypothyroidism, unspecified: Secondary | ICD-10-CM

## 2011-06-05 DIAGNOSIS — E559 Vitamin D deficiency, unspecified: Secondary | ICD-10-CM

## 2011-06-05 DIAGNOSIS — E7800 Pure hypercholesterolemia, unspecified: Secondary | ICD-10-CM

## 2011-06-05 DIAGNOSIS — I10 Essential (primary) hypertension: Secondary | ICD-10-CM

## 2011-06-05 DIAGNOSIS — I4891 Unspecified atrial fibrillation: Secondary | ICD-10-CM

## 2011-06-05 LAB — CBC WITH DIFFERENTIAL/PLATELET
Basophils Relative: 0.6 % (ref 0.0–3.0)
Eosinophils Absolute: 0.1 10*3/uL (ref 0.0–0.7)
MCHC: 33.8 g/dL (ref 30.0–36.0)
MCV: 96.7 fl (ref 78.0–100.0)
Monocytes Absolute: 0.9 10*3/uL (ref 0.1–1.0)
Neutrophils Relative %: 62.3 % (ref 43.0–77.0)
Platelets: 168 10*3/uL (ref 150.0–400.0)

## 2011-06-05 LAB — COMPREHENSIVE METABOLIC PANEL
Alkaline Phosphatase: 61 U/L (ref 39–117)
BUN: 10 mg/dL (ref 6–23)
Creatinine, Ser: 1.1 mg/dL (ref 0.4–1.5)
Glucose, Bld: 93 mg/dL (ref 70–99)
Sodium: 139 mEq/L (ref 135–145)
Total Bilirubin: 0.4 mg/dL (ref 0.3–1.2)

## 2011-06-05 LAB — URINALYSIS, ROUTINE W REFLEX MICROSCOPIC
Bilirubin Urine: NEGATIVE
Hgb urine dipstick: NEGATIVE
Nitrite: NEGATIVE

## 2011-06-05 LAB — LIPID PANEL
Cholesterol: 166 mg/dL (ref 0–200)
HDL: 48.6 mg/dL (ref >39.00)
LDL Cholesterol: 87 mg/dL (ref 0–99)
Triglycerides: 153 mg/dL — ABNORMAL HIGH (ref 0.0–149.0)
VLDL: 30.6 mg/dL (ref 0.0–40.0)

## 2011-06-05 LAB — TSH: TSH: 3.47 u[IU]/mL (ref 0.35–5.50)

## 2011-06-05 LAB — HM DIABETES FOOT EXAM: HM Diabetic Foot Exam: NORMAL

## 2011-06-05 NOTE — Assessment & Plan Note (Signed)
I will check his a1c today 

## 2011-06-05 NOTE — Assessment & Plan Note (Signed)
For a TSH today 

## 2011-06-05 NOTE — Progress Notes (Signed)
Subjective:    Patient ID: [name], male    DOB: [birth year], 66 y.o.   MRN: [id number]  Hyperlipidemia This is a chronic problem. The current episode started more than 1 year ago. The problem is controlled. Recent lipid tests were reviewed and are variable. Exacerbating diseases include hypothyroidism. He has no history of chronic renal disease, diabetes, liver disease, obesity or nephrotic syndrome. Factors aggravating his hyperlipidemia include no known factors. Associated symptoms include leg pain and myalgias. Pertinent negatives include no chest pain, focal sensory loss, focal weakness or shortness of breath. Current antihyperlipidemic treatment includes statins. The current treatment provides moderate improvement of lipids. There are no compliance problems.   Hypertension This is a chronic problem. The current episode started more than 1 year ago. The problem has been gradually improving since onset. The problem is controlled. Pertinent negatives include no anxiety, blurred vision, chest pain, headaches, malaise/fatigue, neck pain, orthopnea, palpitations, peripheral edema, PND, shortness of breath or sweats. There are no associated agents to hypertension. Past treatments include calcium channel blockers and ACE inhibitors. The current treatment provides moderate improvement. There are no compliance problems.  Hypertensive end-organ damage includes CAD/MI. There is no history of chronic renal disease.      Review of Systems  Constitutional: Negative for fever, chills, malaise/fatigue, diaphoresis, activity change, appetite change, fatigue and unexpected weight change.  HENT: Negative.  Negative for neck pain.   Eyes: Negative.  Negative for blurred vision.  Respiratory: Negative for cough, choking, chest tightness, shortness of breath and stridor.   Cardiovascular: Negative for chest pain, palpitations, orthopnea, leg swelling and PND.  Gastrointestinal: Positive for constipation.  Negative for nausea, abdominal pain, diarrhea, blood in stool, abdominal distention, anal bleeding and rectal pain.  Genitourinary: Negative.   Musculoskeletal: Positive for myalgias. Negative for back pain, joint swelling, arthralgias and gait problem.  Skin: Negative for color change, pallor, rash and wound.  Neurological: Negative for dizziness, tremors, focal weakness, seizures, syncope, facial asymmetry, speech difficulty, weakness, light-headedness, numbness and headaches.  Hematological: Negative for adenopathy. Does not bruise/bleed easily.  Psychiatric/Behavioral: Negative.        Objective:   Physical Exam  Vitals reviewed. Constitutional: He is oriented to person, place, and time. He appears well-developed and well-nourished. No distress.  HENT:  Head: Normocephalic and atraumatic.  Mouth/Throat: Oropharynx is clear and moist. No oropharyngeal exudate.  Eyes: Conjunctivae are normal. Right eye exhibits no discharge. Left eye exhibits no discharge. No scleral icterus.  Neck: Normal range of motion. Neck supple. No JVD present. No tracheal deviation present. No thyromegaly present.  Cardiovascular: Normal rate and intact distal pulses.  An irregularly irregular rhythm present. Exam reveals no gallop.   No murmur heard. Pulmonary/Chest: Effort normal and breath sounds normal. No stridor. No respiratory distress. He has no wheezes. He has no rales. He exhibits no tenderness.  Abdominal: Soft. Bowel sounds are normal. He exhibits no distension. There is no tenderness. There is no rebound and no guarding.  Musculoskeletal: Normal range of motion. He exhibits no edema and no tenderness.  Lymphadenopathy:    He has no cervical adenopathy.  Neurological: He is oriented to person, place, and time.  Skin: Skin is warm and dry. No rash noted. He is not diaphoretic. No erythema. No pallor.  Psychiatric: He has a normal mood and affect. His behavior is normal. Judgment and thought content  normal.      Lab Results  Component Value Date   WBC 8.6 08/02/2010  HGB 14.9 08/02/2010   HCT 43.2 08/02/2010   PLT 180.0 08/02/2010   GLUCOSE 109* 08/02/2010   CHOL 161 08/02/2010   TRIG 204.0* 08/02/2010   HDL 41.10 08/02/2010   LDLDIRECT 106.4 08/02/2010   LDLCALC 91 12/20/2009   ALT 17 08/02/2010   AST 22 08/02/2010   NA 145 08/02/2010   K 4.2 08/02/2010   CL 107 08/02/2010   CREATININE 1.3 08/02/2010   BUN 13 08/02/2010   CO2 26 08/02/2010   TSH 3.12 08/02/2010   PSA 1.46 08/02/2010   INR 1.9 06/04/2011      Assessment & Plan:

## 2011-06-05 NOTE — Assessment & Plan Note (Signed)
The software would not allow me to order a vit d level

## 2011-06-05 NOTE — Patient Instructions (Signed)
Hypertension As your heart beats, it forces blood through your arteries. This force is your blood pressure. If the pressure is too high, it is called hypertension (HTN) or high blood pressure. HTN is dangerous because you may have it and not know it. High blood pressure may mean that your heart has to work harder to pump blood. Your arteries may be narrow or stiff. The extra work puts you at risk for heart disease, stroke, and other problems.  Blood pressure consists of two numbers, a higher number over a lower, 110/72, for example. It is stated as "110 over 72." The ideal is below 120 for the top number (systolic) and under 80 for the bottom (diastolic). Write down your blood pressure today. You should pay close attention to your blood pressure if you have certain conditions such as:  Heart failure.   Prior heart attack.   Diabetes   Chronic kidney disease.   Prior stroke.   Multiple risk factors for heart disease.  To see if you have HTN, your blood pressure should be measured while you are seated with your arm held at the level of the heart. It should be measured at least twice. A one-time elevated blood pressure reading (especially in the Emergency Department) does not mean that you need treatment. There may be conditions in which the blood pressure is different between your right and left arms. It is important to see your caregiver soon for a recheck. Most people have essential hypertension which means that there is not a specific cause. This type of high blood pressure may be lowered by changing lifestyle factors such as:  Stress.   Smoking.   Lack of exercise.   Excessive weight.   Drug/tobacco/alcohol use.   Eating less salt.  Most people do not have symptoms from high blood pressure until it has caused damage to the body. Effective treatment can often prevent, delay or reduce that damage. TREATMENT  When a cause has been identified, treatment for high blood pressure is  directed at the cause. There are a large number of medications to treat HTN. These fall into several categories, and your caregiver will help you select the medicines that are best for you. Medications may have side effects. You should review side effects with your caregiver. If your blood pressure stays high after you have made lifestyle changes or started on medicines,   Your medication(s) may need to be changed.   Other problems may need to be addressed.   Be certain you understand your prescriptions, and know how and when to take your medicine.   Be sure to follow up with your caregiver within the time frame advised (usually within two weeks) to have your blood pressure rechecked and to review your medications.   If you are taking more than one medicine to lower your blood pressure, make sure you know how and at what times they should be taken. Taking two medicines at the same time can result in blood pressure that is too low.  SEEK IMMEDIATE MEDICAL CARE IF:  You develop a severe headache, blurred or changing vision, or confusion.   You have unusual weakness or numbness, or a faint feeling.   You have severe chest or abdominal pain, vomiting, or breathing problems.  MAKE SURE YOU:   Understand these instructions.   Will watch your condition.   Will get help right away if you are not doing well or get worse.  Document Released: 02/25/2005 Document Revised: 02/14/2011 Document Reviewed:   10/16/2007 [employer] Patient Information 2012 [place], [place].Hypercholesterolemia High Blood Cholesterol Cholesterol is a white, waxy, fat-like protein needed by your body in small amounts. The liver makes all the cholesterol you need. It is carried from the liver by the blood through the blood vessels. Deposits (plaque) may build up on blood vessel walls. This makes the arteries narrower and stiffer. Plaque increases the risk for heart attack and stroke. You cannot feel your cholesterol level even if  it is very high. The only way to know is by a blood test to check your lipid (fats) levels. Once you know your cholesterol levels, you should keep a record of the test results. Work with your caregiver to to keep your levels in the desired range. WHAT THE RESULTS MEAN:  Total cholesterol is a rough measure of all the cholesterol in your blood.   LDL is the so-called bad cholesterol. This is the type that deposits cholesterol in the walls of the arteries. You want this level to be low.   HDL is the good cholesterol because it cleans the arteries and carries the LDL away. You want this level to be high.   Triglycerides are fat that the body can either burn for energy or store. High levels are closely linked to heart disease.  DESIRED LEVELS:  Total cholesterol below 200.   LDL below 100 for people at risk, below 70 for very high risk.   HDL above 50 is good, above 60 is best.   Triglycerides below 150.  HOW TO LOWER YOUR CHOLESTEROL:  Diet.   Choose fish or white meat chicken and [place], roasted or baked. Limit fatty cuts of red meat, fried foods, and processed meats, such as sausage and lunch meat.   Eat lots of fresh fruits and vegetables. Choose whole grains, beans, pasta, potatoes and cereals.   Use only small amounts of olive, corn or canola oils. Avoid butter, mayonnaise, shortening or palm kernel oils. Avoid foods with trans-fats.   Use skim/nonfat milk and low-fat/nonfat yogurt and cheeses. Avoid whole milk, cream, ice cream, egg yolks and cheeses. Healthy desserts include angel food cake, gingersnaps, animal crackers, hard candy, popsicles, and low-fat/nonfat frozen yogurt. Avoid pastries, cakes, pies and cookies.   Exercise.   A regular program helps decrease LDL and raises HDL.   Helps with weight control.   Do things that increase your activity level like gardening, walking, or taking the stairs.   Medication.   May be prescribed by your caregiver to help lowering  cholesterol and the risk for heart disease.   You may need medicine even if your levels are normal if you have several risk factors.  HOME CARE INSTRUCTIONS   Follow your diet and exercise programs as suggested by your caregiver.   Take medications as directed.   Have blood work done when your caregiver feels it is necessary.  MAKE SURE YOU:   Understand these instructions.   Will watch your condition.   Will get help right away if you are not doing well or get worse.  Document Released: 02/25/2005 Document Revised: 02/14/2011 Document Reviewed: 08/13/2006 [facility] Patient Information 2012 [place], [place].

## 2011-06-05 NOTE — Assessment & Plan Note (Signed)
He is having muscle aches so I have asked him to stop mevacor for now, today I will check his CPK FLP and CMP

## 2011-06-05 NOTE — Assessment & Plan Note (Signed)
His BP is well controlled, I will check his lytes and renal function today 

## 2011-06-05 NOTE — Assessment & Plan Note (Signed)
He has good rate control today, he will not change coumadin to xarelto b/c it is too expensive for him

## 2011-06-26 ENCOUNTER — Ambulatory Visit: Payer: Medicare Other | Admitting: Gastroenterology

## 2011-06-26 ENCOUNTER — Ambulatory Visit (INDEPENDENT_AMBULATORY_CARE_PROVIDER_SITE_OTHER): Payer: Medicare Other | Admitting: Gastroenterology

## 2011-06-26 ENCOUNTER — Encounter: Payer: Self-pay | Admitting: Gastroenterology

## 2011-06-26 VITALS — BP 128/80 | HR 60 | Ht 65.0 in | Wt 160.8 lb

## 2011-06-26 DIAGNOSIS — Z860100 Personal history of colon polyps, unspecified: Secondary | ICD-10-CM

## 2011-06-26 DIAGNOSIS — Z7901 Long term (current) use of anticoagulants: Secondary | ICD-10-CM

## 2011-06-26 DIAGNOSIS — K5900 Constipation, unspecified: Secondary | ICD-10-CM | POA: Diagnosis not present

## 2011-06-26 MED ORDER — MOVIPREP 100 G PO SOLR: 1.0000 | Freq: Once | ORAL | Status: DC

## 2011-06-26 NOTE — Progress Notes (Signed)
History of Present Illness: This is a 66 year old male here today his wife. He has ongoing constipation which is well controlled at this time with the regular use of stool softeners. He has stable hypertension, hyperlipidemia and atrial fibrillation managed on chronic Coumadin. He has a history of an adenomatous colon polyp in May 2010. Denies weight loss, abdominal pain, diarrhea, change in stool caliber, melena, hematochezia, nausea, vomiting, dysphagia, reflux symptoms, chest pain.  Current Medications, Allergies, Past Medical History, Past Surgical History, Family History and Social History were reviewed in [employer] record.  Physical Exam: General: Well developed , well nourished, no acute distress Head: Normocephalic and atraumatic Eyes:  sclerae anicteric, EOMI Ears: Decreased auditory acuity Mouth: No deformity or lesions Lungs: Clear throughout to auscultation Heart: Regular rate and rhythm; no murmurs, rubs or bruits Abdomen: Soft, non tender and non distended. No masses, hepatosplenomegaly or hernias noted. Normal Bowel sounds Rectal: Deferred to colonoscopy Musculoskeletal: Symmetrical with no gross deformities  Pulses:  Normal pulses noted Extremities: No clubbing, cyanosis, edema or deformities noted Neurological: Alert oriented x 4, grossly nonfocal Psychological:  Alert and cooperative. Normal mood and affect  Assessment and Recommendations:  1. Personal history of adenomatous colon polyps. Index polypectomy in May 2010. The risks, benefits, and alternatives to colonoscopy with possible biopsy and possible polypectomy were discussed with the patient and they consent to proceed.   2. Chronic constipation. Continue high fiber diet, adequate water intake and the regular use of stool softeners .  3. Chronic Coumadin anticoagulation for atrial fibrillation. The risks, benefits and alternatives of 5 dayhold of Coumadin were discussed with the patient he  consents to proceed. Obtain clearance from his prescribing physician.

## 2011-06-26 NOTE — Patient Instructions (Signed)
You have been scheduled for a colonoscopy. Please follow written instructions given to you at your visit today.  Please pick up your prep kit at the pharmacy within the next 1-3 days. You will be contaced by our office prior to your procedure for directions on holding your Coumadin/Warfarin.  If you do not hear from our office 1 week prior to your scheduled procedure, please call [phone] to discuss. cc: Sanda Linger, MD

## 2011-07-01 ENCOUNTER — Telehealth: Payer: Self-pay

## 2011-07-01 NOTE — Telephone Encounter (Signed)
Will need lovenox bridge while off coumadin [name]

## 2011-07-01 NOTE — Telephone Encounter (Signed)
  RE: [name] DOB: Jan 31, 1946 MRN: [id number]   Dear Dr. Jens Som,    We have scheduled the above patient for an endoscopic procedure. Our records show that he is on anticoagulation therapy.   Please advise as to how long the patient may come off his therapy of coumadin prior to the procedure, which is scheduled for 07/10/11.  Please fax back/ or route the completed form to [place] at [phone].   Sincerely,  Christie Nottingham, CMA     Please respond by today and route back to Christie Nottingham, CMA

## 2011-07-02 ENCOUNTER — Other Ambulatory Visit: Payer: Self-pay | Admitting: Family Medicine

## 2011-07-02 ENCOUNTER — Ambulatory Visit (INDEPENDENT_AMBULATORY_CARE_PROVIDER_SITE_OTHER): Payer: Medicare Other | Admitting: Pharmacist

## 2011-07-02 DIAGNOSIS — I4891 Unspecified atrial fibrillation: Secondary | ICD-10-CM | POA: Diagnosis not present

## 2011-07-02 LAB — POCT INR: INR: 2.1

## 2011-07-02 NOTE — Telephone Encounter (Signed)
Will forward to [facility] and anticoag clinic.

## 2011-07-10 ENCOUNTER — Encounter: Payer: Self-pay | Admitting: Gastroenterology

## 2011-07-10 ENCOUNTER — Ambulatory Visit (AMBULATORY_SURGERY_CENTER): Payer: Medicare Other | Admitting: Gastroenterology

## 2011-07-10 VITALS — BP 156/77 | HR 74 | Temp 96.5°F | Resp 20 | Ht 65.0 in | Wt 160.0 lb

## 2011-07-10 DIAGNOSIS — Z86010 Personal history of colonic polyps: Secondary | ICD-10-CM

## 2011-07-10 DIAGNOSIS — D126 Benign neoplasm of colon, unspecified: Secondary | ICD-10-CM

## 2011-07-10 DIAGNOSIS — Z1211 Encounter for screening for malignant neoplasm of colon: Secondary | ICD-10-CM

## 2011-07-10 DIAGNOSIS — Z7901 Long term (current) use of anticoagulants: Secondary | ICD-10-CM

## 2011-07-10 MED ORDER — SODIUM CHLORIDE 0.9 % IV SOLN: 500.0000 mL | INTRAVENOUS | Status: DC

## 2011-07-10 NOTE — Op Note (Signed)
[facility] [address] [place], [place]  [address]  COLONOSCOPY PROCEDURE REPORT  PATIENT:  [name], [name]  MR#:  [id number] BIRTHDATE:  March 07, 1946, 66 yrs. old  GENDER:  male ENDOSCOPIST:  Judie Petit T. Russella Dar, MD, [facility]  PROCEDURE DATE:  07/10/2011 PROCEDURE:  Colonoscopy with snare polypectomy ASA CLASS:  Class III INDICATIONS:  1) surveillance and high-risk screening  2) history of pre-cancerous (adenomatous) colon polyps: 07/2008 MEDICATIONS:   These medications were titrated to patient response per physician's verbal order, Fentanyl 50 mcg IV, Versed 5 mg IV DESCRIPTION OF PROCEDURE:   After the risks benefits and alternatives of the procedure were thoroughly explained, informed consent was obtained.  Digital rectal exam was performed and revealed no abnormalities.   The LB [id number] endoscope was introduced through the anus and advanced to the cecum, which was identified by both the appendix and ileocecal valve, without limitations.  The quality of the prep was good, using MoviPrep. The instrument was then slowly withdrawn as the colon was fully examined. <<PROCEDUREIMAGES>> FINDINGS:  Two polyps were found in the sigmoid colon. They were 5 mm in size. Polyps were snared without cautery. Retrieval was successful for 1 of the 2 polyps.  Otherwise normal colonoscopy without other polyps, masses, vascular ectasias, or inflammatory changes.  Retroflexed views in the rectum revealed no abnormalities.  The time to cecum =  3.25  minutes. The scope was then withdrawn (time =  10.75  min) from the patient and the procedure completed.  COMPLICATIONS:  None  ENDOSCOPIC IMPRESSION: 1) Two 5mm polyps in the sigmoid colon  RECOMMENDATIONS: 1) Await pathology results 2) Hold aspirin, aspirin products, and anti-inflammatory medication for 2 weeks. 3) Resume Coumadin (warfarin) today and have your PT/INR checked within 1 week. 4) Repeat Colonoscopy in 5  years.  Venita Lick. Russella Dar, MD, [facility]  n. eSIGNED:   Venita Lick. Sueanne Maniaci at 07/10/2011 02:33 PM  Meryle Ready, [id number]

## 2011-07-10 NOTE — Patient Instructions (Addendum)
Colonoscopy Findings:  Two Polyps HOLD ASPIRIN, ASPIRIN PRODUCTS, AND ANTI-INFLAMMATORY MEDICATIONS FOR 2 WEEKS. RESUME COUMADIN TODAY AND HAVE YOUR PT/INR CHECKED WITHIN ONE WEEK. Repeat Colonoscopy in 5 Years.  YOU HAD AN ENDOSCOPIC PROCEDURE TODAY AT THE [facility]: Refer to the procedure report that was given to you for any specific questions about what was found during the examination.  If the procedure report does not answer your questions, please call your gastroenterologist to clarify.  If you requested that your care partner not be given the details of your procedure findings, then the procedure report has been included in a sealed envelope for you to review at your convenience later.  YOU SHOULD EXPECT: Some feelings of bloating in the abdomen. Passage of more gas than usual.  Walking can help get rid of the air that was put into your GI tract during the procedure and reduce the bloating. If you had a lower endoscopy (such as a colonoscopy or flexible sigmoidoscopy) you may notice spotting of blood in your stool or on the toilet paper. If you underwent a bowel prep for your procedure, then you may not have a normal bowel movement for a few days.  DIET: Your first meal following the procedure should be a light meal and then it is ok to progress to your normal diet.  A half-sandwich or bowl of soup is an example of a good first meal.  Heavy or fried foods are harder to digest and may make you feel nauseous or bloated.  Likewise meals heavy in dairy and vegetables can cause extra gas to form and this can also increase the bloating.  Drink plenty of fluids but you should avoid alcoholic beverages for 24 hours.  ACTIVITY: Your care partner should take you home directly after the procedure.  You should plan to take it easy, moving slowly for the rest of the day.  You can resume normal activity the day after the procedure however you should NOT DRIVE or use heavy machinery for 24 hours  (because of the sedation medicines used during the test).    SYMPTOMS TO REPORT IMMEDIATELY: A gastroenterologist can be reached at any hour.  During normal business hours, 8:30 AM to 5:00 PM Monday through Friday, call [phone].  After hours and on weekends, please call the GI answering service at [phone] who will take a message and have the physician on call contact you.   Following lower endoscopy (colonoscopy or flexible sigmoidoscopy):  Excessive amounts of blood in the stool  Significant tenderness or worsening of abdominal pains  Swelling of the abdomen that is new, acute  Fever of 100F or higher  Following upper endoscopy (EGD)  Vomiting of blood or coffee ground material  New chest pain or pain under the shoulder blades  Painful or persistently difficult swallowing  New shortness of breath  Fever of 100F or higher  Black, tarry-looking stools  FOLLOW UP: If any biopsies were taken you will be contacted by phone or by letter within the next 1-3 weeks.  Call your gastroenterologist if you have not heard about the biopsies in 3 weeks.  Our staff will call the home number listed on your records the next business day following your procedure to check on you and address any questions or concerns that you may have at that time regarding the information given to you following your procedure. This is a courtesy call and so if there is no answer at the home number and  we have not heard from you through the emergency physician on call, we will assume that you have returned to your regular daily activities without incident.  SIGNATURES/CONFIDENTIALITY: You and/or your care partner have signed paperwork which will be entered into your electronic medical record.  These signatures attest to the fact that that the information above on your After Visit Summary has been reviewed and is understood.  Full responsibility of the confidentiality of this discharge information lies with you  and/or your care-partner.   Please follow all discharge instructions given to you by the recovery room nurse. If you have any questions or problems after discharge please call one of the numbers listed above. You will receive a phone call in the am to see how you are doing and answer any questions you may have. Thank you for choosing [facility] for your health care needs.

## 2011-07-10 NOTE — Progress Notes (Signed)
Patient did not experience any of the following events: a burn prior to discharge; a fall within the facility; wrong site/side/patient/procedure/implant event; or a hospital transfer or hospital admission upon discharge from the facility. (G8907) Patient did not have preoperative order for IV antibiotic SSI prophylaxis. (G8918)  

## 2011-07-11 ENCOUNTER — Telehealth: Payer: Self-pay | Admitting: *Deleted

## 2011-07-11 NOTE — Telephone Encounter (Signed)
  Follow up Call-  Call back number 07/10/2011  Post procedure Call Back phone  # [phone]  Permission to leave phone message Yes     Patient questions:  Do you have a fever, pain , or abdominal swelling? no Pain Score  0 *  Have you tolerated food without any problems? yes  Have you been able to return to your normal activities? yes  Do you have any questions about your discharge instructions: Diet   no Medications  no Follow up visit  no  Do you have questions or concerns about your Care? no  Actions: * If pain score is 4 or above: No action needed, pain <4.

## 2011-07-15 ENCOUNTER — Encounter: Payer: Self-pay | Admitting: Gastroenterology

## 2011-07-15 LAB — HM COLONOSCOPY

## 2011-07-17 LAB — HM COLONOSCOPY

## 2011-07-19 ENCOUNTER — Ambulatory Visit (INDEPENDENT_AMBULATORY_CARE_PROVIDER_SITE_OTHER): Payer: Medicare Other | Admitting: *Deleted

## 2011-07-19 DIAGNOSIS — I4891 Unspecified atrial fibrillation: Secondary | ICD-10-CM

## 2011-07-19 LAB — POCT INR: INR: 1.6

## 2011-07-22 ENCOUNTER — Other Ambulatory Visit: Payer: Self-pay | Admitting: Cardiology

## 2011-07-22 ENCOUNTER — Telehealth: Payer: Self-pay | Admitting: Cardiology

## 2011-07-22 NOTE — Telephone Encounter (Signed)
New Problem:     I called the patient and was unable to reach them. I left a message on their voicemail with my name, the reason I called, the name of his physician, and a number to call back to schedule their appointment. 

## 2011-07-26 DIAGNOSIS — H903 Sensorineural hearing loss, bilateral: Secondary | ICD-10-CM | POA: Diagnosis not present

## 2011-08-01 ENCOUNTER — Other Ambulatory Visit: Payer: Self-pay | Admitting: Family Medicine

## 2011-08-02 ENCOUNTER — Encounter: Payer: Self-pay | Admitting: Internal Medicine

## 2011-08-02 ENCOUNTER — Ambulatory Visit (INDEPENDENT_AMBULATORY_CARE_PROVIDER_SITE_OTHER): Payer: Medicare Other | Admitting: Internal Medicine

## 2011-08-02 VITALS — BP 140/86 | HR 90 | Temp 98.4°F | Wt 149.0 lb

## 2011-08-02 DIAGNOSIS — I4891 Unspecified atrial fibrillation: Secondary | ICD-10-CM | POA: Diagnosis not present

## 2011-08-02 DIAGNOSIS — E7800 Pure hypercholesterolemia, unspecified: Secondary | ICD-10-CM

## 2011-08-02 DIAGNOSIS — I10 Essential (primary) hypertension: Secondary | ICD-10-CM | POA: Diagnosis not present

## 2011-08-02 DIAGNOSIS — E039 Hypothyroidism, unspecified: Secondary | ICD-10-CM

## 2011-08-02 NOTE — Assessment & Plan Note (Signed)
His recent TSH was normal 

## 2011-08-02 NOTE — Progress Notes (Signed)
  Subjective:    Patient ID: [name], male    DOB: March 13, 1945, 65 y.o.   MRN: [id number]  Hypertension This is a chronic problem. The current episode started more than 1 year ago. The problem is unchanged. The problem is uncontrolled. Pertinent negatives include no anxiety, blurred vision, chest pain, headaches, malaise/fatigue, neck pain, orthopnea, palpitations, peripheral edema, PND, shortness of breath or sweats. Past treatments include calcium channel blockers and ACE inhibitors. The current treatment provides moderate improvement. There are no compliance problems.  Hypertensive end-organ damage includes CAD/MI.      Review of Systems  Constitutional: Negative for fever, chills, malaise/fatigue, diaphoresis, activity change, appetite change, fatigue and unexpected weight change.  HENT: Negative.  Negative for neck pain.   Eyes: Negative for blurred vision.  Respiratory: Negative for cough, chest tightness, shortness of breath, wheezing and stridor.   Cardiovascular: Negative for chest pain, palpitations, orthopnea, leg swelling and PND.  Gastrointestinal: Negative for nausea, vomiting, abdominal pain, diarrhea, constipation and anal bleeding.  Genitourinary: Negative.   Musculoskeletal: Negative for myalgias, back pain, joint swelling, arthralgias and gait problem.  Skin: Negative for color change, pallor, rash and wound.  Neurological: Negative for dizziness, tremors, seizures, syncope, facial asymmetry, speech difficulty, weakness, light-headedness, numbness and headaches.  Hematological: Negative for adenopathy. Does not bruise/bleed easily.  Psychiatric/Behavioral: Negative.        Objective:   Physical Exam  Vitals reviewed. Constitutional: He is oriented to person, place, and time. He appears well-developed and well-nourished. No distress.  HENT:  Head: Normocephalic and atraumatic.  Mouth/Throat: Oropharynx is clear and moist. No oropharyngeal exudate.  Eyes:  Conjunctivae are normal. Right eye exhibits no discharge. Left eye exhibits no discharge. No scleral icterus.  Neck: Normal range of motion. Neck supple. No JVD present. No tracheal deviation present. No thyromegaly present.  Cardiovascular: Normal rate, S1 normal, S2 normal, normal heart sounds, intact distal pulses and normal pulses.  An irregularly irregular rhythm present. Exam reveals no gallop and no friction rub.   No murmur heard. Pulmonary/Chest: Effort normal and breath sounds normal. No stridor. No respiratory distress. He has no wheezes. He has no rales. He exhibits no tenderness.  Abdominal: Soft. Bowel sounds are normal. He exhibits no distension and no mass. There is no tenderness. There is no rebound and no guarding.  Musculoskeletal: Normal range of motion. He exhibits no edema and no tenderness.  Lymphadenopathy:    He has no cervical adenopathy.  Neurological: He is oriented to person, place, and time.  Skin: Skin is warm and dry. No rash noted. He is not diaphoretic. No erythema. No pallor.  Psychiatric: He has a normal mood and affect. His behavior is normal. Judgment and thought content normal.      Lab Results  Component Value Date   WBC 7.9 06/05/2011   HGB 15.1 06/05/2011   HCT 44.9 06/05/2011   PLT 168.0 06/05/2011   GLUCOSE 93 06/05/2011   CHOL 166 06/05/2011   TRIG 153.0* 06/05/2011   HDL 48.60 06/05/2011   LDLDIRECT 106.4 08/02/2010   LDLCALC 87 06/05/2011   ALT 19 06/05/2011   AST 23 06/05/2011   NA 139 06/05/2011   K 3.9 06/05/2011   CL 103 06/05/2011   CREATININE 1.1 06/05/2011   BUN 10 06/05/2011   CO2 29 06/05/2011   TSH 3.47 06/05/2011   PSA 1.46 08/02/2010   INR 1.6 07/19/2011   HGBA1C 4.9 06/05/2011      Assessment & Plan:

## 2011-08-02 NOTE — Assessment & Plan Note (Signed)
I talked to him about trying a new statin like Livalo but due to his prior side effects he will not try a statin at this time

## 2011-08-02 NOTE — Assessment & Plan Note (Signed)
His heart rate is a little high so I talked to him about a beta-blocker but he refuses to add another med at this ime

## 2011-08-02 NOTE — Assessment & Plan Note (Signed)
His SBP is a little high and with his hx of CAD/MI/A fib it would make sense for him to add a beta-blocker but he is not willing to add another med at this time

## 2011-08-06 ENCOUNTER — Ambulatory Visit (INDEPENDENT_AMBULATORY_CARE_PROVIDER_SITE_OTHER): Payer: Medicare Other

## 2011-08-06 DIAGNOSIS — I4891 Unspecified atrial fibrillation: Secondary | ICD-10-CM

## 2011-08-06 LAB — POCT INR: INR: 2.3

## 2011-08-22 ENCOUNTER — Other Ambulatory Visit: Payer: Self-pay | Admitting: Family Medicine

## 2011-08-27 ENCOUNTER — Ambulatory Visit (INDEPENDENT_AMBULATORY_CARE_PROVIDER_SITE_OTHER): Payer: Medicare Other | Admitting: Cardiology

## 2011-08-27 ENCOUNTER — Encounter: Payer: Self-pay | Admitting: Cardiology

## 2011-08-27 VITALS — BP 130/50 | HR 84 | Ht 65.0 in | Wt 152.0 lb

## 2011-08-27 DIAGNOSIS — I6529 Occlusion and stenosis of unspecified carotid artery: Secondary | ICD-10-CM | POA: Diagnosis not present

## 2011-08-27 DIAGNOSIS — E7800 Pure hypercholesterolemia, unspecified: Secondary | ICD-10-CM

## 2011-08-27 DIAGNOSIS — I679 Cerebrovascular disease, unspecified: Secondary | ICD-10-CM

## 2011-08-27 DIAGNOSIS — I4891 Unspecified atrial fibrillation: Secondary | ICD-10-CM

## 2011-08-27 NOTE — Progress Notes (Signed)
HPI: Mr. Weed is a gentleman who has a history of an acute anterior myocardial infarction occurring in the setting of atrial fibrillation, felt secondary to an embolic event. His LV function is preserved. We have been treating with rate control and anticoagulation at his request. His previous cardiac catheterization in August of 2009 revealed an occluded LAD but no other obstructive disease. The LAD occlusion was felt secondary to an embolus and resolved with thrombus aspiration. Echocardiogram in August of 2009 showed hypokinesis of the distal inferior septum and apical inferior wall but normal LV function. Carotid Dopplers in March of 2011 showed 0-39% bilateral stenosis and followup was recommended in 2 years. I last saw him in April of 2012. Since then the patient denies any dyspnea on exertion, orthopnea, PND, pedal edema, palpitations, syncope or exertional chest pain.    Current Outpatient Prescriptions  Medication Sig Dispense Refill  . Ascorbic Acid (VITAMIN C) 500 MG tablet Take 500 mg by mouth daily.        Marland Kitchen aspirin 81 MG tablet Take 2 tablets (162 mg total) by mouth daily. Take 2 tablets by mouth daily  30 tablet  11  . Calcium Carbonate-Vitamin D (CALCIUM-VITAMIN D) 600-200 MG-UNIT CAPS Take 1 tablet by mouth 2 (two) times daily.        Marland Kitchen diltiazem (CARDIZEM CD) 300 MG 24 hr capsule TAKE ONE CAPSULE BY MOUTH EVERY DAY  30 capsule  12  . Garlic Oil (SUPER GARLIC) 1000 MG CAPS Take 2 capsules by mouth daily.        Marland Kitchen lisinopril (PRINIVIL,ZESTRIL) 40 MG tablet TAKE ONE TABLET BY MOUTH EVERY DAY  90 tablet  3  . Magnesium 300 MG CAPS Take 1 capsule by mouth daily.        . Multiple Vitamin (MULTIVITAMIN) tablet Take 1 tablet by mouth daily.        . Tamsulosin HCl (FLOMAX) 0.4 MG CAPS TAKE ONE CAPSULE BY MOUTH EVERY DAY  90 capsule  3  . vitamin E 400 UNIT capsule Take 400 Units by mouth daily.        Marland Kitchen warfarin (COUMADIN) 5 MG tablet take as directed  45 tablet  11  . DISCONTD:  lovastatin (MEVACOR) 20 MG tablet Take 1 tablet (20 mg total) by mouth daily. At bedtime  30 tablet  11     Past Medical History  Diagnosis Date  . Atrial fibrillation   . Hyperlipidemia   . Anterior myocardial infarction     Presumed secondary to embolus from atrial fibrillation  . Hypertension   . Arthritis   . Hard of hearing   . Hemorrhoid   . Hx of adenomatous colonic polyps 07/2008    Past Surgical History  Procedure Date  . Stapedes surgery     History   Social History  . Marital Status: Married    Spouse Name: N/A    Number of Children: 6  . Years of Education: N/A   Occupational History  . Retired   .     Social History Main Topics  . Smoking status: Former Games developer  . Smokeless tobacco: Never Used  . Alcohol Use: No     occasional  . Drug Use: No  . Sexually Active: No   Other Topics Concern  . Not on file   Social History Narrative  . No narrative on file    ROS: no fevers or chills, productive cough, hemoptysis, dysphasia, odynophagia, melena, hematochezia, dysuria, hematuria, rash, seizure activity,  orthopnea, PND, pedal edema, claudication. Remaining systems are negative.  Physical Exam: Well-developed well-nourished in no acute distress.  Skin is warm and dry.  HEENT is normal.  Neck is supple.  Chest is clear to auscultation with normal expansion.  Cardiovascular exam is irregular Abdominal exam nontender or distended. No masses palpated. Extremities show no edema. neuro grossly intact  ECG atrial fibrillation at a rate of 84. Axis normal. No significant ST changes.

## 2011-08-27 NOTE — Assessment & Plan Note (Signed)
Plan continue Cardizem or rate control. Patient will need anticoagulation for life given previous embolic event. He will continue Coumadin. We discussed the advantages of the newer anticoagulants. He will check with his insurance company to see if they are covered and if so we will most likely change. Given use of Coumadin discontinue aspirin.

## 2011-08-27 NOTE — Assessment & Plan Note (Signed)
Blood pressure controlled. Continue present medications. Check potassium and renal function. 

## 2011-08-27 NOTE — Assessment & Plan Note (Signed)
Arrange followup carotid Dopplers. 

## 2011-08-27 NOTE — Patient Instructions (Addendum)
Your physician wants you to follow-up in: ONE YEAR WITH DR Shelda Pal will receive a reminder letter in the mail two months in advance. If you don't receive a letter, please call our office to schedule the follow-up appointment.   STOP ASPIRIN  Your physician has requested that you have a carotid duplex. This test is an ultrasound of the carotid arteries in your neck. It looks at blood flow through these arteries that supply the brain with blood. Allow one hour for this exam. There are no restrictions or special instructions.   Thurmond Butts

## 2011-08-27 NOTE — Assessment & Plan Note (Signed)
Previous myalgias with statins. Continue diet.

## 2011-09-03 ENCOUNTER — Ambulatory Visit (INDEPENDENT_AMBULATORY_CARE_PROVIDER_SITE_OTHER): Payer: Medicare Other | Admitting: Pharmacist

## 2011-09-03 DIAGNOSIS — I4891 Unspecified atrial fibrillation: Secondary | ICD-10-CM | POA: Diagnosis not present

## 2011-09-03 LAB — POCT INR: INR: 2

## 2011-09-04 NOTE — Addendum Note (Signed)
Addended by: Kem Parkinson on: 09/04/2011 03:55 PM   Modules accepted: Orders

## 2011-09-10 ENCOUNTER — Encounter (INDEPENDENT_AMBULATORY_CARE_PROVIDER_SITE_OTHER): Payer: Medicare Other

## 2011-09-10 DIAGNOSIS — I6529 Occlusion and stenosis of unspecified carotid artery: Secondary | ICD-10-CM

## 2011-10-15 ENCOUNTER — Ambulatory Visit (INDEPENDENT_AMBULATORY_CARE_PROVIDER_SITE_OTHER): Payer: Medicare Other | Admitting: *Deleted

## 2011-10-15 DIAGNOSIS — I4891 Unspecified atrial fibrillation: Secondary | ICD-10-CM | POA: Diagnosis not present

## 2011-11-12 ENCOUNTER — Ambulatory Visit (INDEPENDENT_AMBULATORY_CARE_PROVIDER_SITE_OTHER): Payer: Medicare Other | Admitting: *Deleted

## 2011-11-12 DIAGNOSIS — I4891 Unspecified atrial fibrillation: Secondary | ICD-10-CM

## 2011-11-12 LAB — POCT INR: INR: 1.6

## 2011-11-13 ENCOUNTER — Encounter: Payer: Self-pay | Admitting: General Practice

## 2011-11-26 ENCOUNTER — Ambulatory Visit (INDEPENDENT_AMBULATORY_CARE_PROVIDER_SITE_OTHER): Payer: Medicare Other | Admitting: *Deleted

## 2011-11-26 DIAGNOSIS — I4891 Unspecified atrial fibrillation: Secondary | ICD-10-CM | POA: Diagnosis not present

## 2011-12-03 IMAGING — CR DG SKULL 1-3V
1 series · 1 of 1 positions shown · non-contrast
Comparison: CT temporal bone both 03/21/2010.

CLINICAL DATA: 65-year-old male status post cochlear implant.

SKULL - 1-3 VIEW

[view not recorded]
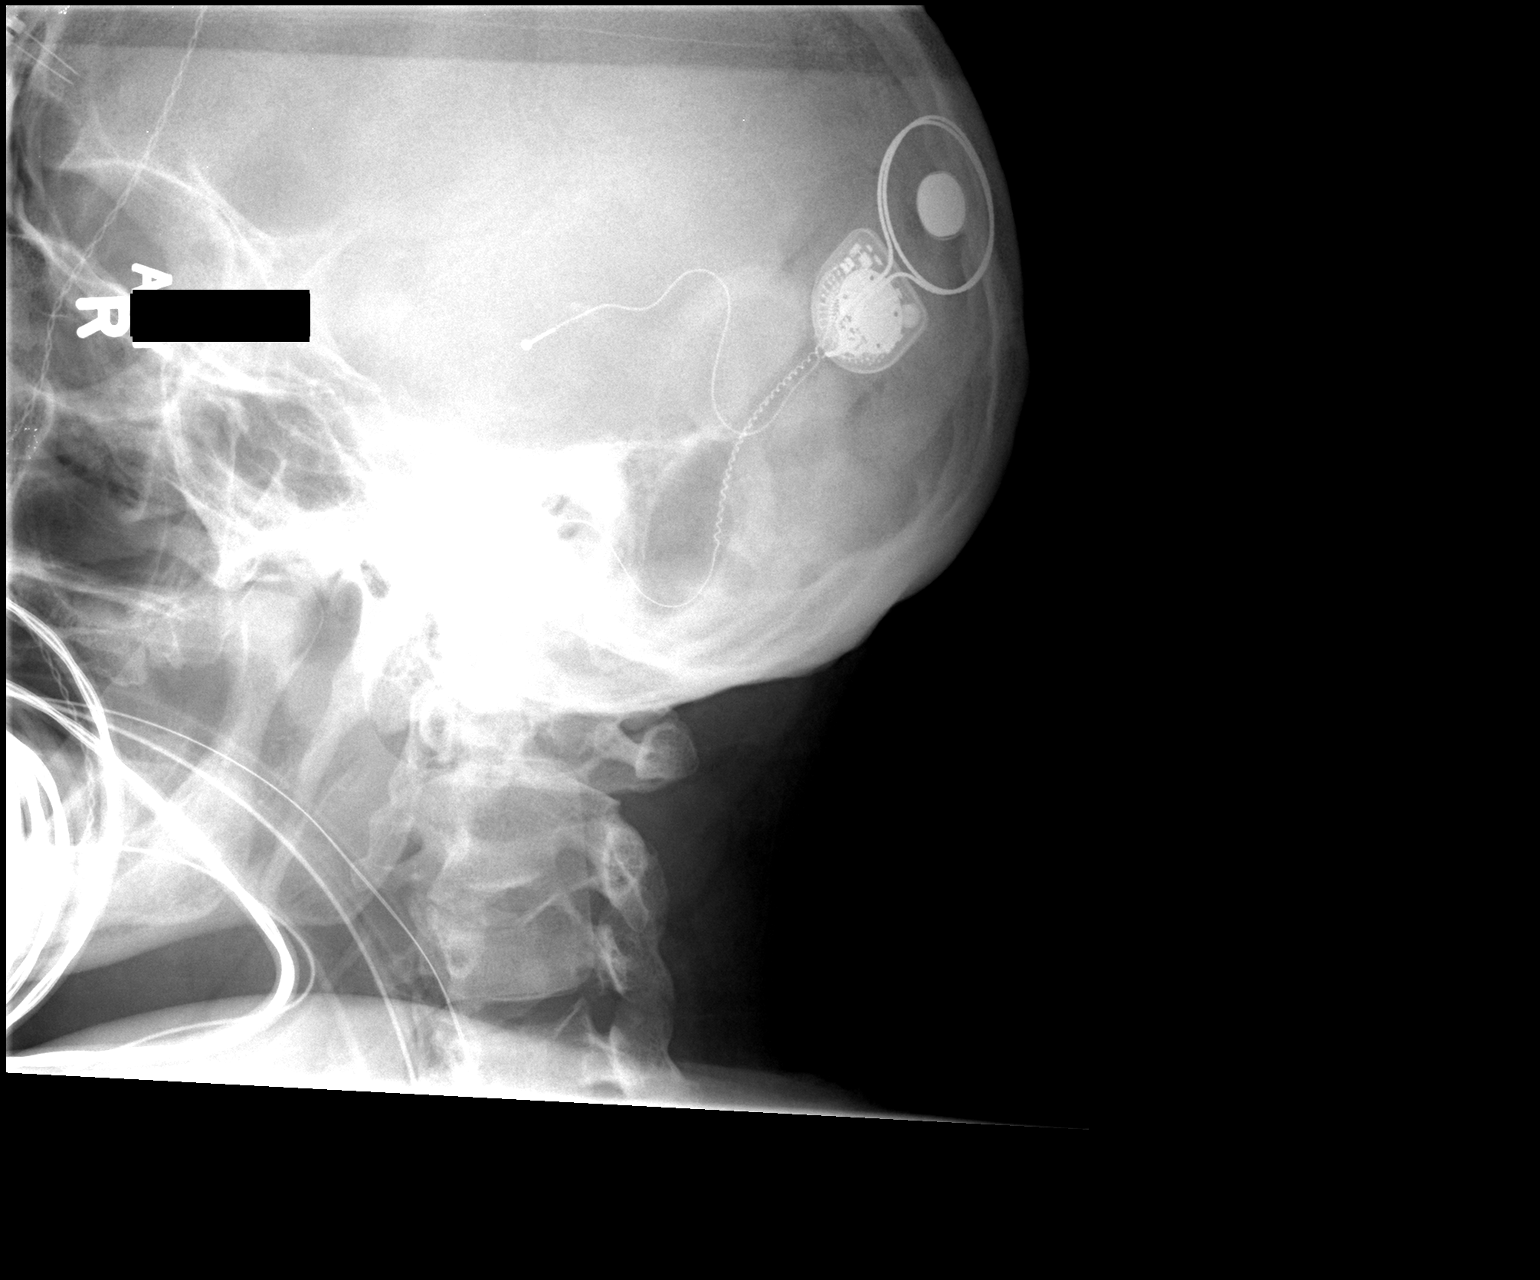

[1 of 1 positions shown; findings below may reference images not displayed]

FINDINGS: Single oblique intraoperative view of the skull labeled
right.  Generator device projects over the lateral convexity.
Electrode extends to the temporal bone, and terminates in the
region of the cochlea where it appears appropriately looped.
IMPRESSION: Right side cochlear implant depicted with no adverse features
evident.

## 2011-12-24 ENCOUNTER — Ambulatory Visit (INDEPENDENT_AMBULATORY_CARE_PROVIDER_SITE_OTHER): Payer: Medicare Other

## 2011-12-24 DIAGNOSIS — I4891 Unspecified atrial fibrillation: Secondary | ICD-10-CM

## 2011-12-24 LAB — POCT INR: INR: 1.7

## 2012-01-21 ENCOUNTER — Ambulatory Visit (INDEPENDENT_AMBULATORY_CARE_PROVIDER_SITE_OTHER): Payer: Medicare Other | Admitting: *Deleted

## 2012-01-21 DIAGNOSIS — I4891 Unspecified atrial fibrillation: Secondary | ICD-10-CM | POA: Diagnosis not present

## 2012-01-21 LAB — POCT INR: INR: 1.8

## 2012-01-31 DIAGNOSIS — H903 Sensorineural hearing loss, bilateral: Secondary | ICD-10-CM | POA: Diagnosis not present

## 2012-02-04 ENCOUNTER — Ambulatory Visit (INDEPENDENT_AMBULATORY_CARE_PROVIDER_SITE_OTHER): Payer: Medicare Other

## 2012-02-04 DIAGNOSIS — I4891 Unspecified atrial fibrillation: Secondary | ICD-10-CM

## 2012-02-04 LAB — POCT INR: INR: 2.1

## 2012-02-25 ENCOUNTER — Ambulatory Visit (INDEPENDENT_AMBULATORY_CARE_PROVIDER_SITE_OTHER): Payer: Medicare Other | Admitting: *Deleted

## 2012-02-25 DIAGNOSIS — I4891 Unspecified atrial fibrillation: Secondary | ICD-10-CM

## 2012-03-20 ENCOUNTER — Ambulatory Visit (INDEPENDENT_AMBULATORY_CARE_PROVIDER_SITE_OTHER): Payer: Medicare Other | Admitting: Internal Medicine

## 2012-03-20 ENCOUNTER — Encounter: Payer: Self-pay | Admitting: Internal Medicine

## 2012-03-20 VITALS — BP 124/86 | HR 93 | Temp 98.8°F | Ht 65.0 in | Wt 145.5 lb

## 2012-03-20 DIAGNOSIS — R05 Cough: Secondary | ICD-10-CM | POA: Diagnosis not present

## 2012-03-20 DIAGNOSIS — T44905A Adverse effect of unspecified drugs primarily affecting the autonomic nervous system, initial encounter: Secondary | ICD-10-CM

## 2012-03-20 DIAGNOSIS — I10 Essential (primary) hypertension: Secondary | ICD-10-CM | POA: Diagnosis not present

## 2012-03-20 MED ORDER — LOSARTAN POTASSIUM 50 MG PO TABS: 50.0000 mg | ORAL_TABLET | Freq: Every day | ORAL | Status: DC

## 2012-03-20 NOTE — Progress Notes (Signed)
Subjective:    Patient ID: [name], male    DOB: 05-13-1945, 67 y.o.   MRN: [id number]  HPI  Pt presents to the clinic today with c/o a dry cough for 3 weeks. He does not feel bad. He denies fever, fatigue or sputum production. The cough is intermittent but worse at night. She describes it as dry and hacking. He has never had a cough that lasted this long. He has been taking Nyquil without relief.  Review of Systems      Past Medical History  Diagnosis Date  . Atrial fibrillation   . Hyperlipidemia   . Anterior myocardial infarction     Presumed secondary to embolus from atrial fibrillation  . Hypertension   . Arthritis   . Hard of hearing   . Hemorrhoid   . Hx of adenomatous colonic polyps 07/2008    Current Outpatient Prescriptions  Medication Sig Dispense Refill  . Ascorbic Acid (VITAMIN C) 500 MG tablet Take 500 mg by mouth daily.        . Calcium Carbonate-Vitamin D (CALCIUM-VITAMIN D) 600-200 MG-UNIT CAPS Take 1 tablet by mouth 2 (two) times daily.        Marland Kitchen diltiazem (CARDIZEM CD) 300 MG 24 hr capsule TAKE ONE CAPSULE BY MOUTH EVERY DAY  30 capsule  12  . Garlic Oil (SUPER GARLIC) 1000 MG CAPS Take 2 capsules by mouth daily.        . Magnesium 300 MG CAPS Take 1 capsule by mouth daily.        . Multiple Vitamin (MULTIVITAMIN) tablet Take 1 tablet by mouth daily.        . Tamsulosin HCl (FLOMAX) 0.4 MG CAPS TAKE ONE CAPSULE BY MOUTH EVERY DAY  90 capsule  3  . vitamin E 400 UNIT capsule Take 400 Units by mouth daily.        Marland Kitchen warfarin (COUMADIN) 5 MG tablet take as directed  45 tablet  11  . losartan (COZAAR) 50 MG tablet Take 1 tablet (50 mg total) by mouth daily.  90 tablet  3  . [DISCONTINUED] lovastatin (MEVACOR) 20 MG tablet Take 1 tablet (20 mg total) by mouth daily. At bedtime  30 tablet  11    Allergies  Allergen Reactions  . Lipitor (Atorvastatin Calcium)     Muscle aches  . Lovastatin     Muscle aches    Family History  Problem Relation Age of  Onset  . Heart disease Mother   . Cancer Neg Hx   . Stroke Neg Hx   . Hypertension Neg Hx   . Hyperlipidemia Neg Hx   . Diabetes Neg Hx     History   Social History  . Marital Status: Married    Spouse Name: N/A    Number of Children: 6  . Years of Education: N/A   Occupational History  . Retired   .     Social History Main Topics  . Smoking status: Former Games developer  . Smokeless tobacco: Never Used  . Alcohol Use: No     Comment: occasional  . Drug Use: No  . Sexually Active: No   Other Topics Concern  . Not on file   Social History Narrative  . No narrative on file     Constitutional: Denies fever, malaise, fatigue, headache or abrupt weight changes.  HEENT: Denies eye pain, eye redness, ear pain, ringing in the ears, wax buildup, runny nose, nasal congestion, bloody nose, or sore throat.  Respiratory: Pt reports cough. Denies difficulty breathing, shortness of breath, cough or sputum production.   Cardiovascular: Denies chest pain, chest tightness, palpitations or swelling in the hands or feet.     No other specific complaints in a complete review of systems (except as listed in HPI above).  Objective:   Physical Exam   BP 124/86  Pulse 93  Temp 98.8 F (37.1 C) (Oral)  Ht 5\' 5"  (1.651 m)  Wt 145 lb 8 oz (65.998 kg)  BMI 24.21 kg/m2  SpO2 97% Wt Readings from Last 3 Encounters:  03/20/12 145 lb 8 oz (65.998 kg)  08/27/11 152 lb (68.947 kg)  08/02/11 149 lb (67.586 kg)    General: Appears his stated age, well developed, well nourished in NAD. HEENT: Head: normal shape and size; Eyes: sclera white, no icterus, conjunctiva pink, PERRLA and EOMs intact; Ears: Tm's gray and intact, normal light reflex; Nose: mucosa pink and moist, septum midline; Throat/Mouth: Teeth present, mucosa pink and moist, no exudate, lesions or ulcerations noted.  Neck: Normal range of motion. Neck supple, trachea midline. No massses, lumps or thyromegaly present.  Cardiovascular:  Normal rate and rhythm. S1,S2 noted.  No murmur, rubs or gallops noted. No JVD or BLE edema. No carotid bruits noted. Pulmonary/Chest: Normal effort and positive vesicular breath sounds. No respiratory distress. No wheezes, rales or ronchi noted.        Assessment & Plan:   Lisinopril induced cough, new onset with additional workup required:  Stop lisinopril today Start Cozaar 50 mg tommorow Monitor blood pressure at home.  RTC in 1  Month for blood pressure check

## 2012-03-20 NOTE — Patient Instructions (Signed)

## 2012-03-24 ENCOUNTER — Ambulatory Visit (INDEPENDENT_AMBULATORY_CARE_PROVIDER_SITE_OTHER): Payer: Medicare Other | Admitting: *Deleted

## 2012-03-24 DIAGNOSIS — I4891 Unspecified atrial fibrillation: Secondary | ICD-10-CM

## 2012-03-24 LAB — POCT INR: INR: 1.7

## 2012-04-14 ENCOUNTER — Ambulatory Visit (INDEPENDENT_AMBULATORY_CARE_PROVIDER_SITE_OTHER): Payer: Medicare Other | Admitting: *Deleted

## 2012-04-14 DIAGNOSIS — I4891 Unspecified atrial fibrillation: Secondary | ICD-10-CM | POA: Diagnosis not present

## 2012-04-20 ENCOUNTER — Encounter: Payer: Self-pay | Admitting: Internal Medicine

## 2012-04-20 ENCOUNTER — Ambulatory Visit (INDEPENDENT_AMBULATORY_CARE_PROVIDER_SITE_OTHER): Payer: Medicare Other | Admitting: Internal Medicine

## 2012-04-20 ENCOUNTER — Other Ambulatory Visit (INDEPENDENT_AMBULATORY_CARE_PROVIDER_SITE_OTHER): Payer: Medicare Other

## 2012-04-20 VITALS — BP 122/86 | HR 88 | Temp 97.6°F | Resp 16 | Wt 146.5 lb

## 2012-04-20 DIAGNOSIS — I10 Essential (primary) hypertension: Secondary | ICD-10-CM

## 2012-04-20 DIAGNOSIS — E039 Hypothyroidism, unspecified: Secondary | ICD-10-CM

## 2012-04-20 DIAGNOSIS — Z0000 Encounter for general adult medical examination without abnormal findings: Secondary | ICD-10-CM | POA: Diagnosis not present

## 2012-04-20 DIAGNOSIS — I4891 Unspecified atrial fibrillation: Secondary | ICD-10-CM | POA: Diagnosis not present

## 2012-04-20 DIAGNOSIS — N402 Nodular prostate without lower urinary tract symptoms: Secondary | ICD-10-CM

## 2012-04-20 DIAGNOSIS — N400 Enlarged prostate without lower urinary tract symptoms: Secondary | ICD-10-CM | POA: Insufficient documentation

## 2012-04-20 DIAGNOSIS — Z125 Encounter for screening for malignant neoplasm of prostate: Secondary | ICD-10-CM | POA: Insufficient documentation

## 2012-04-20 LAB — URINALYSIS, ROUTINE W REFLEX MICROSCOPIC
Ketones, ur: NEGATIVE
Leukocytes, UA: NEGATIVE
Nitrite: NEGATIVE
Specific Gravity, Urine: 1.015 (ref 1.000–1.030)
Urobilinogen, UA: 0.2 (ref 0.0–1.0)

## 2012-04-20 LAB — COMPREHENSIVE METABOLIC PANEL
ALT: 17 U/L (ref 0–53)
BUN: 14 mg/dL (ref 6–23)
CO2: 28 mEq/L (ref 19–32)
Calcium: 9.8 mg/dL (ref 8.4–10.5)
Creatinine, Ser: 1.1 mg/dL (ref 0.4–1.5)
GFR: 83.83 mL/min (ref >60.00)
Total Bilirubin: 1.1 mg/dL (ref 0.3–1.2)

## 2012-04-20 LAB — CBC WITH DIFFERENTIAL/PLATELET
Basophils Relative: 0.8 % (ref 0.0–3.0)
Eosinophils Relative: 1.3 % (ref 0.0–5.0)
HCT: 43.8 % (ref 39.0–52.0)
Hemoglobin: 14.9 g/dL (ref 13.0–17.0)
Lymphocytes Relative: 27.1 % (ref 12.0–46.0)
Lymphs Abs: 2 10*3/uL (ref 0.7–4.0)
Monocytes Relative: 10.5 % (ref 3.0–12.0)
Neutro Abs: 4.4 10*3/uL (ref 1.4–7.7)
RBC: 4.55 Mil/uL (ref 4.22–5.81)
RDW: 12.4 % (ref 11.5–14.6)
WBC: 7.2 10*3/uL (ref 4.5–10.5)

## 2012-04-20 LAB — TSH: TSH: 3.39 u[IU]/mL (ref 0.35–5.50)

## 2012-04-20 LAB — FECAL OCCULT BLOOD, GUAIAC: Fecal Occult Blood: NEGATIVE

## 2012-04-20 NOTE — Progress Notes (Signed)
Subjective:    Patient ID: [name], male    DOB: [birth year], 67 y.o.   MRN: [id number]  Hypertension This is a chronic problem. The current episode started more than 1 year ago. The problem has been gradually improving since onset. The problem is controlled. Pertinent negatives include no anxiety, blurred vision, chest pain, headaches, malaise/fatigue, neck pain, orthopnea, palpitations, peripheral edema, PND, shortness of breath or sweats. Past treatments include calcium channel blockers and angiotensin blockers. The current treatment provides moderate improvement. There are no compliance problems.  Hypertensive end-organ damage includes CAD/MI.      Review of Systems  Constitutional: Negative for fever, chills, malaise/fatigue, diaphoresis, activity change, appetite change, fatigue and unexpected weight change.  HENT: Negative.  Negative for neck pain.   Eyes: Negative.  Negative for blurred vision.  Respiratory: Negative for apnea, cough, choking, chest tightness, shortness of breath, wheezing and stridor.   Cardiovascular: Negative for chest pain, palpitations, orthopnea, leg swelling and PND.  Gastrointestinal: Negative.   Endocrine: Negative.   Genitourinary: Negative.  Negative for dysuria, urgency, frequency, decreased urine volume and difficulty urinating.  Musculoskeletal: Negative for myalgias, back pain, joint swelling, arthralgias and gait problem.  Skin: Negative.   Neurological: Negative for dizziness, tremors, speech difficulty, weakness, light-headedness and headaches.  Hematological: Negative for adenopathy. Does not bruise/bleed easily.  Psychiatric/Behavioral: Negative.        Objective:   Physical Exam  Vitals reviewed. Constitutional: He is oriented to person, place, and time. He appears well-developed and well-nourished. No distress.  HENT:  Head: Normocephalic and atraumatic.  Mouth/Throat: Oropharynx is clear and moist. No oropharyngeal exudate.   Eyes: Conjunctivae are normal. Right eye exhibits no discharge. Left eye exhibits no discharge. No scleral icterus.  Neck: Normal range of motion. Neck supple. No JVD present. No tracheal deviation present. No thyromegaly present.  Cardiovascular: Normal rate, S1 normal, S2 normal and intact distal pulses.  An irregularly irregular rhythm present. Exam reveals no gallop.   No murmur heard.  No diastolic murmur is present  Pulses:      Carotid pulses are 1+ on the right side, and 1+ on the left side.      Radial pulses are 1+ on the right side, and 1+ on the left side.       Femoral pulses are 1+ on the right side, and 1+ on the left side.      Popliteal pulses are 1+ on the right side, and 1+ on the left side.       Dorsalis pedis pulses are 1+ on the right side, and 1+ on the left side.       Posterior tibial pulses are 1+ on the right side, and 1+ on the left side.  Pulmonary/Chest: Effort normal and breath sounds normal. No stridor. No respiratory distress. He has no wheezes. He has no rales. He exhibits no tenderness.  Abdominal: Soft. Bowel sounds are normal. He exhibits no distension and no mass. There is no tenderness. There is no rebound and no guarding. Hernia confirmed negative in the right inguinal area and confirmed negative in the left inguinal area.  Genitourinary: Rectum normal, testes normal and penis normal. Rectal exam shows no external hemorrhoid, no internal hemorrhoid, no fissure, no mass, no tenderness and anal tone normal. Guaiac negative stool. Prostate is enlarged (1+ smooth R lobe >> L lobe). Prostate is not tender. Right testis shows no mass, no swelling and no tenderness. Right testis is descended. Left testis shows no  mass, no swelling and no tenderness. Left testis is descended. Uncircumcised. No phimosis, paraphimosis, hypospadias, penile erythema or penile tenderness. No discharge found.  Musculoskeletal: Normal range of motion. He exhibits no edema and no tenderness.   Lymphadenopathy:    He has no cervical adenopathy.       Right: No inguinal adenopathy present.       Left: No inguinal adenopathy present.  Neurological: He is oriented to person, place, and time.  Skin: Skin is warm and dry. No rash noted. He is not diaphoretic. No erythema. No pallor.  Psychiatric: He has a normal mood and affect. His behavior is normal. Judgment and thought content normal.     Lab Results  Component Value Date   WBC 7.9 06/05/2011   HGB 15.1 06/05/2011   HCT 44.9 06/05/2011   PLT 168.0 06/05/2011   GLUCOSE 93 06/05/2011   CHOL 166 06/05/2011   TRIG 153.0* 06/05/2011   HDL 48.60 06/05/2011   LDLDIRECT 106.4 08/02/2010   LDLCALC 87 06/05/2011   ALT 19 06/05/2011   AST 23 06/05/2011   NA 139 06/05/2011   K 3.9 06/05/2011   CL 103 06/05/2011   CREATININE 1.1 06/05/2011   BUN 10 06/05/2011   CO2 29 06/05/2011   TSH 3.47 06/05/2011   PSA 1.46 08/02/2010   INR 2.1 04/14/2012   HGBA1C 4.9 06/05/2011       Assessment & Plan:

## 2012-04-20 NOTE — Assessment & Plan Note (Signed)
I will check his TSh today and will adjust his dose if needed 

## 2012-04-20 NOTE — Patient Instructions (Signed)
Health Maintenance, Males A healthy lifestyle and preventative care can promote health and wellness.  Maintain regular health, dental, and eye exams.  Eat a healthy diet. Foods like vegetables, fruits, whole grains, low-fat dairy products, and lean protein foods contain the nutrients you need without too many calories. Decrease your intake of foods high in solid fats, added sugars, and salt. Get information about a proper diet from your caregiver, if necessary.  Regular physical exercise is one of the most important things you can do for your health. Most adults should get at least 150 minutes of moderate-intensity exercise (any activity that increases your heart rate and causes you to sweat) each week. In addition, most adults need muscle-strengthening exercises on 2 or more days a week.   Maintain a healthy weight. The body mass index (BMI) is a screening tool to identify possible weight problems. It provides an estimate of body fat based on height and weight. Your caregiver can help determine your BMI, and can help you achieve or maintain a healthy weight. For adults 20 years and older:  A BMI below 18.5 is considered underweight.  A BMI of 18.5 to 24.9 is normal.  A BMI of 25 to 29.9 is considered overweight.  A BMI of 30 and above is considered obese.  Maintain normal blood lipids and cholesterol by exercising and minimizing your intake of saturated fat. Eat a balanced diet with plenty of fruits and vegetables. Blood tests for lipids and cholesterol should begin at age 20 and be repeated every 5 years. If your lipid or cholesterol levels are high, you are over 50, or you are a high risk for heart disease, you may need your cholesterol levels checked more frequently.Ongoing high lipid and cholesterol levels should be treated with medicines, if diet and exercise are not effective.  If you smoke, find out from your caregiver how to quit. If you do not use tobacco, do not start.  If you  choose to drink alcohol, do not exceed 2 drinks per day. One drink is considered to be 12 ounces (355 mL) of beer, 5 ounces (148 mL) of wine, or 1.5 ounces (44 mL) of liquor.  Avoid use of street drugs. Do not share needles with anyone. Ask for help if you need support or instructions about stopping the use of drugs.  High blood pressure causes heart disease and increases the risk of stroke. Blood pressure should be checked at least every 1 to 2 years. Ongoing high blood pressure should be treated with medicines if weight loss and exercise are not effective.  If you are 45 to 68 years old, ask your caregiver if you should take aspirin to prevent heart disease.  Diabetes screening involves taking a blood sample to check your fasting blood sugar level. This should be done once every 3 years, after age 45, if you are within normal weight and without risk factors for diabetes. Testing should be considered at a younger age or be carried out more frequently if you are overweight and have at least 1 risk factor for diabetes.  Colorectal cancer can be detected and often prevented. Most routine colorectal cancer screening begins at the age of 50 and continues through age 75. However, your caregiver may recommend screening at an earlier age if you have risk factors for colon cancer. On a yearly basis, your caregiver may provide home test kits to check for hidden blood in the stool. Use of a small camera at the end of a tube,   to directly examine the colon (sigmoidoscopy or colonoscopy), can detect the earliest forms of colorectal cancer. Talk to your caregiver about this at age 50, when routine screening begins. Direct examination of the colon should be repeated every 5 to 10 years through age 75, unless early forms of pre-cancerous polyps or small growths are found.  Hepatitis C blood testing is recommended for all people born from 1945 through 1965 and any individual with known risks for hepatitis C.  Healthy  men should no longer receive prostate-specific antigen (PSA) blood tests as part of routine cancer screening. Consult with your caregiver about prostate cancer screening.  Testicular cancer screening is not recommended for adolescents or adult males who have no symptoms. Screening includes self-exam, caregiver exam, and other screening tests. Consult with your caregiver about any symptoms you have or any concerns you have about testicular cancer.  Practice safe sex. Use condoms and avoid high-risk sexual practices to reduce the spread of sexually transmitted infections (STIs).  Use sunscreen with a sun protection factor (SPF) of 30 or greater. Apply sunscreen liberally and repeatedly throughout the day. You should seek shade when your shadow is shorter than you. Protect yourself by wearing long sleeves, pants, a wide-brimmed hat, and sunglasses year round, whenever you are outdoors.  Notify your caregiver of new moles or changes in moles, especially if there is a change in shape or color. Also notify your caregiver if a mole is larger than the size of a pencil eraser.  A one-time screening for abdominal aortic aneurysm (AAA) and surgical repair of large AAAs by sound wave imaging (ultrasonography) is recommended for ages 65 to 75 years who are current or former smokers.  Stay current with your immunizations. Document Released: 08/24/2007 Document Revised: 05/20/2011 Document Reviewed: 07/23/2010 [employer] Patient Information 2013 [employer].  

## 2012-04-20 NOTE — Assessment & Plan Note (Signed)
His BP is adequately controlled.

## 2012-04-20 NOTE — Assessment & Plan Note (Signed)
I will check his PSA today 

## 2012-04-20 NOTE — Assessment & Plan Note (Signed)
He has good rate control today 

## 2012-04-20 NOTE — Assessment & Plan Note (Addendum)

## 2012-05-13 ENCOUNTER — Ambulatory Visit (INDEPENDENT_AMBULATORY_CARE_PROVIDER_SITE_OTHER): Payer: Medicare Other | Admitting: *Deleted

## 2012-05-13 DIAGNOSIS — I4891 Unspecified atrial fibrillation: Secondary | ICD-10-CM

## 2012-06-23 ENCOUNTER — Ambulatory Visit (INDEPENDENT_AMBULATORY_CARE_PROVIDER_SITE_OTHER): Payer: Medicare Other | Admitting: *Deleted

## 2012-06-23 DIAGNOSIS — I4891 Unspecified atrial fibrillation: Secondary | ICD-10-CM

## 2012-06-23 LAB — POCT INR: INR: 3.2

## 2012-07-18 ENCOUNTER — Other Ambulatory Visit: Payer: Self-pay | Admitting: Internal Medicine

## 2012-08-01 ENCOUNTER — Other Ambulatory Visit: Payer: Self-pay | Admitting: Internal Medicine

## 2012-08-04 ENCOUNTER — Ambulatory Visit (INDEPENDENT_AMBULATORY_CARE_PROVIDER_SITE_OTHER): Payer: Medicare Other | Admitting: Pharmacist

## 2012-08-04 DIAGNOSIS — I4891 Unspecified atrial fibrillation: Secondary | ICD-10-CM | POA: Diagnosis not present

## 2012-08-04 LAB — POCT INR: INR: 2.8

## 2012-08-17 ENCOUNTER — Other Ambulatory Visit: Payer: Self-pay | Admitting: Cardiology

## 2012-09-15 ENCOUNTER — Ambulatory Visit (INDEPENDENT_AMBULATORY_CARE_PROVIDER_SITE_OTHER): Payer: Medicare Other | Admitting: *Deleted

## 2012-09-15 DIAGNOSIS — I4891 Unspecified atrial fibrillation: Secondary | ICD-10-CM

## 2012-09-15 LAB — POCT INR: INR: 2.1

## 2012-10-21 ENCOUNTER — Encounter: Payer: Self-pay | Admitting: Internal Medicine

## 2012-10-21 ENCOUNTER — Other Ambulatory Visit (INDEPENDENT_AMBULATORY_CARE_PROVIDER_SITE_OTHER): Payer: Medicare Other

## 2012-10-21 ENCOUNTER — Telehealth: Payer: Self-pay

## 2012-10-21 ENCOUNTER — Ambulatory Visit (INDEPENDENT_AMBULATORY_CARE_PROVIDER_SITE_OTHER): Payer: Medicare Other | Admitting: Internal Medicine

## 2012-10-21 ENCOUNTER — Ambulatory Visit (INDEPENDENT_AMBULATORY_CARE_PROVIDER_SITE_OTHER)
Admission: RE | Admit: 2012-10-21 | Discharge: 2012-10-21 | Disposition: A | Discharge status: Home / Self Care | Payer: Medicare Other | Source: Ambulatory Visit | Attending: Internal Medicine | Admitting: Internal Medicine

## 2012-10-21 VITALS — BP 134/84 | HR 58 | Temp 98.1°F | Resp 16 | Wt 147.0 lb

## 2012-10-21 DIAGNOSIS — R350 Frequency of micturition: Secondary | ICD-10-CM | POA: Insufficient documentation

## 2012-10-21 DIAGNOSIS — I10 Essential (primary) hypertension: Secondary | ICD-10-CM | POA: Diagnosis not present

## 2012-10-21 DIAGNOSIS — N410 Acute prostatitis: Secondary | ICD-10-CM | POA: Insufficient documentation

## 2012-10-21 DIAGNOSIS — K5900 Constipation, unspecified: Secondary | ICD-10-CM | POA: Diagnosis not present

## 2012-10-21 DIAGNOSIS — K5909 Other constipation: Secondary | ICD-10-CM

## 2012-10-21 DIAGNOSIS — R109 Unspecified abdominal pain: Secondary | ICD-10-CM

## 2012-10-21 DIAGNOSIS — E039 Hypothyroidism, unspecified: Secondary | ICD-10-CM

## 2012-10-21 DIAGNOSIS — N402 Nodular prostate without lower urinary tract symptoms: Secondary | ICD-10-CM

## 2012-10-21 LAB — URINALYSIS, ROUTINE W REFLEX MICROSCOPIC
Leukocytes, UA: NEGATIVE
Nitrite: NEGATIVE
Specific Gravity, Urine: 1.01 (ref 1.000–1.030)
Total Protein, Urine: NEGATIVE
pH: 6 (ref 5.0–8.0)

## 2012-10-21 LAB — POCT URINALYSIS DIPSTICK
Bilirubin, UA: NEGATIVE
Blood, UA: NEGATIVE
Glucose, UA: NEGATIVE
Leukocytes, UA: NEGATIVE
Nitrite, UA: NEGATIVE
Urobilinogen, UA: 0.2

## 2012-10-21 LAB — CBC WITH DIFFERENTIAL/PLATELET
Basophils Relative: 0.5 % (ref 0.0–3.0)
Eosinophils Relative: 1.9 % (ref 0.0–5.0)
HCT: 45.6 % (ref 39.0–52.0)
Lymphs Abs: 1.8 10*3/uL (ref 0.7–4.0)
MCV: 96.9 fl (ref 78.0–100.0)
Monocytes Absolute: 0.9 10*3/uL (ref 0.1–1.0)
Neutro Abs: 5.2 10*3/uL (ref 1.4–7.7)
Platelets: 165 10*3/uL (ref 150.0–400.0)
RBC: 4.71 Mil/uL (ref 4.22–5.81)
WBC: 8.1 10*3/uL (ref 4.5–10.5)

## 2012-10-21 LAB — COMPREHENSIVE METABOLIC PANEL
Alkaline Phosphatase: 61 U/L (ref 39–117)
Glucose, Bld: 115 mg/dL — ABNORMAL HIGH (ref 70–99)
Total Bilirubin: 0.9 mg/dL (ref 0.3–1.2)
Total Protein: 7.6 g/dL (ref 6.0–8.3)

## 2012-10-21 MED ORDER — LINACLOTIDE 145 MCG PO CAPS: 145.0000 ug | ORAL_CAPSULE | Freq: Every day | ORAL | Status: DC

## 2012-10-21 MED ORDER — SILODOSIN 8 MG PO CAPS: 8.0000 mg | ORAL_CAPSULE | Freq: Every day | ORAL | Status: DC

## 2012-10-21 MED ORDER — CIPROFLOXACIN HCL 500 MG PO TABS: 500.0000 mg | ORAL_TABLET | Freq: Two times a day (BID) | ORAL | Status: DC

## 2012-10-21 NOTE — Progress Notes (Signed)
Subjective:    Patient ID: [name], male    DOB: 1945/06/21, 67 y.o.   MRN: [id number]  Benign Prostatic Hypertrophy This is a recurrent problem. The current episode started more than 1 month ago. The problem has been gradually worsening since onset. Irritative symptoms include frequency and nocturia. Irritative symptoms do not include urgency. Obstructive symptoms include dribbling, incomplete emptying, an intermittent stream, a slower stream, straining and a weak stream. Pertinent negatives include no chills, dysuria, genital pain, hematuria, hesitancy, nausea or vomiting. AUA score is 8-19. He is sexually active. Nothing aggravates the symptoms. Past treatments include tamsulosin. The treatment provided no relief. He has been using treatment for 1 to 2 years.      Review of Systems  Constitutional: Negative.  Negative for fever, chills, diaphoresis, activity change, appetite change, fatigue and unexpected weight change.  HENT: Negative.   Eyes: Negative.   Respiratory: Negative.  Negative for cough, chest tightness, shortness of breath, wheezing and stridor.   Cardiovascular: Negative.  Negative for chest pain, palpitations and leg swelling.  Gastrointestinal: Positive for abdominal pain (for one month), constipation (worsening over the last 5 years) and abdominal distention (for one month). Negative for nausea, vomiting, diarrhea, blood in stool, anal bleeding and rectal pain.  Endocrine: Negative.   Genitourinary: Positive for frequency, difficulty urinating, incomplete emptying and nocturia. Negative for dysuria, hesitancy, urgency, hematuria, flank pain, decreased urine volume, discharge, penile swelling, scrotal swelling, enuresis, genital sores, penile pain and testicular pain.  Musculoskeletal: Negative.   Skin: Negative.   Allergic/Immunologic: Negative.   Neurological: Negative.   Hematological: Negative.  Negative for adenopathy. Does not bruise/bleed easily.   Psychiatric/Behavioral: Negative.        Objective:   Physical Exam  Vitals reviewed. Constitutional: He is oriented to person, place, and time. He appears well-developed and well-nourished. No distress.  HENT:  Head: Normocephalic and atraumatic.  Mouth/Throat: Oropharynx is clear and moist. No oropharyngeal exudate.  Eyes: Conjunctivae are normal. Right eye exhibits no discharge. Left eye exhibits no discharge. No scleral icterus.  Neck: Normal range of motion. Neck supple. No JVD present. No tracheal deviation present. No thyromegaly present.  Cardiovascular: Normal rate, normal heart sounds and intact distal pulses.  An irregularly irregular rhythm present. Exam reveals no gallop and no friction rub.   No murmur heard. Pulmonary/Chest: Effort normal and breath sounds normal. No stridor. No respiratory distress. He has no wheezes. He has no rales. He exhibits no tenderness.  Abdominal: Soft. Bowel sounds are normal. He exhibits no distension and no mass. There is no tenderness. There is no rebound and no guarding. Hernia confirmed negative in the right inguinal area and confirmed negative in the left inguinal area.  Genitourinary: Rectum normal, testes normal and penis normal. Rectal exam shows no external hemorrhoid, no internal hemorrhoid, no fissure, no mass, no tenderness and anal tone normal. Guaiac negative stool. Prostate is enlarged and tender. Right testis shows no mass, no swelling and no tenderness. Right testis is descended. Left testis shows no mass, no swelling and no tenderness. Left testis is descended. Circumcised. No penile erythema or penile tenderness. No discharge found.  Left lobe of prostate gland is tender, boggy, and slightly larger than the right side.  Musculoskeletal: Normal range of motion. He exhibits no edema and no tenderness.  Lymphadenopathy:    He has no cervical adenopathy.       Right: No inguinal adenopathy present.       Left: No inguinal adenopathy  present.  Neurological: He is oriented to person, place, and time.  Skin: Skin is warm and dry. No rash noted. He is not diaphoretic. No erythema. No pallor.  Psychiatric: He has a normal mood and affect. His behavior is normal. Judgment and thought content normal.     Lab Results  Component Value Date   WBC 7.2 04/20/2012   HGB 14.9 04/20/2012   HCT 43.8 04/20/2012   PLT 171.0 04/20/2012   GLUCOSE 99 04/20/2012   CHOL 166 06/05/2011   TRIG 153.0* 06/05/2011   HDL 48.60 06/05/2011   LDLDIRECT 106.4 08/02/2010   LDLCALC 87 06/05/2011   ALT 17 04/20/2012   AST 22 04/20/2012   NA 139 04/20/2012   K 3.6 04/20/2012   CL 102 04/20/2012   CREATININE 1.1 04/20/2012   BUN 14 04/20/2012   CO2 28 04/20/2012   TSH 3.39 04/20/2012   PSA 2.18 04/20/2012   INR 2.1 09/15/2012   HGBA1C 4.9 06/05/2011       Assessment & Plan:

## 2012-10-21 NOTE — Assessment & Plan Note (Signed)
Will treat the infection with cipro 

## 2012-10-21 NOTE — Assessment & Plan Note (Signed)
I will recheck his TSH level and will adjust his dose if needed 

## 2012-10-21 NOTE — Patient Instructions (Signed)
Prostatitis  The prostate gland is about the size and shape of a walnut. It is located just below your bladder. It produces one of the components of semen, which is made up of sperm and the fluids that help nourish and transport it out from the testicles. Prostatitis is redness, soreness, and swelling (inflammation) of the prostate gland.   There are 3 types of prostatitis:  · Acute bacterial prostatitis This is the least common type of prostatitis. It starts quickly and usually leads to a bladder infection. It can occur at any age.  · Chronic bacterial prostatitis This is a persistent bacterial infection in the prostate.  It usually develops from repeated acute bacterial prostatitis or acute bacterial prostatitis that was not properly treated. It can occur in men of any age but is most common in middle-aged men whose prostate has begun to enlarge.   · Chronic prostatitis chronic pelvic pain syndrome This is the most common type of prostatitis. It is inflammation of the prostate gland that is not caused by a bacterial infection. The cause is unknown.  CAUSES  The cause of acute and chronic bacterial prostatitis is a bacterial infection. The exact cause of chronic prostatitis and chronic pelvic pain syndrome and asymptomatic inflammatory prostatitis is unknown.   SYMPTOMS   Symptoms can vary depending upon the type of prostatitis that exists. There can also be overlap in symptoms. Possible symptoms for each type of prostatitis are listed below.  Acute bacterial prostatitis  · Painful urination.  · Fever or chills.  · Muscle or joint pains.  · Low back pain.  · Low abdominal pain.  · Inability to empty bladder completely.  · Sudden urge to urinate.  · Frequent urination.  · Difficulty starting urine stream.  · Weak urine stream.  · Discharge from the urethra.  · Dribbling after urination.  · Rectal pain.  · Pain in the testicles, penis, or tip of the penis.  · Pain in the space between the anus and scrotum  (perineum).  · Problems with sexual function.  · Painful ejaculation.  · Bloody semen.  Chronic bacterial prostatitis  · The symptoms are similar to those of acute bacterial prostatitis, but they usually are much less severe. Fever, chills, and muscle and joint pain are not associated with chronic bacterial prostatitis.  Chronic prostatitis chronic pelvic pain syndrome  · Symptoms typically include a dull ache in the scrotum and the perineum.  DIAGNOSIS   In order to diagnose prostatitis, your caregiver will ask about your symptoms. If acute or chronic bacterial prostatitis is suspected, a urine sample will be taken and tested (urinalysis). This is to see if there is bacteria in your urine. If the urinalysis result is negative for bacteria, your caregiver may use a finger to feel your prostate (digital rectal exam). This exam helps your caregiver determine if your prostate is swollen and tender.  TREATMENT   Treatment for prostatitis depends on the cause. If a bacterial infection is the cause, it can be treated with antibiotic medicine. In cases of chronic bacterial prostatitis, the use of antibiotics for up to 1 month may be necessary. Your caregiver may instruct you to take sitz baths to help relieve pain. A sitz bath is a bath of hot water in which your hips and buttocks are under water.  HOME CARE INSTRUCTIONS   · Take all medicines as directed by your caregiver.  · Take sitz baths as directed by your caregiver.  SEEK MEDICAL CARE   IF:   · Your symptoms get worse, not better.  · You have a fever.  SEEK IMMEDIATE MEDICAL CARE IF:   · You have chills.  · You feel nauseous or vomit.  · You feel lightheaded or faint.  · You are unable to urinate.  · You have blood or blood clots in your urine.  Document Released: 02/23/2000 Document Revised: 05/20/2011 Document Reviewed: 01/28/2011  [employer]® Patient Information ©2014 [employer].

## 2012-10-21 NOTE — Assessment & Plan Note (Signed)
His xray shows stool throughout the colon, will check his labs today to look for secondary causes and have asked him to start linzess

## 2012-10-21 NOTE — Telephone Encounter (Signed)
Try the samples and let me know how it works

## 2012-10-21 NOTE — Assessment & Plan Note (Signed)
Xray shows stool The exam is not acute Will check his labs to look for renal stones, hematuria, infection, pancreatitis, hepatitis, renal failure

## 2012-10-21 NOTE — Telephone Encounter (Signed)
Patient wife called [facility]  To advise MD that pt cannot afford to purchase rapaflo due to expensive cost. He would like other recommendation for MD. Thanks

## 2012-10-21 NOTE — Assessment & Plan Note (Signed)
flomax has not helped him much so I have asked him to change to rapaflo

## 2012-10-22 ENCOUNTER — Encounter: Payer: Self-pay | Admitting: Internal Medicine

## 2012-10-22 ENCOUNTER — Ambulatory Visit: Payer: Medicare Other | Admitting: Internal Medicine

## 2012-10-22 NOTE — Telephone Encounter (Signed)
Returned call to pt//[facility] advising per MD

## 2012-10-27 ENCOUNTER — Ambulatory Visit (INDEPENDENT_AMBULATORY_CARE_PROVIDER_SITE_OTHER): Payer: Medicare Other | Admitting: *Deleted

## 2012-10-27 DIAGNOSIS — I4891 Unspecified atrial fibrillation: Secondary | ICD-10-CM | POA: Diagnosis not present

## 2012-10-27 MED ORDER — WARFARIN SODIUM 5 MG PO TABS: ORAL_TABLET | ORAL | Status: DC

## 2012-11-12 ENCOUNTER — Ambulatory Visit (INDEPENDENT_AMBULATORY_CARE_PROVIDER_SITE_OTHER): Payer: Medicare Other | Admitting: Pharmacist

## 2012-11-12 DIAGNOSIS — I4891 Unspecified atrial fibrillation: Secondary | ICD-10-CM | POA: Diagnosis not present

## 2012-11-16 ENCOUNTER — Other Ambulatory Visit: Payer: Self-pay | Admitting: Cardiology

## 2012-11-23 ENCOUNTER — Other Ambulatory Visit: Payer: Self-pay | Admitting: *Deleted

## 2012-11-23 DIAGNOSIS — N402 Nodular prostate without lower urinary tract symptoms: Secondary | ICD-10-CM

## 2012-11-23 MED ORDER — SILODOSIN 8 MG PO CAPS: 8.0000 mg | ORAL_CAPSULE | Freq: Every day | ORAL | Status: DC

## 2012-12-15 ENCOUNTER — Ambulatory Visit (INDEPENDENT_AMBULATORY_CARE_PROVIDER_SITE_OTHER): Payer: Medicare Other | Admitting: General Practice

## 2012-12-15 DIAGNOSIS — I4891 Unspecified atrial fibrillation: Secondary | ICD-10-CM

## 2012-12-17 ENCOUNTER — Other Ambulatory Visit: Payer: Self-pay | Admitting: Cardiology

## 2012-12-25 ENCOUNTER — Telehealth: Payer: Self-pay | Admitting: Cardiology

## 2012-12-25 ENCOUNTER — Other Ambulatory Visit: Payer: Self-pay | Admitting: *Deleted

## 2012-12-25 ENCOUNTER — Encounter: Payer: Self-pay | Admitting: Cardiology

## 2012-12-25 ENCOUNTER — Ambulatory Visit (INDEPENDENT_AMBULATORY_CARE_PROVIDER_SITE_OTHER): Payer: Medicare Other | Admitting: Cardiology

## 2012-12-25 VITALS — BP 132/84 | HR 81 | Ht 65.0 in | Wt 145.0 lb

## 2012-12-25 DIAGNOSIS — I4891 Unspecified atrial fibrillation: Secondary | ICD-10-CM

## 2012-12-25 DIAGNOSIS — R0989 Other specified symptoms and signs involving the circulatory and respiratory systems: Secondary | ICD-10-CM

## 2012-12-25 DIAGNOSIS — I219 Acute myocardial infarction, unspecified: Secondary | ICD-10-CM

## 2012-12-25 DIAGNOSIS — I679 Cerebrovascular disease, unspecified: Secondary | ICD-10-CM | POA: Diagnosis not present

## 2012-12-25 DIAGNOSIS — I10 Essential (primary) hypertension: Secondary | ICD-10-CM | POA: Diagnosis not present

## 2012-12-25 DIAGNOSIS — E7800 Pure hypercholesterolemia, unspecified: Secondary | ICD-10-CM

## 2012-12-25 MED ORDER — TAMSULOSIN HCL 0.4 MG PO CAPS: 0.4000 mg | ORAL_CAPSULE | Freq: Every day | ORAL | Status: DC

## 2012-12-25 NOTE — Progress Notes (Signed)
HPI: [name] is a gentleman who has a history of an acute anterior myocardial infarction occurring in the setting of atrial fibrillation, felt secondary to an embolic event. His LV function is preserved. We have been treating with rate control and anticoagulation at his request. His previous cardiac catheterization in August of 2009 revealed an occluded LAD but no other obstructive disease. The LAD occlusion was felt secondary to an embolus and resolved with thrombus aspiration. Echocardiogram in August of 2009 showed hypokinesis of the distal inferior septum and apical inferior wall but normal LV function. Carotid Dopplers in July 2013 showed 0-39% bilateral stenosis and followup was recommended in 2 years. I last saw him in June of 2013. Since then the patient denies any dyspnea on exertion, orthopnea, PND, pedal edema, palpitations, syncope or exertional chest pain.    Current Outpatient Prescriptions  Medication Sig Dispense Refill  . Ascorbic Acid (VITAMIN C) 500 MG tablet Take 500 mg by mouth daily.        . Calcium Carbonate-Vitamin D (CALCIUM-VITAMIN D) 600-200 MG-UNIT CAPS Take 1 tablet by mouth 2 (two) times daily.       Marland Kitchen diltiazem (CARDIZEM CD) 300 MG 24 hr capsule TAKE ONE CAPSULE BY MOUTH ONCE DAILY ( NEEDS TO CALL OUR OFFICE TO SCHEDULE A FOLLOW UP APPOINTMENT)  30 capsule  0  . Garlic Oil (SUPER GARLIC) 1000 MG CAPS Take 2 capsules by mouth daily.        Marland Kitchen losartan (COZAAR) 50 MG tablet Take 1 tablet (50 mg total) by mouth daily.  90 tablet  3  . Magnesium 300 MG CAPS Take 1 capsule by mouth daily.        . Multiple Vitamin (MULTIVITAMIN) tablet Take 1 tablet by mouth daily.        . vitamin E 400 UNIT capsule Take 400 Units by mouth daily.        Marland Kitchen warfarin (COUMADIN) 5 MG tablet Take as directed by coumadin clinic  60 tablet  1  . [DISCONTINUED] lovastatin (MEVACOR) 20 MG tablet Take 1 tablet (20 mg total) by mouth daily. At bedtime  30 tablet  11   No current  facility-administered medications for this visit.     Past Medical History  Diagnosis Date  . Atrial fibrillation   . Hyperlipidemia   . Anterior myocardial infarction     Presumed secondary to embolus from atrial fibrillation  . Hypertension   . Arthritis   . Hard of hearing   . Hemorrhoid   . Hx of adenomatous colonic polyps 07/2008    Past Surgical History  Procedure Laterality Date  . Stapedes surgery      History   Social History  . Marital Status: Married    Spouse Name: N/A    Number of Children: 6  . Years of Education: N/A   Occupational History  . Retired   .     Social History Main Topics  . Smoking status: Former Games developer  . Smokeless tobacco: Never Used  . Alcohol Use: No     Comment: occasional  . Drug Use: No  . Sexual Activity: No   Other Topics Concern  . Not on file   Social History Narrative  . No narrative on file    ROS: no fevers or chills, productive cough, hemoptysis, dysphasia, odynophagia, melena, hematochezia, dysuria, hematuria, rash, seizure activity, orthopnea, PND, pedal edema, claudication. Remaining systems are negative.  Physical Exam: Well-developed well-nourished in no  acute distress.  Skin is warm and dry.  HEENT is normal.  Neck is supple.  Chest is clear to auscultation with normal expansion.  Cardiovascular exam is irregular Abdominal exam nontender or distended. No masses palpated. Positive bruit Extremities show no edema. neuro grossly intact  ECG atrial fibrillation with no ST changes.

## 2012-12-25 NOTE — Assessment & Plan Note (Signed)
followup carotid Dopplers July 2015. 

## 2012-12-25 NOTE — Assessment & Plan Note (Signed)
Continue Cardizem for rate control.continue Coumadin. Repeat echo.

## 2012-12-25 NOTE — Assessment & Plan Note (Signed)
Blood pressure controlled. Continue present medications. 

## 2012-12-25 NOTE — Assessment & Plan Note (Signed)
Continue diet. Intolerant to statins. 

## 2012-12-25 NOTE — Telephone Encounter (Signed)
Pt called to let Dr Jens Som know the medication that was not on the pt's list of med taking. pt is taking; Tamsulosin 0.4 mg once a day med. was added to pt's list.

## 2012-12-25 NOTE — Patient Instructions (Signed)
Your physician has requested that you have an echocardiogram. Echocardiography is a painless test that uses sound waves to create images of your heart. It provides your doctor with information about the size and shape of your heart and how well your heart's chambers and valves are working. This procedure takes approximately one hour. There are no restrictions for this procedure.  Your physician wants you to follow-up in: 1 YEAR WITH DR. CRENSHAW You will receive a reminder letter in the mail two months in advance. If you don't receive a letter, please call our office to schedule the follow-up appointment.  Your physician recommends that you continue on your current medications as directed. Please refer to the Current Medication list given to you today.

## 2012-12-25 NOTE — Assessment & Plan Note (Signed)
Plan schedule ultrasound to exclude aneurysm.

## 2012-12-25 NOTE — Telephone Encounter (Signed)
New message    Dr Jens Som want pt to call back and give name of rx-----tamsulosin 0.4mg 

## 2012-12-31 ENCOUNTER — Ambulatory Visit (HOSPITAL_COMMUNITY): Payer: Medicare Other | Attending: Cardiology

## 2012-12-31 DIAGNOSIS — R0989 Other specified symptoms and signs involving the circulatory and respiratory systems: Secondary | ICD-10-CM | POA: Insufficient documentation

## 2012-12-31 DIAGNOSIS — Z87891 Personal history of nicotine dependence: Secondary | ICD-10-CM | POA: Diagnosis not present

## 2012-12-31 DIAGNOSIS — E785 Hyperlipidemia, unspecified: Secondary | ICD-10-CM | POA: Insufficient documentation

## 2012-12-31 DIAGNOSIS — I708 Atherosclerosis of other arteries: Secondary | ICD-10-CM | POA: Insufficient documentation

## 2012-12-31 DIAGNOSIS — I2510 Atherosclerotic heart disease of native coronary artery without angina pectoris: Secondary | ICD-10-CM | POA: Diagnosis not present

## 2012-12-31 DIAGNOSIS — I700 Atherosclerosis of aorta: Secondary | ICD-10-CM | POA: Insufficient documentation

## 2012-12-31 DIAGNOSIS — I10 Essential (primary) hypertension: Secondary | ICD-10-CM | POA: Insufficient documentation

## 2013-01-11 ENCOUNTER — Ambulatory Visit (HOSPITAL_COMMUNITY): Payer: Medicare Other | Attending: Cardiology | Admitting: Radiology

## 2013-01-11 DIAGNOSIS — I4891 Unspecified atrial fibrillation: Secondary | ICD-10-CM | POA: Diagnosis not present

## 2013-01-11 DIAGNOSIS — R0989 Other specified symptoms and signs involving the circulatory and respiratory systems: Secondary | ICD-10-CM | POA: Diagnosis not present

## 2013-01-11 DIAGNOSIS — E785 Hyperlipidemia, unspecified: Secondary | ICD-10-CM | POA: Diagnosis not present

## 2013-01-11 DIAGNOSIS — I10 Essential (primary) hypertension: Secondary | ICD-10-CM | POA: Diagnosis not present

## 2013-01-11 DIAGNOSIS — E039 Hypothyroidism, unspecified: Secondary | ICD-10-CM | POA: Insufficient documentation

## 2013-01-11 DIAGNOSIS — I679 Cerebrovascular disease, unspecified: Secondary | ICD-10-CM | POA: Insufficient documentation

## 2013-01-11 DIAGNOSIS — I252 Old myocardial infarction: Secondary | ICD-10-CM | POA: Insufficient documentation

## 2013-01-11 NOTE — Progress Notes (Signed)
Echocardiogram performed.  

## 2013-01-14 ENCOUNTER — Other Ambulatory Visit: Payer: Self-pay

## 2013-01-15 ENCOUNTER — Other Ambulatory Visit: Payer: Self-pay | Admitting: Cardiology

## 2013-01-20 DIAGNOSIS — H903 Sensorineural hearing loss, bilateral: Secondary | ICD-10-CM | POA: Diagnosis not present

## 2013-01-26 ENCOUNTER — Ambulatory Visit (INDEPENDENT_AMBULATORY_CARE_PROVIDER_SITE_OTHER): Payer: Medicare Other | Admitting: *Deleted

## 2013-01-26 DIAGNOSIS — I4891 Unspecified atrial fibrillation: Secondary | ICD-10-CM | POA: Diagnosis not present

## 2013-01-27 DIAGNOSIS — H903 Sensorineural hearing loss, bilateral: Secondary | ICD-10-CM | POA: Diagnosis not present

## 2013-03-08 DIAGNOSIS — H903 Sensorineural hearing loss, bilateral: Secondary | ICD-10-CM | POA: Diagnosis not present

## 2013-03-09 ENCOUNTER — Ambulatory Visit (INDEPENDENT_AMBULATORY_CARE_PROVIDER_SITE_OTHER): Payer: Medicare Other | Admitting: Pharmacist

## 2013-03-09 DIAGNOSIS — I4891 Unspecified atrial fibrillation: Secondary | ICD-10-CM | POA: Diagnosis not present

## 2013-03-09 LAB — POCT INR: INR: 3

## 2013-03-17 ENCOUNTER — Other Ambulatory Visit: Payer: Self-pay | Admitting: *Deleted

## 2013-03-17 DIAGNOSIS — I10 Essential (primary) hypertension: Secondary | ICD-10-CM

## 2013-03-17 MED ORDER — LOSARTAN POTASSIUM 50 MG PO TABS: 50.0000 mg | ORAL_TABLET | Freq: Every day | ORAL | Status: DC

## 2013-03-18 ENCOUNTER — Other Ambulatory Visit: Payer: Self-pay

## 2013-03-18 DIAGNOSIS — I10 Essential (primary) hypertension: Secondary | ICD-10-CM

## 2013-03-18 MED ORDER — LOSARTAN POTASSIUM 50 MG PO TABS: 50.0000 mg | ORAL_TABLET | Freq: Every day | ORAL | Status: DC

## 2013-04-20 ENCOUNTER — Ambulatory Visit (INDEPENDENT_AMBULATORY_CARE_PROVIDER_SITE_OTHER): Payer: Medicare Other | Admitting: Pharmacist

## 2013-04-20 DIAGNOSIS — I4891 Unspecified atrial fibrillation: Secondary | ICD-10-CM | POA: Diagnosis not present

## 2013-04-20 DIAGNOSIS — Z5181 Encounter for therapeutic drug level monitoring: Secondary | ICD-10-CM | POA: Diagnosis not present

## 2013-04-20 LAB — POCT INR: INR: 2.6

## 2013-05-28 ENCOUNTER — Other Ambulatory Visit: Payer: Self-pay | Admitting: Cardiology

## 2013-06-01 ENCOUNTER — Ambulatory Visit (INDEPENDENT_AMBULATORY_CARE_PROVIDER_SITE_OTHER): Payer: Medicare Other

## 2013-06-01 DIAGNOSIS — I4891 Unspecified atrial fibrillation: Secondary | ICD-10-CM | POA: Diagnosis not present

## 2013-06-01 DIAGNOSIS — Z5181 Encounter for therapeutic drug level monitoring: Secondary | ICD-10-CM

## 2013-06-01 LAB — POCT INR: INR: 3

## 2013-06-01 MED ORDER — WARFARIN SODIUM 5 MG PO TABS: ORAL_TABLET | ORAL | Status: DC

## 2013-07-13 ENCOUNTER — Ambulatory Visit (INDEPENDENT_AMBULATORY_CARE_PROVIDER_SITE_OTHER): Payer: Medicare Other | Admitting: Pharmacist Clinician (PhC)/ Clinical Pharmacy Specialist

## 2013-07-13 DIAGNOSIS — Z5181 Encounter for therapeutic drug level monitoring: Secondary | ICD-10-CM

## 2013-07-13 DIAGNOSIS — I4891 Unspecified atrial fibrillation: Secondary | ICD-10-CM

## 2013-07-13 LAB — POCT INR: INR: 2.6

## 2013-07-15 DIAGNOSIS — H25019 Cortical age-related cataract, unspecified eye: Secondary | ICD-10-CM | POA: Diagnosis not present

## 2013-07-15 DIAGNOSIS — H4011X0 Primary open-angle glaucoma, stage unspecified: Secondary | ICD-10-CM | POA: Diagnosis not present

## 2013-08-01 DIAGNOSIS — H903 Sensorineural hearing loss, bilateral: Secondary | ICD-10-CM | POA: Diagnosis not present

## 2013-08-18 ENCOUNTER — Encounter: Payer: Self-pay | Admitting: Internal Medicine

## 2013-08-18 ENCOUNTER — Ambulatory Visit (INDEPENDENT_AMBULATORY_CARE_PROVIDER_SITE_OTHER): Payer: Medicare Other | Admitting: Internal Medicine

## 2013-08-18 ENCOUNTER — Other Ambulatory Visit (INDEPENDENT_AMBULATORY_CARE_PROVIDER_SITE_OTHER): Payer: Medicare Other

## 2013-08-18 VITALS — BP 128/88 | HR 77 | Temp 98.5°F | Resp 16 | Ht 65.0 in | Wt 152.0 lb

## 2013-08-18 DIAGNOSIS — E039 Hypothyroidism, unspecified: Secondary | ICD-10-CM

## 2013-08-18 DIAGNOSIS — N402 Nodular prostate without lower urinary tract symptoms: Secondary | ICD-10-CM

## 2013-08-18 DIAGNOSIS — I10 Essential (primary) hypertension: Secondary | ICD-10-CM

## 2013-08-18 DIAGNOSIS — N4889 Other specified disorders of penis: Secondary | ICD-10-CM

## 2013-08-18 DIAGNOSIS — Z0000 Encounter for general adult medical examination without abnormal findings: Secondary | ICD-10-CM | POA: Diagnosis not present

## 2013-08-18 DIAGNOSIS — E7800 Pure hypercholesterolemia, unspecified: Secondary | ICD-10-CM | POA: Diagnosis not present

## 2013-08-18 DIAGNOSIS — I219 Acute myocardial infarction, unspecified: Secondary | ICD-10-CM

## 2013-08-18 DIAGNOSIS — R7309 Other abnormal glucose: Secondary | ICD-10-CM | POA: Diagnosis not present

## 2013-08-18 DIAGNOSIS — E559 Vitamin D deficiency, unspecified: Secondary | ICD-10-CM

## 2013-08-18 DIAGNOSIS — I4891 Unspecified atrial fibrillation: Secondary | ICD-10-CM

## 2013-08-18 DIAGNOSIS — H9190 Unspecified hearing loss, unspecified ear: Secondary | ICD-10-CM

## 2013-08-18 LAB — COMPREHENSIVE METABOLIC PANEL
ALT: 15 U/L (ref 0–53)
AST: 17 U/L (ref 0–37)
Albumin: 4.3 g/dL (ref 3.5–5.2)
Alkaline Phosphatase: 58 U/L (ref 39–117)
BILIRUBIN TOTAL: 0.8 mg/dL (ref 0.2–1.2)
BUN: 14 mg/dL (ref 6–23)
CO2: 28 meq/L (ref 19–32)
Calcium: 9.9 mg/dL (ref 8.4–10.5)
Chloride: 104 mEq/L (ref 96–112)
Creatinine, Ser: 1.1 mg/dL (ref 0.4–1.5)
GFR: 84.37 mL/min (ref >60.00)
GLUCOSE: 93 mg/dL (ref 70–99)
Potassium: 4.2 mEq/L (ref 3.5–5.1)
Sodium: 139 mEq/L (ref 135–145)
Total Protein: 6.8 g/dL (ref 6.0–8.3)

## 2013-08-18 LAB — URINALYSIS, ROUTINE W REFLEX MICROSCOPIC
BILIRUBIN URINE: NEGATIVE
HGB URINE DIPSTICK: NEGATIVE
KETONES UR: NEGATIVE
LEUKOCYTES UA: NEGATIVE
Nitrite: NEGATIVE
PH: 8 (ref 5.0–8.0)
Specific Gravity, Urine: 1.015 (ref 1.000–1.030)
Total Protein, Urine: NEGATIVE
UROBILINOGEN UA: 0.2 (ref 0.0–1.0)
Urine Glucose: NEGATIVE

## 2013-08-18 LAB — CBC WITH DIFFERENTIAL/PLATELET
BASOS ABS: 0 10*3/uL (ref 0.0–0.1)
Basophils Relative: 0.4 % (ref 0.0–3.0)
Eosinophils Absolute: 0.1 10*3/uL (ref 0.0–0.7)
Eosinophils Relative: 2 % (ref 0.0–5.0)
HEMATOCRIT: 44.4 % (ref 39.0–52.0)
HEMOGLOBIN: 15 g/dL (ref 13.0–17.0)
LYMPHS ABS: 1.8 10*3/uL (ref 0.7–4.0)
Lymphocytes Relative: 24.5 % (ref 12.0–46.0)
MCHC: 33.8 g/dL (ref 30.0–36.0)
MCV: 96.1 fl (ref 78.0–100.0)
MONOS PCT: 10.6 % (ref 3.0–12.0)
Monocytes Absolute: 0.8 10*3/uL (ref 0.1–1.0)
Neutro Abs: 4.6 10*3/uL (ref 1.4–7.7)
Neutrophils Relative %: 62.5 % (ref 43.0–77.0)
Platelets: 165 10*3/uL (ref 150.0–400.0)
RBC: 4.62 Mil/uL (ref 4.22–5.81)
RDW: 12.4 % (ref 11.5–15.5)
WBC: 7.4 10*3/uL (ref 4.0–10.5)

## 2013-08-18 LAB — PSA: PSA: 2.08 ng/mL (ref 0.10–4.00)

## 2013-08-18 LAB — LIPID PANEL
Cholesterol: 193 mg/dL (ref 0–200)
HDL: 39.5 mg/dL (ref >39.00)
LDL CALC: 126 mg/dL — AB (ref 0–99)
NONHDL: 153.5
Total CHOL/HDL Ratio: 5
Triglycerides: 136 mg/dL (ref 0.0–149.0)
VLDL: 27.2 mg/dL (ref 0.0–40.0)

## 2013-08-18 LAB — HEMOGLOBIN A1C: HEMOGLOBIN A1C: 4.8 % (ref 4.6–6.5)

## 2013-08-18 LAB — TSH: TSH: 3.73 u[IU]/mL (ref 0.35–4.50)

## 2013-08-18 LAB — FECAL OCCULT BLOOD, GUAIAC: Fecal Occult Blood: NEGATIVE

## 2013-08-18 NOTE — Assessment & Plan Note (Addendum)

## 2013-08-18 NOTE — Assessment & Plan Note (Signed)
I will check his A1C to see if he has developed DM2 

## 2013-08-18 NOTE — Assessment & Plan Note (Signed)
He will not take a statin 

## 2013-08-18 NOTE — Patient Instructions (Signed)
Health Maintenance, Males A healthy lifestyle and preventative care can promote health and wellness.  Maintain regular health, dental, and eye exams.  Eat a healthy diet. Foods like vegetables, fruits, whole grains, low-fat dairy products, and lean protein foods contain the nutrients you need and are low in calories. Decrease your intake of foods high in solid fats, added sugars, and salt. Get information about a proper diet from your health care provider, if necessary.  Regular physical exercise is one of the most important things you can do for your health. Most adults should get at least 150 minutes of moderate-intensity exercise (any activity that increases your heart rate and causes you to sweat) each week. In addition, most adults need muscle-strengthening exercises on 2 or more days a week.   Maintain a healthy weight. The body mass index (BMI) is a screening tool to identify possible weight problems. It provides an estimate of body fat based on height and weight. Your health care provider can find your BMI and can help you achieve or maintain a healthy weight. For males 20 years and older:  A BMI below 18.5 is considered underweight.  A BMI of 18.5 to 24.9 is normal.  A BMI of 25 to 29.9 is considered overweight.  A BMI of 30 and above is considered obese.  Maintain normal blood lipids and cholesterol by exercising and minimizing your intake of saturated fat. Eat a balanced diet with plenty of fruits and vegetables. Blood tests for lipids and cholesterol should begin at age 20 and be repeated every 5 years. If your lipid or cholesterol levels are high, you are over 50, or you are at high risk for heart disease, you may need your cholesterol levels checked more frequently.Ongoing high lipid and cholesterol levels should be treated with medicines, if diet and exercise are not working.  If you smoke, find out from your health care provider how to quit. If you do not use tobacco, do not  start.  Lung cancer screening is recommended for adults aged 55 80 years who are at high risk for developing lung cancer because of a history of smoking. A yearly low-dose CT scan of the lungs is recommended for people who have at least a 30-pack-year history of smoking and are a current smoker or have quit within the past 15 years. A pack year of smoking is smoking an average of 1 pack of cigarettes a day for 1 year (for example, a 30-pack-year history of smoking could mean smoking 1 pack a day for 30 years or 2 packs a day for 15 years). Yearly screening should continue until the smoker has stopped smoking for at least 15 years. Yearly screening should be stopped for people who develop a health problem that would prevent them from having lung cancer treatment.  If you choose to drink alcohol, do not have more than 2 drinks per day. One drink is considered to be 12 oz (360 mL) of beer, 5 oz (150 mL) of wine, or 1.5 oz (45 mL) of liquor.  Avoid use of street drugs. Do not share needles with anyone. Ask for help if you need support or instructions about stopping the use of drugs.  High blood pressure causes heart disease and increases the risk of stroke. Blood pressure should be checked at least every 1 2 years. Ongoing high blood pressure should be treated with medicines if weight loss and exercise are not effective.  If you are 45 69 years old, ask your health   care provider if you should take aspirin to prevent heart disease.  Diabetes screening involves taking a blood sample to check your fasting blood sugar level. This should be done once every 3 years after age 45, if you are at a normal weight and without risk factors for diabetes. Testing should be considered at a younger age or be carried out more frequently if you are overweight and have at least 1 risk factor for diabetes.  Colorectal cancer can be detected and often prevented. Most routine colorectal cancer screening begins at the age of 50  and continues through age 75. However, your health care provider may recommend screening at an earlier age if you have risk factors for colon cancer. On a yearly basis, your health care provider may provide home test kits to check for hidden blood in the stool. A small camera at the end of a tube may be used to directly examine the colon (sigmoidoscopy or colonoscopy) to detect the earliest forms of colorectal cancer. Talk to your health care provider about this at age 50, when routine screening begins. A direct exam of the colon should be repeated every 5 10 years through age 75, unless early forms of pre-cancerous polyps or small growths are found.  People who are at an increased risk for hepatitis B should be screened for this virus. You are considered at high risk for hepatitis B if:  You were born in a country where hepatitis B occurs often. Talk with your health care provider about which countries are considered high-risk.  Your parents were born in a high-risk country and you have not received a shot to protect against hepatitis B (hepatitis B vaccine).  You have HIV or AIDS.  You use needles to inject street drugs.  You live with, or have sex with, someone who has hepatitis B.  You are a man who has sex with other men (MSM).  You get hemodialysis treatment.  You take certain medicines for conditions like cancer, organ transplantation, and autoimmune conditions.  Hepatitis C blood testing is recommended for all people born from 1945 through 1965 and any individual with known risk factors for hepatitis C.  Healthy men should no longer receive prostate-specific antigen (PSA) blood tests as part of routine cancer screening. Talk to your health care provider about prostate cancer screening.  Testicular cancer screening is not recommended for adolescents or adult males who have no symptoms. Screening includes self-exam, a health care provider exam, and other screening tests. Consult with  your health care provider about any symptoms you have or any concerns you have about testicular cancer.  Practice safe sex. Use condoms and avoid high-risk sexual practices to reduce the spread of sexually transmitted infections (STIs).  Use sunscreen. Apply sunscreen liberally and repeatedly throughout the day. You should seek shade when your shadow is shorter than you. Protect yourself by wearing long sleeves, pants, a wide-brimmed hat, and sunglasses year round, whenever you are outdoors.  Tell your health care provider of new moles or changes in moles, especially if there is a change in shape or color. Also tell your provider if a mole is larger than the size of a pencil eraser.  A one-time screening for abdominal aortic aneurysm (AAA) and surgical repair of large AAAs by ultrasound is recommended for men aged 65 75 years who are current or former smokers.  Stay current with your vaccines (immunizations). Document Released: 08/24/2007 Document Revised: 12/16/2012 Document Reviewed: 07/23/2010 [employer] Patient Information 2014 [employer].   Hypertension As your heart beats, it forces blood through your arteries. This force is your blood pressure. If the pressure is too high, it is called hypertension (HTN) or high blood pressure. HTN is dangerous because you may have it and not know it. High blood pressure may mean that your heart has to work harder to pump blood. Your arteries may be narrow or stiff. The extra work puts you at risk for heart disease, stroke, and other problems.  Blood pressure consists of two numbers, a higher number over a lower, 110/72, for example. It is stated as "110 over 72." The ideal is below 120 for the top number (systolic) and under 80 for the bottom (diastolic). Write down your blood pressure today. You should pay close attention to your blood pressure if you have certain conditions such as:  Heart failure.  Prior heart attack.  Diabetes  Chronic kidney  disease.  Prior stroke.  Multiple risk factors for heart disease. To see if you have HTN, your blood pressure should be measured while you are seated with your arm held at the level of the heart. It should be measured at least twice. A one-time elevated blood pressure reading (especially in the Emergency Department) does not mean that you need treatment. There may be conditions in which the blood pressure is different between your right and left arms. It is important to see your caregiver soon for a recheck. Most people have essential hypertension which means that there is not a specific cause. This type of high blood pressure may be lowered by changing lifestyle factors such as:  Stress.  Smoking.  Lack of exercise.  Excessive weight.  Drug/tobacco/alcohol use.  Eating less salt. Most people do not have symptoms from high blood pressure until it has caused damage to the body. Effective treatment can often prevent, delay or reduce that damage. TREATMENT  When a cause has been identified, treatment for high blood pressure is directed at the cause. There are a large number of medications to treat HTN. These fall into several categories, and your caregiver will help you select the medicines that are best for you. Medications may have side effects. You should review side effects with your caregiver. If your blood pressure stays high after you have made lifestyle changes or started on medicines,   Your medication(s) may need to be changed.  Other problems may need to be addressed.  Be certain you understand your prescriptions, and know how and when to take your medicine.  Be sure to follow up with your caregiver within the time frame advised (usually within two weeks) to have your blood pressure rechecked and to review your medications.  If you are taking more than one medicine to lower your blood pressure, make sure you know how and at what times they should be taken. Taking two medicines  at the same time can result in blood pressure that is too low. SEEK IMMEDIATE MEDICAL CARE IF:  You develop a severe headache, blurred or changing vision, or confusion.  You have unusual weakness or numbness, or a faint feeling.  You have severe chest or abdominal pain, vomiting, or breathing problems. MAKE SURE YOU:   Understand these instructions.  Will watch your condition.  Will get help right away if you are not doing well or get worse. Document Released: 02/25/2005 Document Revised: 05/20/2011 Document Reviewed: 10/16/2007 [employer] Patient Information 2014 [employer].  

## 2013-08-18 NOTE — Progress Notes (Signed)
Subjective:    Patient ID: [name], male    DOB: 03-15-1945, 68 y.o.   MRN: [id number]  Hypertension This is a chronic problem. The current episode started more than 1 year ago. The problem is unchanged. The problem is controlled. Pertinent negatives include no anxiety, blurred vision, chest pain, headaches, malaise/fatigue, neck pain, orthopnea, palpitations, peripheral edema, PND, shortness of breath or sweats. Past treatments include calcium channel blockers and angiotensin blockers. The current treatment provides moderate improvement. There are no compliance problems.  Hypertensive end-organ damage includes heart failure and a thyroid problem.      Review of Systems  Constitutional: Negative.  Negative for fever, chills, malaise/fatigue, diaphoresis, appetite change and fatigue.  HENT: Negative.   Eyes: Negative.  Negative for blurred vision.  Respiratory: Negative.  Negative for apnea, cough, choking, chest tightness, shortness of breath, wheezing and stridor.   Cardiovascular: Negative.  Negative for chest pain, palpitations, orthopnea, leg swelling and PND.  Gastrointestinal: Negative.  Negative for nausea, vomiting, abdominal pain, diarrhea, constipation and blood in stool.  Endocrine: Negative.   Genitourinary: Negative.   Musculoskeletal: Negative.  Negative for arthralgias, back pain, joint swelling and neck pain.  Skin: Negative.  Negative for rash.  Allergic/Immunologic: Negative.   Neurological: Negative.  Negative for dizziness, tremors, seizures, syncope, facial asymmetry, speech difficulty, weakness, light-headedness, numbness and headaches.  Hematological: Negative.  Negative for adenopathy. Does not bruise/bleed easily.  Psychiatric/Behavioral: Negative.        Objective:   Physical Exam  Vitals reviewed. Constitutional: He is oriented to person, place, and time. He appears well-developed and well-nourished. No distress.  HENT:  Head: Normocephalic and  atraumatic.  Mouth/Throat: Oropharynx is clear and moist. No oropharyngeal exudate.  Eyes: Conjunctivae are normal. Right eye exhibits no discharge. Left eye exhibits no discharge. No scleral icterus.  Neck: Normal range of motion. Neck supple. No JVD present. No tracheal deviation present. No thyromegaly present.  Cardiovascular: Normal rate, S1 normal, S2 normal, normal heart sounds and intact distal pulses.  An irregularly irregular rhythm present. Exam reveals no gallop and no friction rub.   No murmur heard. Pulses:      Carotid pulses are 1+ on the right side, and 1+ on the left side.      Radial pulses are 1+ on the right side, and 1+ on the left side.       Femoral pulses are 1+ on the right side, and 1+ on the left side.      Popliteal pulses are 1+ on the right side, and 1+ on the left side.       Dorsalis pedis pulses are 1+ on the right side, and 1+ on the left side.       Posterior tibial pulses are 1+ on the right side, and 1+ on the left side.  Pulmonary/Chest: Effort normal and breath sounds normal. No stridor. No respiratory distress. He has no wheezes. He has no rales. He exhibits no tenderness.  Abdominal: Soft. Bowel sounds are normal. He exhibits no distension and no mass. There is no tenderness. There is no rebound and no guarding. Hernia confirmed negative in the right inguinal area and confirmed negative in the left inguinal area.  Genitourinary: Rectum normal, testes normal and penis normal. Rectal exam shows no external hemorrhoid, no internal hemorrhoid, no fissure, no mass, no tenderness and anal tone normal. Guaiac negative stool. Prostate is enlarged (1+ smooth symm BPH). Prostate is not tender. Right testis shows no mass,  no swelling and no tenderness. Right testis is descended. Left testis shows no mass, no swelling and no tenderness. Left testis is descended. Uncircumcised. No phimosis, paraphimosis, hypospadias, penile erythema or penile tenderness. No discharge  found.  Musculoskeletal: Normal range of motion. He exhibits no edema and no tenderness.  Lymphadenopathy:    He has no cervical adenopathy.       Right: No inguinal adenopathy present.       Left: No inguinal adenopathy present.  Neurological: He is oriented to person, place, and time.  Skin: Skin is warm and dry. No rash noted. He is not diaphoretic. No erythema. No pallor.  Psychiatric: He has a normal mood and affect. His behavior is normal. Judgment and thought content normal.     Lab Results  Component Value Date   WBC 8.1 10/21/2012   HGB 15.5 10/21/2012   HCT 45.6 10/21/2012   PLT 165.0 10/21/2012   GLUCOSE 115* 10/21/2012   CHOL 166 06/05/2011   TRIG 153.0* 06/05/2011   HDL 48.60 06/05/2011   LDLDIRECT 106.4 08/02/2010   LDLCALC 87 06/05/2011   ALT 19 10/21/2012   AST 26 10/21/2012   NA 138 10/21/2012   K 4.0 10/21/2012   CL 104 10/21/2012   CREATININE 1.3 10/21/2012   BUN 18 10/21/2012   CO2 29 10/21/2012   TSH 3.17 10/21/2012   PSA 2.18 04/20/2012   INR 2.6 07/13/2013   HGBA1C 4.9 06/05/2011       Assessment & Plan:

## 2013-08-18 NOTE — Assessment & Plan Note (Signed)
His EKG shows A fib with good rate control

## 2013-08-18 NOTE — Assessment & Plan Note (Signed)
His TSH is in the normal range

## 2013-08-18 NOTE — Assessment & Plan Note (Signed)
His BP is well controlled His lytes and renal function are stable 

## 2013-08-24 ENCOUNTER — Ambulatory Visit (INDEPENDENT_AMBULATORY_CARE_PROVIDER_SITE_OTHER): Payer: Medicare Other | Admitting: Pharmacist

## 2013-08-24 DIAGNOSIS — Z5181 Encounter for therapeutic drug level monitoring: Secondary | ICD-10-CM | POA: Diagnosis not present

## 2013-08-24 DIAGNOSIS — I4891 Unspecified atrial fibrillation: Secondary | ICD-10-CM | POA: Diagnosis not present

## 2013-08-24 LAB — POCT INR: INR: 3.1

## 2013-08-26 ENCOUNTER — Other Ambulatory Visit: Payer: Self-pay | Admitting: *Deleted

## 2013-08-26 MED ORDER — TAMSULOSIN HCL 0.4 MG PO CAPS: 0.4000 mg | ORAL_CAPSULE | Freq: Every day | ORAL | Status: DC

## 2013-09-11 ENCOUNTER — Other Ambulatory Visit: Payer: Self-pay | Admitting: Cardiology

## 2013-09-11 ENCOUNTER — Encounter: Payer: Self-pay | Admitting: Cardiology

## 2013-09-11 DIAGNOSIS — I10 Essential (primary) hypertension: Secondary | ICD-10-CM

## 2013-09-13 MED ORDER — LOSARTAN POTASSIUM 50 MG PO TABS: 50.0000 mg | ORAL_TABLET | Freq: Every day | ORAL | Status: DC

## 2013-10-05 ENCOUNTER — Ambulatory Visit (INDEPENDENT_AMBULATORY_CARE_PROVIDER_SITE_OTHER): Payer: Medicare Other | Admitting: *Deleted

## 2013-10-05 DIAGNOSIS — Z5181 Encounter for therapeutic drug level monitoring: Secondary | ICD-10-CM

## 2013-10-05 DIAGNOSIS — I4891 Unspecified atrial fibrillation: Secondary | ICD-10-CM

## 2013-10-05 LAB — POCT INR: INR: 4

## 2013-10-05 MED ORDER — WARFARIN SODIUM 5 MG PO TABS: ORAL_TABLET | ORAL | Status: DC

## 2013-10-15 DIAGNOSIS — H4011X0 Primary open-angle glaucoma, stage unspecified: Secondary | ICD-10-CM | POA: Diagnosis not present

## 2013-10-15 DIAGNOSIS — H409 Unspecified glaucoma: Secondary | ICD-10-CM | POA: Diagnosis not present

## 2013-10-22 ENCOUNTER — Ambulatory Visit (INDEPENDENT_AMBULATORY_CARE_PROVIDER_SITE_OTHER): Payer: Medicare Other | Admitting: Pharmacist

## 2013-10-22 DIAGNOSIS — I4891 Unspecified atrial fibrillation: Secondary | ICD-10-CM

## 2013-10-22 DIAGNOSIS — Z5181 Encounter for therapeutic drug level monitoring: Secondary | ICD-10-CM

## 2013-10-22 LAB — POCT INR: INR: 3.5

## 2013-11-09 ENCOUNTER — Ambulatory Visit (INDEPENDENT_AMBULATORY_CARE_PROVIDER_SITE_OTHER): Payer: Medicare Other | Admitting: *Deleted

## 2013-11-09 DIAGNOSIS — Z5181 Encounter for therapeutic drug level monitoring: Secondary | ICD-10-CM | POA: Diagnosis not present

## 2013-11-09 DIAGNOSIS — I4891 Unspecified atrial fibrillation: Secondary | ICD-10-CM

## 2013-11-09 LAB — POCT INR: INR: 2.5

## 2013-11-30 ENCOUNTER — Ambulatory Visit (INDEPENDENT_AMBULATORY_CARE_PROVIDER_SITE_OTHER): Payer: Medicare Other | Admitting: *Deleted

## 2013-11-30 DIAGNOSIS — Z5181 Encounter for therapeutic drug level monitoring: Secondary | ICD-10-CM

## 2013-11-30 DIAGNOSIS — I4891 Unspecified atrial fibrillation: Secondary | ICD-10-CM

## 2013-11-30 LAB — POCT INR: INR: 3.1

## 2013-12-09 ENCOUNTER — Other Ambulatory Visit: Payer: Self-pay | Admitting: Cardiology

## 2013-12-15 DIAGNOSIS — H2511 Age-related nuclear cataract, right eye: Secondary | ICD-10-CM | POA: Diagnosis not present

## 2013-12-15 DIAGNOSIS — H25011 Cortical age-related cataract, right eye: Secondary | ICD-10-CM | POA: Diagnosis not present

## 2013-12-15 DIAGNOSIS — H4033X0 Glaucoma secondary to eye trauma, bilateral, stage unspecified: Secondary | ICD-10-CM | POA: Diagnosis not present

## 2013-12-15 DIAGNOSIS — H25041 Posterior subcapsular polar age-related cataract, right eye: Secondary | ICD-10-CM | POA: Diagnosis not present

## 2013-12-28 ENCOUNTER — Ambulatory Visit (INDEPENDENT_AMBULATORY_CARE_PROVIDER_SITE_OTHER): Payer: Medicare Other | Admitting: *Deleted

## 2013-12-28 DIAGNOSIS — I4891 Unspecified atrial fibrillation: Secondary | ICD-10-CM

## 2013-12-28 DIAGNOSIS — Z5181 Encounter for therapeutic drug level monitoring: Secondary | ICD-10-CM

## 2013-12-28 LAB — POCT INR: INR: 2.9

## 2014-01-11 ENCOUNTER — Encounter: Payer: Self-pay | Admitting: *Deleted

## 2014-01-11 ENCOUNTER — Ambulatory Visit (INDEPENDENT_AMBULATORY_CARE_PROVIDER_SITE_OTHER): Payer: Medicare Other | Admitting: Cardiology

## 2014-01-11 ENCOUNTER — Encounter: Payer: Self-pay | Admitting: Cardiology

## 2014-01-11 VITALS — BP 130/80 | HR 74 | Ht 65.0 in | Wt 154.0 lb

## 2014-01-11 DIAGNOSIS — I482 Chronic atrial fibrillation, unspecified: Secondary | ICD-10-CM

## 2014-01-11 DIAGNOSIS — I679 Cerebrovascular disease, unspecified: Secondary | ICD-10-CM | POA: Diagnosis not present

## 2014-01-11 DIAGNOSIS — E7800 Pure hypercholesterolemia, unspecified: Secondary | ICD-10-CM

## 2014-01-11 DIAGNOSIS — I10 Essential (primary) hypertension: Secondary | ICD-10-CM | POA: Diagnosis not present

## 2014-01-11 NOTE — Assessment & Plan Note (Signed)
Intolerant to statins. Continue diet. 

## 2014-01-11 NOTE — Patient Instructions (Signed)

## 2014-01-11 NOTE — Assessment & Plan Note (Signed)
Patient remains in permanent atrial fibrillation. He remains asymptomatic. Continue rate control with Cardizem. Continue Coumadin which is monitored in the Coumadin clinic. 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

## 2014-01-11 NOTE — Progress Notes (Signed)
HPI: FU Atrial fibrillation; history of an acute anterior myocardial infarction occurring in the setting of atrial fibrillation, felt secondary to an embolic event. His LV function is preserved. We have been treating with rate control and anticoagulation at his request. His previous cardiac catheterization in August of 2009 revealed an occluded LAD but no other obstructive disease. The LAD occlusion was felt secondary to an embolus and resolved with thrombus aspiration. Abdominal ultrasound in October 2014 showed no aneurysm. Echocardiogram repeated in November 2014 and showed normal LV function, mild left ventricular hypertrophy and moderate left atrial enlargement. Carotid Dopplers in July 2013 showed 0-39% bilateral stenosis and followup was recommended in 2 years. Since I last saw him, the patient denies any dyspnea on exertion, orthopnea, PND, pedal edema, palpitations, syncope or chest pain.   Current Outpatient Prescriptions  Medication Sig Dispense Refill  . Ascorbic Acid (VITAMIN C) 500 MG tablet Take 500 mg by mouth daily.      . Calcium Carbonate-Vitamin D (CALCIUM-VITAMIN D) 600-200 MG-UNIT CAPS Take 1 tablet by mouth 2 (two) times daily.     Marland Kitchen CARTIA XT 300 MG 24 hr capsule take 1 capsule by mouth once daily 30 capsule 1  . Garlic Oil (SUPER GARLIC) 6045 MG CAPS Take 2 capsules by mouth daily.      Marland Kitchen losartan (COZAAR) 50 MG tablet Take 1 tablet (50 mg total) by mouth daily. 90 tablet 3  . Magnesium 300 MG CAPS Take 1 capsule by mouth daily.      . Multiple Vitamin (MULTIVITAMIN) tablet Take 1 tablet by mouth daily.      . tamsulosin (FLOMAX) 0.4 MG CAPS capsule Take 1 capsule (0.4 mg total) by mouth daily. 30 capsule 5  . vitamin E 400 UNIT capsule Take 400 Units by mouth daily.      Marland Kitchen warfarin (COUMADIN) 5 MG tablet Take as directed by coumadin clinic 180 tablet 1  . [DISCONTINUED] lovastatin (MEVACOR) 20 MG tablet Take 1 tablet (20 mg total) by mouth daily. At bedtime 30  tablet 11   No current facility-administered medications for this visit.     Past Medical History  Diagnosis Date  . Atrial fibrillation   . Hyperlipidemia   . Anterior myocardial infarction     Presumed secondary to embolus from atrial fibrillation  . Hypertension   . Arthritis   . Hard of hearing   . Hemorrhoid   . Hx of adenomatous colonic polyps 07/2008    Past Surgical History  Procedure Laterality Date  . Stapedes surgery      History   Social History  . Marital Status: Married    Spouse Name: N/A    Number of Children: 6  . Years of Education: N/A   Occupational History  . Retired   .     Social History Main Topics  . Smoking status: Former Research scientist (life sciences)  . Smokeless tobacco: Never Used  . Alcohol Use: No     Comment: occasional  . Drug Use: No  . Sexual Activity: No   Other Topics Concern  . Not on file   Social History Narrative    ROS: no fevers or chills, productive cough, hemoptysis, dysphasia, odynophagia, melena, hematochezia, dysuria, hematuria, rash, seizure activity, orthopnea, PND, pedal edema, claudication. Remaining systems are negative.  Physical Exam: Well-developed well-nourished in no acute distress.  Skin is warm and dry.  HEENT is normal.  Neck is supple.  Chest is clear to auscultation with normal  expansion.  Cardiovascular exam is irregular Abdominal exam nontender or distended. No masses palpated. Extremities show no edema. neuro grossly intact  ECG Atrial fibrillation at a rate of 74. No ST changes.

## 2014-01-11 NOTE — Assessment & Plan Note (Signed)
Scheduled follow-up carotid Dopplers. 

## 2014-01-11 NOTE — Assessment & Plan Note (Signed)
Blood pressure controlled. Continue present medications. 

## 2014-01-18 ENCOUNTER — Ambulatory Visit (HOSPITAL_COMMUNITY)
Admission: RE | Admit: 2014-01-18 | Discharge: 2014-01-18 | Disposition: A | Discharge status: Home / Self Care | Payer: Medicare Other | Source: Ambulatory Visit | Attending: Cardiovascular Disease | Admitting: Cardiovascular Disease

## 2014-01-18 DIAGNOSIS — I679 Cerebrovascular disease, unspecified: Secondary | ICD-10-CM | POA: Diagnosis not present

## 2014-01-18 DIAGNOSIS — I2510 Atherosclerotic heart disease of native coronary artery without angina pectoris: Secondary | ICD-10-CM | POA: Insufficient documentation

## 2014-01-18 NOTE — Progress Notes (Signed)
Carotid Duplex Completed. °Brianna L Mazza,RVT °

## 2014-02-07 ENCOUNTER — Other Ambulatory Visit: Payer: Self-pay | Admitting: Cardiology

## 2014-02-08 ENCOUNTER — Ambulatory Visit (INDEPENDENT_AMBULATORY_CARE_PROVIDER_SITE_OTHER): Payer: Medicare Other | Admitting: Surgery

## 2014-02-08 DIAGNOSIS — I482 Chronic atrial fibrillation, unspecified: Secondary | ICD-10-CM

## 2014-02-08 DIAGNOSIS — I4891 Unspecified atrial fibrillation: Secondary | ICD-10-CM | POA: Diagnosis not present

## 2014-02-08 DIAGNOSIS — Z5181 Encounter for therapeutic drug level monitoring: Secondary | ICD-10-CM | POA: Diagnosis not present

## 2014-02-08 LAB — POCT INR: INR: 3

## 2014-02-28 ENCOUNTER — Other Ambulatory Visit: Payer: Self-pay | Admitting: Geriatric Medicine

## 2014-02-28 MED ORDER — TAMSULOSIN HCL 0.4 MG PO CAPS: 0.4000 mg | ORAL_CAPSULE | Freq: Every day | ORAL | Status: DC

## 2014-03-22 ENCOUNTER — Ambulatory Visit (INDEPENDENT_AMBULATORY_CARE_PROVIDER_SITE_OTHER): Payer: Medicare Other | Admitting: *Deleted

## 2014-03-22 DIAGNOSIS — Z5181 Encounter for therapeutic drug level monitoring: Secondary | ICD-10-CM | POA: Diagnosis not present

## 2014-03-22 DIAGNOSIS — I482 Chronic atrial fibrillation, unspecified: Secondary | ICD-10-CM

## 2014-03-22 DIAGNOSIS — I4891 Unspecified atrial fibrillation: Secondary | ICD-10-CM | POA: Diagnosis not present

## 2014-03-22 LAB — POCT INR: INR: 2.8

## 2014-04-15 DIAGNOSIS — H4011X0 Primary open-angle glaucoma, stage unspecified: Secondary | ICD-10-CM | POA: Diagnosis not present

## 2014-04-15 DIAGNOSIS — H25011 Cortical age-related cataract, right eye: Secondary | ICD-10-CM | POA: Diagnosis not present

## 2014-04-15 DIAGNOSIS — H25041 Posterior subcapsular polar age-related cataract, right eye: Secondary | ICD-10-CM | POA: Diagnosis not present

## 2014-04-15 DIAGNOSIS — H4033X0 Glaucoma secondary to eye trauma, bilateral, stage unspecified: Secondary | ICD-10-CM | POA: Diagnosis not present

## 2014-05-03 ENCOUNTER — Ambulatory Visit (INDEPENDENT_AMBULATORY_CARE_PROVIDER_SITE_OTHER): Payer: Medicare Other | Admitting: *Deleted

## 2014-05-03 DIAGNOSIS — I4891 Unspecified atrial fibrillation: Secondary | ICD-10-CM | POA: Diagnosis not present

## 2014-05-03 DIAGNOSIS — Z5181 Encounter for therapeutic drug level monitoring: Secondary | ICD-10-CM

## 2014-05-03 LAB — POCT INR: INR: 3.3

## 2014-05-27 ENCOUNTER — Telehealth: Payer: Self-pay

## 2014-05-27 NOTE — Telephone Encounter (Signed)
No answer, no way to leave message about flu vaccine

## 2014-06-14 ENCOUNTER — Ambulatory Visit (INDEPENDENT_AMBULATORY_CARE_PROVIDER_SITE_OTHER): Payer: Medicare Other | Admitting: *Deleted

## 2014-06-14 DIAGNOSIS — I4891 Unspecified atrial fibrillation: Secondary | ICD-10-CM

## 2014-06-14 DIAGNOSIS — Z5181 Encounter for therapeutic drug level monitoring: Secondary | ICD-10-CM | POA: Diagnosis not present

## 2014-06-14 LAB — POCT INR: INR: 3

## 2014-07-15 DIAGNOSIS — H11151 Pinguecula, right eye: Secondary | ICD-10-CM | POA: Diagnosis not present

## 2014-07-15 DIAGNOSIS — H4011X0 Primary open-angle glaucoma, stage unspecified: Secondary | ICD-10-CM | POA: Diagnosis not present

## 2014-07-15 DIAGNOSIS — H269 Unspecified cataract: Secondary | ICD-10-CM | POA: Diagnosis not present

## 2014-07-26 ENCOUNTER — Ambulatory Visit (INDEPENDENT_AMBULATORY_CARE_PROVIDER_SITE_OTHER): Payer: Medicare Other | Admitting: *Deleted

## 2014-07-26 DIAGNOSIS — Z5181 Encounter for therapeutic drug level monitoring: Secondary | ICD-10-CM

## 2014-07-26 DIAGNOSIS — I4891 Unspecified atrial fibrillation: Secondary | ICD-10-CM

## 2014-07-26 LAB — POCT INR: INR: 2.9

## 2014-07-30 ENCOUNTER — Other Ambulatory Visit: Payer: Self-pay | Admitting: Cardiology

## 2014-08-26 ENCOUNTER — Other Ambulatory Visit: Payer: Self-pay

## 2014-08-26 MED ORDER — TAMSULOSIN HCL 0.4 MG PO CAPS: 0.4000 mg | ORAL_CAPSULE | Freq: Every day | ORAL | Status: DC

## 2014-09-06 ENCOUNTER — Other Ambulatory Visit: Payer: Self-pay | Admitting: Cardiology

## 2014-09-06 ENCOUNTER — Ambulatory Visit (INDEPENDENT_AMBULATORY_CARE_PROVIDER_SITE_OTHER): Payer: Medicare Other | Admitting: *Deleted

## 2014-09-06 DIAGNOSIS — I4891 Unspecified atrial fibrillation: Secondary | ICD-10-CM

## 2014-09-06 DIAGNOSIS — Z5181 Encounter for therapeutic drug level monitoring: Secondary | ICD-10-CM | POA: Diagnosis not present

## 2014-09-06 LAB — POCT INR: INR: 3.6

## 2014-09-08 ENCOUNTER — Telehealth: Payer: Self-pay

## 2014-09-08 NOTE — Telephone Encounter (Signed)
LVM if patient can come in for AWV prior to 9:30 apt. On 7/7.  To call back and advise.

## 2014-09-08 NOTE — Telephone Encounter (Signed)
Call back from [name]; States she is coming in at 8:30 for an apt, so she can [name] will be with her and available for AWV at 8:30 while she is at her apt with [name]. [name] then see [name] at 9:30 [name] states she is not on Medicare;

## 2014-09-15 ENCOUNTER — Encounter: Payer: Self-pay | Admitting: Internal Medicine

## 2014-09-15 ENCOUNTER — Ambulatory Visit (INDEPENDENT_AMBULATORY_CARE_PROVIDER_SITE_OTHER): Payer: Medicare Other | Admitting: Internal Medicine

## 2014-09-15 ENCOUNTER — Other Ambulatory Visit (INDEPENDENT_AMBULATORY_CARE_PROVIDER_SITE_OTHER): Payer: Medicare Other

## 2014-09-15 VITALS — BP 136/86 | HR 66 | Temp 98.1°F | Resp 16 | Ht 65.0 in | Wt 159.2 lb

## 2014-09-15 DIAGNOSIS — E7800 Pure hypercholesterolemia, unspecified: Secondary | ICD-10-CM

## 2014-09-15 DIAGNOSIS — N402 Nodular prostate without lower urinary tract symptoms: Secondary | ICD-10-CM

## 2014-09-15 DIAGNOSIS — Z0000 Encounter for general adult medical examination without abnormal findings: Secondary | ICD-10-CM | POA: Diagnosis not present

## 2014-09-15 DIAGNOSIS — E038 Other specified hypothyroidism: Secondary | ICD-10-CM

## 2014-09-15 DIAGNOSIS — I10 Essential (primary) hypertension: Secondary | ICD-10-CM

## 2014-09-15 DIAGNOSIS — Z23 Encounter for immunization: Secondary | ICD-10-CM

## 2014-09-15 DIAGNOSIS — B356 Tinea cruris: Secondary | ICD-10-CM

## 2014-09-15 LAB — TSH: TSH: 3.95 u[IU]/mL (ref 0.35–4.50)

## 2014-09-15 LAB — COMPREHENSIVE METABOLIC PANEL
ALT: 18 U/L (ref 0–53)
AST: 21 U/L (ref 0–37)
Albumin: 4.4 g/dL (ref 3.5–5.2)
Alkaline Phosphatase: 77 U/L (ref 39–117)
BUN: 17 mg/dL (ref 6–23)
CALCIUM: 9.8 mg/dL (ref 8.4–10.5)
CO2: 28 mEq/L (ref 19–32)
Chloride: 104 mEq/L (ref 96–112)
Creatinine, Ser: 1.17 mg/dL (ref 0.40–1.50)
GFR: 79.15 mL/min (ref >60.00)
Glucose, Bld: 106 mg/dL — ABNORMAL HIGH (ref 70–99)
POTASSIUM: 3.8 meq/L (ref 3.5–5.1)
SODIUM: 140 meq/L (ref 135–145)
Total Bilirubin: 1 mg/dL (ref 0.2–1.2)
Total Protein: 7.1 g/dL (ref 6.0–8.3)

## 2014-09-15 LAB — CBC WITH DIFFERENTIAL/PLATELET
BASOS ABS: 0 10*3/uL (ref 0.0–0.1)
BASOS PCT: 0.4 % (ref 0.0–3.0)
Eosinophils Absolute: 0.2 10*3/uL (ref 0.0–0.7)
Eosinophils Relative: 2.1 % (ref 0.0–5.0)
HCT: 46.7 % (ref 39.0–52.0)
HEMOGLOBIN: 15.8 g/dL (ref 13.0–17.0)
Lymphocytes Relative: 25.6 % (ref 12.0–46.0)
Lymphs Abs: 2.3 10*3/uL (ref 0.7–4.0)
MCHC: 33.8 g/dL (ref 30.0–36.0)
MCV: 96 fl (ref 78.0–100.0)
Monocytes Absolute: 0.9 10*3/uL (ref 0.1–1.0)
Monocytes Relative: 9.7 % (ref 3.0–12.0)
NEUTROS ABS: 5.7 10*3/uL (ref 1.4–7.7)
Neutrophils Relative %: 62.2 % (ref 43.0–77.0)
Platelets: 162 10*3/uL (ref 150.0–400.0)
RBC: 4.87 Mil/uL (ref 4.22–5.81)
RDW: 12.6 % (ref 11.5–15.5)
WBC: 9.2 10*3/uL (ref 4.0–10.5)

## 2014-09-15 LAB — URINALYSIS, ROUTINE W REFLEX MICROSCOPIC
Bilirubin Urine: NEGATIVE
Hgb urine dipstick: NEGATIVE
KETONES UR: NEGATIVE
Leukocytes, UA: NEGATIVE
NITRITE: NEGATIVE
PH: 7.5 (ref 5.0–8.0)
RBC / HPF: NONE SEEN (ref 0–?)
Specific Gravity, Urine: 1.01 (ref 1.000–1.030)
TOTAL PROTEIN, URINE-UPE24: NEGATIVE
Urine Glucose: NEGATIVE
Urobilinogen, UA: 0.2 (ref 0.0–1.0)
WBC, UA: NONE SEEN (ref 0–?)

## 2014-09-15 LAB — LIPID PANEL
CHOLESTEROL: 210 mg/dL — AB (ref 0–200)
HDL: 35 mg/dL — AB (ref >39.00)
LDL CALC: 135 mg/dL — AB (ref 0–99)
NonHDL: 175
Total CHOL/HDL Ratio: 6
Triglycerides: 200 mg/dL — ABNORMAL HIGH (ref 0.0–149.0)
VLDL: 40 mg/dL (ref 0.0–40.0)

## 2014-09-15 LAB — FECAL OCCULT BLOOD, GUAIAC: Fecal Occult Blood: NEGATIVE

## 2014-09-15 LAB — PSA: PSA: 1.7 ng/mL (ref 0.10–4.00)

## 2014-09-15 MED ORDER — CICLOPIROX 0.77 % EX GEL: 1.0000 | Freq: Two times a day (BID) | CUTANEOUS | Status: DC

## 2014-09-15 MED ORDER — DILTIAZEM HCL ER COATED BEADS 300 MG PO CP24: 300.0000 mg | ORAL_CAPSULE | Freq: Every day | ORAL | Status: DC

## 2014-09-15 MED ORDER — LOSARTAN POTASSIUM 50 MG PO TABS: 50.0000 mg | ORAL_TABLET | Freq: Every day | ORAL | Status: DC

## 2014-09-15 MED ORDER — TAMSULOSIN HCL 0.4 MG PO CAPS: 0.4000 mg | ORAL_CAPSULE | Freq: Every day | ORAL | Status: DC

## 2014-09-15 NOTE — Progress Notes (Signed)
Subjective:   [name] is a 69 y.o. male who presents for Medicare Annual/Subsequent preventive examination.  Review of Systems:   HRA assessment completed during visit;  Patient is here for Annual Wellness Assessment  Personalized education given regarding risk on the problem list that are currently being medically managed; discusses heart attack x 69 yo. Didn't have any symptoms but got tired and had h/a    Lifestyle changes reviewed:   Diet: States he has put on some weight; Eats chicken, fish and salmon; ground [place]; vegetables Eats out a lot; drinks a lot of water Exercise:go to the gym 4 to 5 times a week Does tai chi;  Steps 10000 per day is goal   Goal is to lose weight 150 to 155;   SAFETY - 2 level home; no problem going up and down stairs  Generalized Safety in the home reviewed/   Fall prevention;  Urinary or fecal incontinence reviewed  Educated on prevention falls; Exercise, toning and strengthening; Balance exercises  Home/ safety; removal of throw rugs; bathroom handrails; Un-level surfaces outside; Smoke detectors, sun protection reviewed; wearing seatbelts for safety   Screenings overdue Ophthalmology exam; goes annually and will go in Nov/ Dr. 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 Former Smoker  Smokeless tobacco  . Never Used    Comment: quit x 35 years ago     Counseling given: Yes   Past Medical History  Diagnosis Date  . Atrial fibrillation   . Hyperlipidemia   . 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 Hypertension   . Arthritis   . Hard of hearing   . Hemorrhoid   . Hx of adenomatous colonic polyps 07/2008   Past Surgical History  Procedure Laterality Date  . Stapedes surgery     Family History  Problem Relation Age of Onset  . Heart disease Mother   . Cancer Neg Hx   . Stroke Neg Hx   . Hypertension Neg Hx   . Hyperlipidemia Neg Hx   . Diabetes Neg Hx    History  Sexual Activity  . Sexual Activity: No    Outpatient Encounter Prescriptions as of 09/15/2014  Medication Sig  . Ascorbic Acid (VITAMIN C) 500 MG tablet Take 500 mg by mouth daily.    . Calcium Carbonate-Vitamin D (CALCIUM-VITAMIN D) 600-200 MG-UNIT CAPS Take 1 tablet by mouth 2 (two) times daily.   Marland Kitchen diltiazem (CARTIA XT) 300 MG 24 hr capsule Take 1 capsule (300 mg total) by mouth daily.  . Garlic Oil (SUPER GARLIC) 0938 MG CAPS Take 2 capsules by mouth daily.    Marland Kitchen losartan (COZAAR) 50 MG tablet take 1 tablet by mouth once daily  . Magnesium 300 MG CAPS Take 1 capsule by mouth daily.    . Multiple Vitamin (MULTIVITAMIN) tablet Take 1 tablet by mouth daily.    . tamsulosin (FLOMAX) 0.4 MG CAPS capsule Take 1 capsule (0.4 mg  total) by mouth daily.  . vitamin E 400 UNIT capsule Take 400 Units by mouth daily.    Marland Kitchen warfarin (COUMADIN) 5 MG tablet Take 1.5 tablets by mouth daily or as directed by coumadin clinic   No facility-administered encounter medications on file as of 09/15/2014.    Activities of Daily Living In your present state of health, do you have any difficulty performing the following activities: 09/15/2014  Hearing? Y  Vision? N  Difficulty concentrating or making decisions? N  Walking or climbing stairs? N  Dressing or bathing? N  Doing errands, shopping? N    Patient Care Team: Janith Lima, MD as PCP - General (Internal Medicine)   Assessment:     Objective:   The goal of the wellness visit is to assist the patient how to close the  gaps in care and create a preventative care plan for the patient.  Personalized Education was given regarding:   Pt determined a personalized goal; see patient goals; weight loss  Assessment included: smoking (secondary) educated as appropriate;   Calcium and Vit D as appropriate/ Osteoporosis risk reviewed  Taking meds without issues; no barriers identified;  States he has one issue; would like the physician to write his meds for the entire year; Would like all of his meds filled for the year; otherwsie, he runs out over the weekends and can't get refills.  Labs were and fup visit noted with MD if labs are due to be re-drawn.  Stress: Recommendations for managing stress if assessed as a factor;   No Risk for hepatitis or high risk social behavior identified via hepatitis screen  Educated on shingles and follow up with insurance company for co-pays or charges applied to Part D benefit. Educated on Vaccines;  Safety issues reviewed;  Cognition assessed by AD8; Score 0; uses his mind; Keeps mentally engaged in computers .   Depression screen negative;   Functional status reviewed; no losses in function x 1 year  Bowel and bladder issues assessed Oatmeal and honey helps with constipation   End of life planning was discussed;   Exercise Activities and Dietary recommendations    Goals    . Weight < 150 lb (68.04 kg)     Will try to monitor portions; feels this is where his weight; already does 10000 steps a day       Fall Risk Fall Risk  09/15/2014 08/18/2013  Falls in the past year? No No   Depression Screen PHQ 2/9 Scores 09/15/2014 08/18/2013  PHQ - 2 Score 0 0    Cognitive Testing No flowsheet data found.  Immunization History  Administered Date(s) Administered  . Pneumococcal Polysaccharide-23 12/20/2009  . Td 03/11/1996, 06/29/2008   Screening Tests Health Maintenance  Topic Date Due  . PNA vac Low Risk Adult (2 of 2 - PCV13) 12/21/2010  . INFLUENZA  VACCINE  10/10/2014  . COLONOSCOPY  07/16/2016  . TETANUS/TDAP  06/30/2018  . ZOSTAVAX  Addressed      Plan:    Plan  Review of screenings completed and plan for completion individualized: Education and counseling performed based on assessed risks today. Patient provided with a copy of personalized plan for preventive services.    Goals; to lose weight; Stay engaged mentally; Maintain health;  Monitors portions; does not eat red meat  Referrals: none needed at this time  The patient agrees to: take prevnar today  Advanced directive: no but wife knows his wishes    During the course  of the visit the patient was educated and counseled about the following appropriate screening and preventive services:   Vaccines to include Pneumoccal, Influenza, Hepatitis B, Td, Zostavax, HCV/ will check with insurer regarding shingles  Electrocardiogram  Cardiovascular Disease/ well informed/   Colorectal cancer screening no due  Diabetes screening/ n/a  Prostate Cancer Screening  Glaucoma screening / being treated  Nutrition counseling / will cut back on portions  Smoking cessation counseling  Patient Instructions (the written plan) was given to the patient.    Wynetta Fines, RN  09/15/2014

## 2014-09-15 NOTE — Patient Instructions (Addendum)
[name] , Thank you for taking time to come for your Medicare Wellness Visit. I appreciate your ongoing commitment to your health goals. Please review the following plan we discussed and let me know if I can assist you in the future.   Will take prevnar (13) today Will cut back on sugar   Discussed cholesterol;and HDL;  Controllable  risk for heart disease reviewed for goal setting: Includes; family hx; HTN; decreased renal function; obesity; sedentary lifestyle Educated regarding Metabolic syndrome / Factors that increase risk for Heart Disease:  Excess body fat around the waist Waist circumference for Men >40  Triglycerides > 150 HDL < 50; Goal is 60 BP > 130/85 Glucose > 100 Goals are determined by the physician and based on other relevant data and risk Plan Lifestyle changes  BMI reviewed 26   Risk Calculator: [email or web address]   These are the goals we discussed: Goals    . Weight < 150 lb (68.04 kg)     Will try to monitor portions; feels this is where his weight; already does 10000 steps a day        This is a list of the screening recommended for you and due dates:  Health Maintenance  Topic Date Due  . Pneumonia vaccines (2 of 2 - PCV13) 12/21/2010  . Flu Shot  10/10/2014  . Colon Cancer Screening  07/16/2016  . Tetanus Vaccine  06/30/2018  . Shingles Vaccine  Addressed   Serving Sizes What we call a serving size today is larger than it was in the past. A 1950s fast-food burger contained little more than 1 oz of meat, and a soft drink was 8 oz (1 cup). Today, a "quarter pounder" burger is at least 4 times that amount, and a 32 or 64 oz drink is not uncommon. A possible guide for eating when trying to lose weight is to eat about half as much as you normally do. Some estimates of serving sizes are:  1 Dairy serving:Individual container of yogurt (8 oz) or piece of cheese the size of your thumb (1 oz).  1 Grain serving: 1 slice of bread or  cup  pasta.  1 Meat serving: The size of a deck of cards (3 oz).  1 Fruit serving: cup canned fruit or 1 medium fruit.  1 Vegetable serving:  cup of cooked or canned vegetables.  1 Fat serving:The size of 4 stacked dimes. Experts suggest spending 1 or 2 days measuring food portions you commonly eat. This will give you better practice at estimating serving sizes, and will also show whether you are eating an appropriate amount of food to meet your weight goals. If you find that you are eating more than you thought, try measuring your food for a few days so you can "reprogram" yourself to learn what makes a healthy portion for you. SUGGESTIONS FOR CONTROL  In restaurants, share entrees, or ask the waiter to put half the entre in a box or bag before you even touch it.  Order lunch-sized portions. Many restaurants serve 4 to 6 oz of meat at lunch, compared with 8 to 10 oz at dinner.  Split dessert or skip it all together. Have a piece of fruit when you get home.  At home, use smaller plates and bowls. It will look as if you are eating more.  Plate your food in the kitchen rather than serving it "family style" at the table.  Wait 20 to 30 minutes before taking seconds. This is  how long it takes your brain to recognize that you are full.  Check food labels for serving sizes. Eat 1 serving only.  Use measuring cups and spoons to see proper serving sizes.  Buy smaller packages of candy, popcorn, and snacks.  Avoid eating directly out of the bag or carton.  While eating half as much, exercise twice as much. Park further away from the mall, take the stairs instead of the escalator, and walk around your block. Losing weight is a slow, difficult process. It takes long-lasting lifestyle changes. You can make gradual changes over time so they become habits. Look to friends and family to support the healthy changes you are making. Avoid fad diets since they are often only temporary weight loss  solutions. Document Released: 11/24/2002 Document Revised: 05/20/2011 Document Reviewed: 05/25/2013 [facility] Patient Information 2015 [place], [place]. This information is not intended to replace advice given to you by your health care provider. Make sure you discuss any questions you have with your health care provider.  Health Maintenance A healthy lifestyle and preventative care can promote health and wellness.  Maintain regular health, dental, and eye exams.  Eat a healthy diet. Foods like vegetables, fruits, whole grains, low-fat dairy products, and lean protein foods contain the nutrients you need and are low in calories. Decrease your intake of foods high in solid fats, added sugars, and salt. Get information about a proper diet from your health care provider, if necessary.  Regular physical exercise is one of the most important things you can do for your health. Most adults should get at least 150 minutes of moderate-intensity exercise (any activity that increases your heart rate and causes you to sweat) each week. In addition, most adults need muscle-strengthening exercises on 2 or more days a week.   Maintain a healthy weight. The body mass index (BMI) is a screening tool to identify possible weight problems. It provides an estimate of body fat based on height and weight. Your health care provider can find your BMI and can help you achieve or maintain a healthy weight. For males 20 years and older:  A BMI below 18.5 is considered underweight.  A BMI of 18.5 to 24.9 is normal.  A BMI of 25 to 29.9 is considered overweight.  A BMI of 30 and above is considered obese.  Maintain normal blood lipids and cholesterol by exercising and minimizing your intake of saturated fat. Eat a balanced diet with plenty of fruits and vegetables. Blood tests for lipids and cholesterol should begin at age 35 and be repeated every 5 years. If your lipid or cholesterol levels are high, you are over age 77,  or you are at high risk for heart disease, you may need your cholesterol levels checked more frequently.Ongoing high lipid and cholesterol levels should be treated with medicines if diet and exercise are not working.  If you smoke, find out from your health care provider how to quit. If you do not use tobacco, do not start.  Lung cancer screening is recommended for adults aged 63-80 years who are at high risk for developing lung cancer because of a history of smoking. A yearly low-dose CT scan of the lungs is recommended for people who have at least a 30-pack-year history of smoking and are current smokers or have quit within the past 15 years. A pack year of smoking is smoking an average of 1 pack of cigarettes a day for 1 year (for example, a 30-pack-year history of smoking could  mean smoking 1 pack a day for 30 years or 2 packs a day for 15 years). Yearly screening should continue until the smoker has stopped smoking for at least 15 years. Yearly screening should be stopped for people who develop a health problem that would prevent them from having lung cancer treatment.  If you choose to drink alcohol, do not have more than 2 drinks per day. One drink is considered to be 12 oz (360 mL) of beer, 5 oz (150 mL) of wine, or 1.5 oz (45 mL) of liquor.  Avoid the use of street drugs. Do not share needles with anyone. Ask for help if you need support or instructions about stopping the use of drugs.  High blood pressure causes heart disease and increases the risk of stroke. Blood pressure should be checked at least every 1-2 years. Ongoing high blood pressure should be treated with medicines if weight loss and exercise are not effective.  If you are 57-42 years old, ask your health care provider if you should take aspirin to prevent heart disease.  Diabetes screening involves taking a blood sample to check your fasting blood sugar level. This should be done once every 3 years after age 63 if you are at a  normal weight and without risk factors for diabetes. Testing should be considered at a younger age or be carried out more frequently if you are overweight and have at least 1 risk factor for diabetes.  Colorectal cancer can be detected and often prevented. Most routine colorectal cancer screening begins at the age of 26 and continues through age 57. However, your health care provider may recommend screening at an earlier age if you have risk factors for colon cancer. On a yearly basis, your health care provider may provide home test kits to check for hidden blood in the stool. A small camera at the end of a tube may be used to directly examine the colon (sigmoidoscopy or colonoscopy) to detect the earliest forms of colorectal cancer. Talk to your health care provider about this at age 33 when routine screening begins. A direct exam of the colon should be repeated every 5-10 years through age 32, unless early forms of precancerous polyps or small growths are found.  People who are at an increased risk for hepatitis B should be screened for this virus. You are considered at high risk for hepatitis B if:  You were born in a country where hepatitis B occurs often. Talk with your health care provider about which countries are considered high risk.  Your parents were born in a high-risk country and you have not received a shot to protect against hepatitis B (hepatitis B vaccine).  You have HIV or AIDS.  You use needles to inject street drugs.  You live with, or have sex with, someone who has hepatitis B.  You are a man who has sex with other men (MSM).  You get hemodialysis treatment.  You take certain medicines for conditions like cancer, organ transplantation, and autoimmune conditions.  Hepatitis C blood testing is recommended for all people born from [birth year] through 1966 and any individual with known risk factors for hepatitis C.  Healthy men should no longer receive prostate-specific antigen  (PSA) blood tests as part of routine cancer screening. Talk to your health care provider about prostate cancer screening.  Testicular cancer screening is not recommended for adolescents or adult males who have no symptoms. Screening includes self-exam, a health care provider exam, and other screening  tests. Consult with your health care provider about any symptoms you have or any concerns you have about testicular cancer.  Practice safe sex. Use condoms and avoid high-risk sexual practices to reduce the spread of sexually transmitted infections (STIs).  You should be screened for STIs, including gonorrhea and chlamydia if:  You are sexually active and are younger than 24 years.  You are older than 24 years, and your health care provider tells you that you are at risk for this type of infection.  Your sexual activity has changed since you were last screened, and you are at an increased risk for chlamydia or gonorrhea. Ask your health care provider if you are at risk.  If you are at risk of being infected with HIV, it is recommended that you take a prescription medicine daily to prevent HIV infection. This is called pre-exposure prophylaxis (PrEP). You are considered at risk if:  You are a man who has sex with other men (MSM).  You are a heterosexual man who is sexually active with multiple partners.  You take drugs by injection.  You are sexually active with a partner who has HIV.  Talk with your health care provider about whether you are at high risk of being infected with HIV. If you choose to begin PrEP, you should first be tested for HIV. You should then be tested every 3 months for as long as you are taking PrEP.  Use sunscreen. Apply sunscreen liberally and repeatedly throughout the day. You should seek shade when your shadow is shorter than you. Protect yourself by wearing long sleeves, pants, a wide-brimmed hat, and sunglasses year round whenever you are outdoors.  Tell your health  care provider of new moles or changes in moles, especially if there is a change in shape or color. Also, tell your health care provider if a mole is larger than the size of a pencil eraser.  A one-time screening for abdominal aortic aneurysm (AAA) and surgical repair of large AAAs by ultrasound is recommended for men aged 28-75 years who are current or former smokers.  Stay current with your vaccines (immunizations). Document Released: 08/24/2007 Document Revised: 03/02/2013 Document Reviewed: 07/23/2010 [facility] Patient Information 2015 [place], [place]. This information is not intended to replace advice given to you by your health care provider. Make sure you discuss any questions you have with your health care provider.

## 2014-09-15 NOTE — Assessment & Plan Note (Signed)
This appears to be candida, will treat with loprox gel

## 2014-09-15 NOTE — Assessment & Plan Note (Signed)

## 2014-09-15 NOTE — Progress Notes (Signed)
Subjective:  Patient ID: [name], male    DOB: 05/20/1945  Age: 69 y.o. MRN: [id number]  CC: Medicare Wellness; Hypothyroidism; Hypertension; Annual Exam; and Rash   HPI [name] presents for a CPX but he also complains of an itchy rash in his groin for several weeks.  Outpatient Prescriptions Prior to Visit  Medication Sig Dispense Refill  . Ascorbic Acid (VITAMIN C) 500 MG tablet Take 500 mg by mouth daily.      . Calcium Carbonate-Vitamin D (CALCIUM-VITAMIN D) 600-200 MG-UNIT CAPS Take 1 tablet by mouth 2 (two) times daily.     . Garlic Oil (SUPER GARLIC) 6301 MG CAPS Take 2 capsules by mouth daily.      . Magnesium 300 MG CAPS Take 1 capsule by mouth daily.      . Multiple Vitamin (MULTIVITAMIN) tablet Take 1 tablet by mouth daily.      . vitamin E 400 UNIT capsule Take 400 Units by mouth daily.      Marland Kitchen warfarin (COUMADIN) 5 MG tablet Take 1.5 tablets by mouth daily or as directed by coumadin clinic 50 tablet 5  . diltiazem (CARTIA XT) 300 MG 24 hr capsule Take 1 capsule (300 mg total) by mouth daily. 30 capsule 11  . losartan (COZAAR) 50 MG tablet take 1 tablet by mouth once daily 90 tablet 1  . tamsulosin (FLOMAX) 0.4 MG CAPS capsule Take 1 capsule (0.4 mg total) by mouth daily. 30 capsule 0   No facility-administered medications prior to visit.    ROS Review of Systems  Constitutional: Negative.  Negative for fever, chills, diaphoresis, appetite change and fatigue.  HENT: Negative.  Negative for sore throat, trouble swallowing and voice change.   Eyes: Negative.   Respiratory: Negative.  Negative for cough, choking, chest tightness, shortness of breath and stridor.   Cardiovascular: Negative.  Negative for chest pain, palpitations and leg swelling.  Gastrointestinal: Negative.  Negative for nausea, vomiting, abdominal pain, diarrhea, constipation and blood in stool.  Endocrine: Negative.  Negative for polydipsia, polyphagia and polyuria.  Genitourinary:  Negative.  Negative for dysuria, flank pain, penile swelling, scrotal swelling, difficulty urinating, penile pain and testicular pain.  Musculoskeletal: Negative.  Negative for myalgias, back pain, arthralgias and neck pain.  Skin: Positive for rash. Negative for color change, pallor and wound.  Allergic/Immunologic: Negative.   Neurological: Negative.  Negative for dizziness, tremors, syncope, light-headedness, numbness and headaches.  Hematological: Negative.  Negative for adenopathy. Does not bruise/bleed easily.  Psychiatric/Behavioral: Negative.     Objective:  BP 136/86 mmHg  Pulse 66  Temp(Src) 98.1 F (36.7 C) (Oral)  Resp 16  Ht 5\' 5"  (1.651 m)  Wt 159 lb 4 oz (72.235 kg)  BMI 26.50 kg/m2  SpO2 98%  BP Readings from Last 3 Encounters:  09/15/14 136/86  01/11/14 130/80  08/18/13 128/88    Wt Readings from Last 3 Encounters:  09/15/14 159 lb 4 oz (72.235 kg)  01/11/14 154 lb (69.854 kg)  08/18/13 152 lb (68.947 kg)    Physical Exam  Constitutional: He is oriented to person, place, and time. He appears well-developed and well-nourished. No distress.  HENT:  Head: Normocephalic and atraumatic.  Mouth/Throat: Oropharynx is clear and moist. No oropharyngeal exudate.  Eyes: Conjunctivae are normal. Right eye exhibits no discharge. Left eye exhibits no discharge. No scleral icterus.  Neck: Normal range of motion. Neck supple. No JVD present. No tracheal deviation present. No thyromegaly present.  Cardiovascular: Normal rate, regular  rhythm, normal heart sounds and intact distal pulses.  Exam reveals no gallop and no friction rub.   No murmur heard. Pulmonary/Chest: Effort normal and breath sounds normal. No stridor. No respiratory distress. He has no wheezes. He has no rales. He exhibits no tenderness.  Abdominal: Soft. Bowel sounds are normal. He exhibits no distension and no mass. There is no tenderness. There is no rebound and no guarding. Hernia confirmed negative in  the right inguinal area and confirmed negative in the left inguinal area.  Genitourinary: Rectum normal, prostate normal, testes normal and penis normal. Rectal exam shows no external hemorrhoid, no internal hemorrhoid, no fissure, no mass, no tenderness and anal tone normal. Guaiac negative stool.    Prostate is not enlarged and not tender. Right testis shows no mass, no swelling and no tenderness. Right testis is descended. Left testis shows no mass, no swelling and no tenderness. Left testis is descended. Uncircumcised. No phimosis, paraphimosis, hypospadias, penile erythema or penile tenderness. No discharge found.  Musculoskeletal: Normal range of motion. He exhibits no edema or tenderness.  Lymphadenopathy:    He has no cervical adenopathy.       Right: No inguinal adenopathy present.       Left: No inguinal adenopathy present.  Neurological: He is oriented to person, place, and time.  Skin: Skin is warm and dry. Rash noted. He is not diaphoretic. No erythema. No pallor.  Psychiatric: He has a normal mood and affect. His behavior is normal. Judgment and thought content normal.    Lab Results  Component Value Date   WBC 7.4 08/18/2013   HGB 15.0 08/18/2013   HCT 44.4 08/18/2013   PLT 165.0 08/18/2013   GLUCOSE 93 08/18/2013   CHOL 193 08/18/2013   TRIG 136.0 08/18/2013   HDL 39.50 08/18/2013   LDLDIRECT 106.4 08/02/2010   LDLCALC 126* 08/18/2013   ALT 15 08/18/2013   AST 17 08/18/2013   NA 139 08/18/2013   K 4.2 08/18/2013   CL 104 08/18/2013   CREATININE 1.1 08/18/2013   BUN 14 08/18/2013   CO2 28 08/18/2013   TSH 3.73 08/18/2013   PSA 2.08 08/18/2013   INR 3.6 09/06/2014   HGBA1C 4.8 08/18/2013    No results found.  Assessment & Plan:   [name] was seen today for medicare wellness, hypothyroidism, hypertension and annual exam.  Diagnoses and all orders for this visit:  Other specified hypothyroidism- I will recheck his TSH and will treat if indicated Orders: -      TSH; Future  Prostate nodule without urinary obstruction- exam is WNL, will monitor his PSA Orders: -     PSA; Future -     Urinalysis, Routine w reflex microscopic (not at [facility]); Future -     tamsulosin (FLOMAX) 0.4 MG CAPS capsule; Take 1 capsule (0.4 mg total) by mouth daily.  Essential hypertension- his BP is well controlled, will monitor his lytes and renal function Orders: -     Comprehensive metabolic panel; Future -     CBC with Differential/Platelet; Future -     diltiazem (CARTIA XT) 300 MG 24 hr capsule; Take 1 capsule (300 mg total) by mouth daily. -     losartan (COZAAR) 50 MG tablet; Take 1 tablet (50 mg total) by mouth daily.  Pure hypercholesterolemia- he has had myalgias with 3 diff statins and is not willing to try another one, he is also not willing to start a PCSK9 agent as he "hates needles", he is  also not willing to try zetia or welchol Orders: -     Lipid panel; Future -     Comprehensive metabolic panel; Future -     TSH; Future  Routine history and physical examination of adult  Need for prophylactic vaccination against Streptococcus pneumoniae (pneumococcus) Orders: -     PCV 13 (Prevnar)  I have changed Mr. Bienvenue losartan. I am also having him start on Ciclopirox. Additionally, I am having him maintain his Calcium-Vitamin D, Magnesium, multivitamin, Garlic Oil, vitamin C, vitamin E, warfarin, tamsulosin, and diltiazem.  Meds ordered this encounter  Medications  . tamsulosin (FLOMAX) 0.4 MG CAPS capsule    Sig: Take 1 capsule (0.4 mg total) by mouth daily.    Dispense:  90 capsule    Refill:  3  . diltiazem (CARTIA XT) 300 MG 24 hr capsule    Sig: Take 1 capsule (300 mg total) by mouth daily.    Dispense:  90 capsule    Refill:  3  . losartan (COZAAR) 50 MG tablet    Sig: Take 1 tablet (50 mg total) by mouth daily.    Dispense:  90 tablet    Refill:  3  . Ciclopirox 0.77 % gel    Sig: Apply 1 Act topically 2 (two) times daily.     Dispense:  45 g    Refill:  3     Follow-up: Return in about 6 months (around 03/18/2015).  Scarlette Calico, MD

## 2014-09-15 NOTE — Progress Notes (Signed)
Pre visit review using our clinic review tool, if applicable. No additional management support is needed unless otherwise documented below in the visit note. 

## 2014-10-04 ENCOUNTER — Ambulatory Visit (INDEPENDENT_AMBULATORY_CARE_PROVIDER_SITE_OTHER): Payer: Medicare Other | Admitting: *Deleted

## 2014-10-04 DIAGNOSIS — Z5181 Encounter for therapeutic drug level monitoring: Secondary | ICD-10-CM | POA: Diagnosis not present

## 2014-10-04 DIAGNOSIS — I4891 Unspecified atrial fibrillation: Secondary | ICD-10-CM | POA: Diagnosis not present

## 2014-10-04 LAB — POCT INR: INR: 3.3

## 2014-10-25 ENCOUNTER — Ambulatory Visit (INDEPENDENT_AMBULATORY_CARE_PROVIDER_SITE_OTHER): Payer: Medicare Other | Admitting: *Deleted

## 2014-10-25 DIAGNOSIS — I4891 Unspecified atrial fibrillation: Secondary | ICD-10-CM

## 2014-10-25 DIAGNOSIS — Z5181 Encounter for therapeutic drug level monitoring: Secondary | ICD-10-CM

## 2014-10-25 LAB — POCT INR: INR: 3.7

## 2014-11-16 ENCOUNTER — Ambulatory Visit (INDEPENDENT_AMBULATORY_CARE_PROVIDER_SITE_OTHER): Payer: Medicare Other | Admitting: *Deleted

## 2014-11-16 DIAGNOSIS — Z5181 Encounter for therapeutic drug level monitoring: Secondary | ICD-10-CM | POA: Diagnosis not present

## 2014-11-16 DIAGNOSIS — I4891 Unspecified atrial fibrillation: Secondary | ICD-10-CM | POA: Diagnosis not present

## 2014-11-16 LAB — POCT INR: INR: 3.4

## 2014-12-06 ENCOUNTER — Ambulatory Visit (INDEPENDENT_AMBULATORY_CARE_PROVIDER_SITE_OTHER): Payer: Medicare Other | Admitting: *Deleted

## 2014-12-06 DIAGNOSIS — I4891 Unspecified atrial fibrillation: Secondary | ICD-10-CM | POA: Diagnosis not present

## 2014-12-06 DIAGNOSIS — Z5181 Encounter for therapeutic drug level monitoring: Secondary | ICD-10-CM

## 2014-12-06 LAB — POCT INR: INR: 2.8

## 2015-01-03 ENCOUNTER — Ambulatory Visit (INDEPENDENT_AMBULATORY_CARE_PROVIDER_SITE_OTHER): Payer: Medicare Other

## 2015-01-03 DIAGNOSIS — Z5181 Encounter for therapeutic drug level monitoring: Secondary | ICD-10-CM

## 2015-01-03 DIAGNOSIS — I4891 Unspecified atrial fibrillation: Secondary | ICD-10-CM

## 2015-01-03 LAB — POCT INR: INR: 3

## 2015-01-10 ENCOUNTER — Telehealth: Payer: Self-pay | Admitting: Cardiology

## 2015-01-12 NOTE — Telephone Encounter (Signed)
Close encounter 

## 2015-01-30 DIAGNOSIS — H401122 Primary open-angle glaucoma, left eye, moderate stage: Secondary | ICD-10-CM | POA: Diagnosis not present

## 2015-01-30 DIAGNOSIS — H401113 Primary open-angle glaucoma, right eye, severe stage: Secondary | ICD-10-CM | POA: Diagnosis not present

## 2015-01-30 DIAGNOSIS — H2513 Age-related nuclear cataract, bilateral: Secondary | ICD-10-CM | POA: Diagnosis not present

## 2015-01-30 DIAGNOSIS — H25013 Cortical age-related cataract, bilateral: Secondary | ICD-10-CM | POA: Diagnosis not present

## 2015-02-10 NOTE — Progress Notes (Signed)
HPI: FU Atrial fibrillation; history of an acute anterior myocardial infarction occurring in the setting of atrial fibrillation, felt secondary to an embolic event. His LV function is preserved. We have been treating with rate control and anticoagulation at his request. His previous cardiac catheterization in August of 2009 revealed an occluded LAD but no other obstructive disease. The LAD occlusion was felt secondary to an embolus and resolved with thrombus aspiration. Abdominal ultrasound in October 2014 showed no aneurysm. Echocardiogram repeated in November 2014 and showed normal LV function, mild left ventricular hypertrophy and moderate left atrial enlargement. Carotid Dopplers in Nov 2015 showed no significant stenosis. Since I last saw him, the patient denies any dyspnea on exertion, orthopnea, PND, pedal edema, palpitations, syncope or chest pain. No bleeding.  Current Outpatient Prescriptions  Medication Sig Dispense Refill  . Ascorbic Acid (VITAMIN C) 500 MG tablet Take 500 mg by mouth daily.      . Calcium Carbonate-Vitamin D (CALCIUM-VITAMIN D) 600-200 MG-UNIT CAPS Take 1 tablet by mouth 2 (two) times daily.     . Ciclopirox 0.77 % gel Apply 1 Act topically 2 (two) times daily. 45 g 3  . diltiazem (CARTIA XT) 300 MG 24 hr capsule Take 1 capsule (300 mg total) by mouth daily. 90 capsule 3  . Garlic Oil (SUPER GARLIC) 123XX123 MG CAPS Take 2 capsules by mouth daily.      Marland Kitchen losartan (COZAAR) 50 MG tablet Take 1 tablet (50 mg total) by mouth daily. 90 tablet 3  . Magnesium 300 MG CAPS Take 1 capsule by mouth daily.      . Multiple Vitamin (MULTIVITAMIN) tablet Take 1 tablet by mouth daily.      . tamsulosin (FLOMAX) 0.4 MG CAPS capsule Take 1 capsule (0.4 mg total) by mouth daily. 90 capsule 3  . vitamin E 400 UNIT capsule Take 400 Units by mouth daily.      Marland Kitchen warfarin (COUMADIN) 5 MG tablet Take 1.5 tablets by mouth daily or as directed by coumadin clinic 50 tablet 5  . [DISCONTINUED]  lovastatin (MEVACOR) 20 MG tablet Take 1 tablet (20 mg total) by mouth daily. At bedtime 30 tablet 11   No current facility-administered medications for this visit.     Past Medical History  Diagnosis Date  . Atrial fibrillation ([place])   . Hyperlipidemia   . Anterior myocardial infarction [facility])     Presumed secondary to embolus from atrial fibrillation  . Hypertension   . Arthritis   . Hard of hearing   . Hemorrhoid   . Hx of adenomatous colonic polyps 07/2008    Past Surgical History  Procedure Laterality Date  . Stapedes surgery      Social History   Social History  . Marital Status: Married    Spouse Name: N/A  . Number of Children: 6  . Years of Education: N/A   Occupational History  . Retired   .     Social History Main Topics  . Smoking status: Former Research scientist (life sciences)  . Smokeless tobacco: Never Used     Comment: quit x 35 years ago  . Alcohol Use: No     Comment: occasional  . Drug Use: No  . Sexual Activity: No   Other Topics Concern  . Not on file   Social History Narrative   Lives with spouse    ROS: no fevers or chills, productive cough, hemoptysis, dysphasia, odynophagia, melena, hematochezia, dysuria, hematuria, rash, seizure activity, orthopnea, PND, pedal  edema, claudication. Remaining systems are negative.  Physical Exam: Well-developed well-nourished in no acute distress.  Skin is warm and dry.  HEENT is normal.  Neck is supple.  Chest is clear to auscultation with normal expansion.  Cardiovascular exam is irregular Abdominal exam nontender or distended. No masses palpated. Extremities show no edema. neuro grossly intact  ECG Atrial fibrillation at a rate of 69. Not rule out prior septal infarct.

## 2015-02-13 ENCOUNTER — Ambulatory Visit (INDEPENDENT_AMBULATORY_CARE_PROVIDER_SITE_OTHER): Payer: Medicare Other | Admitting: Cardiology

## 2015-02-13 ENCOUNTER — Encounter: Payer: Self-pay | Admitting: Cardiology

## 2015-02-13 VITALS — BP 150/90 | HR 69 | Ht 65.0 in | Wt 159.0 lb

## 2015-02-13 DIAGNOSIS — I482 Chronic atrial fibrillation: Secondary | ICD-10-CM

## 2015-02-13 DIAGNOSIS — E7800 Pure hypercholesterolemia, unspecified: Secondary | ICD-10-CM | POA: Diagnosis not present

## 2015-02-13 DIAGNOSIS — I4821 Permanent atrial fibrillation: Secondary | ICD-10-CM

## 2015-02-13 DIAGNOSIS — I10 Essential (primary) hypertension: Secondary | ICD-10-CM | POA: Diagnosis not present

## 2015-02-13 MED ORDER — DILTIAZEM HCL ER COATED BEADS 300 MG PO CP24: 300.0000 mg | ORAL_CAPSULE | Freq: Every day | ORAL | Status: DC

## 2015-02-13 NOTE — Assessment & Plan Note (Addendum)
Blood pressure elevated but patient states typically controlled. Continue present medications. We'll follow blood pressure at home. If it remains elevated we will increase Cozaar to 100 mg daily.

## 2015-02-13 NOTE — Patient Instructions (Signed)
Your physician wants you to follow-up in: ONE YEAR WITH DR CRENSHAW You will receive a reminder letter in the mail two months in advance. If you don't receive a letter, please call our office to schedule the follow-up appointment.   If you need a refill on your cardiac medications before your next appointment, please call your pharmacy.  

## 2015-02-13 NOTE — Assessment & Plan Note (Signed)
Patient remains in permanent atrial fibrillation. Continue Cardizem for rate control. Continue Coumadin. Not interested in [place].

## 2015-02-13 NOTE — Assessment & Plan Note (Signed)
Intolerant to statins. Continue diet. 

## 2015-02-14 ENCOUNTER — Ambulatory Visit (INDEPENDENT_AMBULATORY_CARE_PROVIDER_SITE_OTHER): Payer: Medicare Other | Admitting: *Deleted

## 2015-02-14 DIAGNOSIS — Z5181 Encounter for therapeutic drug level monitoring: Secondary | ICD-10-CM | POA: Diagnosis not present

## 2015-02-14 DIAGNOSIS — I482 Chronic atrial fibrillation: Secondary | ICD-10-CM

## 2015-02-14 DIAGNOSIS — I4891 Unspecified atrial fibrillation: Secondary | ICD-10-CM

## 2015-02-14 DIAGNOSIS — I4821 Permanent atrial fibrillation: Secondary | ICD-10-CM

## 2015-02-14 LAB — POCT INR: INR: 2.3

## 2015-02-25 ENCOUNTER — Other Ambulatory Visit: Payer: Self-pay | Admitting: Cardiology

## 2015-03-28 ENCOUNTER — Ambulatory Visit (INDEPENDENT_AMBULATORY_CARE_PROVIDER_SITE_OTHER): Payer: Medicare Other

## 2015-03-28 DIAGNOSIS — I482 Chronic atrial fibrillation: Secondary | ICD-10-CM | POA: Diagnosis not present

## 2015-03-28 DIAGNOSIS — Z5181 Encounter for therapeutic drug level monitoring: Secondary | ICD-10-CM | POA: Diagnosis not present

## 2015-03-28 DIAGNOSIS — I4891 Unspecified atrial fibrillation: Secondary | ICD-10-CM

## 2015-03-28 DIAGNOSIS — I4821 Permanent atrial fibrillation: Secondary | ICD-10-CM

## 2015-03-28 LAB — POCT INR: INR: 2.8

## 2015-05-09 ENCOUNTER — Ambulatory Visit (INDEPENDENT_AMBULATORY_CARE_PROVIDER_SITE_OTHER): Payer: Medicare Other

## 2015-05-09 DIAGNOSIS — I4891 Unspecified atrial fibrillation: Secondary | ICD-10-CM | POA: Diagnosis not present

## 2015-05-09 DIAGNOSIS — Z5181 Encounter for therapeutic drug level monitoring: Secondary | ICD-10-CM | POA: Diagnosis not present

## 2015-05-09 DIAGNOSIS — I482 Chronic atrial fibrillation: Secondary | ICD-10-CM | POA: Diagnosis not present

## 2015-05-09 DIAGNOSIS — I4821 Permanent atrial fibrillation: Secondary | ICD-10-CM

## 2015-05-09 LAB — POCT INR: INR: 3.2

## 2015-06-20 ENCOUNTER — Ambulatory Visit (INDEPENDENT_AMBULATORY_CARE_PROVIDER_SITE_OTHER): Payer: Medicare Other | Admitting: *Deleted

## 2015-06-20 DIAGNOSIS — Z5181 Encounter for therapeutic drug level monitoring: Secondary | ICD-10-CM

## 2015-06-20 DIAGNOSIS — I482 Chronic atrial fibrillation: Secondary | ICD-10-CM | POA: Diagnosis not present

## 2015-06-20 DIAGNOSIS — I4821 Permanent atrial fibrillation: Secondary | ICD-10-CM

## 2015-06-20 DIAGNOSIS — I4891 Unspecified atrial fibrillation: Secondary | ICD-10-CM | POA: Diagnosis not present

## 2015-06-20 LAB — POCT INR: INR: 2.5

## 2015-07-03 DIAGNOSIS — H903 Sensorineural hearing loss, bilateral: Secondary | ICD-10-CM | POA: Diagnosis not present

## 2015-07-20 DIAGNOSIS — H903 Sensorineural hearing loss, bilateral: Secondary | ICD-10-CM | POA: Diagnosis not present

## 2015-07-31 DIAGNOSIS — H401130 Primary open-angle glaucoma, bilateral, stage unspecified: Secondary | ICD-10-CM | POA: Diagnosis not present

## 2015-07-31 DIAGNOSIS — H2511 Age-related nuclear cataract, right eye: Secondary | ICD-10-CM | POA: Diagnosis not present

## 2015-07-31 DIAGNOSIS — H11151 Pinguecula, right eye: Secondary | ICD-10-CM | POA: Diagnosis not present

## 2015-07-31 DIAGNOSIS — H25011 Cortical age-related cataract, right eye: Secondary | ICD-10-CM | POA: Diagnosis not present

## 2015-08-01 ENCOUNTER — Ambulatory Visit (INDEPENDENT_AMBULATORY_CARE_PROVIDER_SITE_OTHER): Payer: Medicare Other | Admitting: *Deleted

## 2015-08-01 DIAGNOSIS — I482 Chronic atrial fibrillation: Secondary | ICD-10-CM

## 2015-08-01 DIAGNOSIS — I4891 Unspecified atrial fibrillation: Secondary | ICD-10-CM

## 2015-08-01 DIAGNOSIS — I4821 Permanent atrial fibrillation: Secondary | ICD-10-CM

## 2015-08-01 DIAGNOSIS — Z5181 Encounter for therapeutic drug level monitoring: Secondary | ICD-10-CM

## 2015-08-01 LAB — POCT INR: INR: 2.7

## 2015-08-21 DIAGNOSIS — H903 Sensorineural hearing loss, bilateral: Secondary | ICD-10-CM | POA: Diagnosis not present

## 2015-08-29 ENCOUNTER — Other Ambulatory Visit: Payer: Self-pay | Admitting: Pharmacist

## 2015-08-29 MED ORDER — WARFARIN SODIUM 5 MG PO TABS: ORAL_TABLET | ORAL | Status: DC

## 2015-09-11 ENCOUNTER — Ambulatory Visit (INDEPENDENT_AMBULATORY_CARE_PROVIDER_SITE_OTHER): Payer: Medicare Other | Admitting: Pharmacist

## 2015-09-11 DIAGNOSIS — Z5181 Encounter for therapeutic drug level monitoring: Secondary | ICD-10-CM | POA: Diagnosis not present

## 2015-09-11 DIAGNOSIS — I482 Chronic atrial fibrillation: Secondary | ICD-10-CM

## 2015-09-11 DIAGNOSIS — I4821 Permanent atrial fibrillation: Secondary | ICD-10-CM

## 2015-09-11 DIAGNOSIS — I4891 Unspecified atrial fibrillation: Secondary | ICD-10-CM

## 2015-09-11 LAB — POCT INR: INR: 2.2

## 2015-09-18 ENCOUNTER — Encounter: Payer: Medicare Other | Admitting: Internal Medicine

## 2015-09-22 ENCOUNTER — Encounter: Payer: Self-pay | Admitting: Internal Medicine

## 2015-09-22 ENCOUNTER — Other Ambulatory Visit (INDEPENDENT_AMBULATORY_CARE_PROVIDER_SITE_OTHER): Payer: Medicare Other

## 2015-09-22 ENCOUNTER — Ambulatory Visit (INDEPENDENT_AMBULATORY_CARE_PROVIDER_SITE_OTHER): Payer: Medicare Other | Admitting: Internal Medicine

## 2015-09-22 VITALS — BP 122/70 | HR 81 | Temp 98.1°F | Resp 16 | Ht 65.0 in | Wt 164.5 lb

## 2015-09-22 DIAGNOSIS — R7309 Other abnormal glucose: Secondary | ICD-10-CM

## 2015-09-22 DIAGNOSIS — R7989 Other specified abnormal findings of blood chemistry: Secondary | ICD-10-CM

## 2015-09-22 DIAGNOSIS — E038 Other specified hypothyroidism: Secondary | ICD-10-CM

## 2015-09-22 DIAGNOSIS — I2510 Atherosclerotic heart disease of native coronary artery without angina pectoris: Secondary | ICD-10-CM | POA: Diagnosis not present

## 2015-09-22 DIAGNOSIS — I10 Essential (primary) hypertension: Secondary | ICD-10-CM

## 2015-09-22 DIAGNOSIS — I480 Paroxysmal atrial fibrillation: Secondary | ICD-10-CM

## 2015-09-22 DIAGNOSIS — N402 Nodular prostate without lower urinary tract symptoms: Secondary | ICD-10-CM

## 2015-09-22 DIAGNOSIS — Z0000 Encounter for general adult medical examination without abnormal findings: Secondary | ICD-10-CM | POA: Diagnosis not present

## 2015-09-22 DIAGNOSIS — E785 Hyperlipidemia, unspecified: Secondary | ICD-10-CM

## 2015-09-22 DIAGNOSIS — R945 Abnormal results of liver function studies: Secondary | ICD-10-CM

## 2015-09-22 DIAGNOSIS — B356 Tinea cruris: Secondary | ICD-10-CM

## 2015-09-22 LAB — URINALYSIS, ROUTINE W REFLEX MICROSCOPIC
Bilirubin Urine: NEGATIVE
HGB URINE DIPSTICK: NEGATIVE
Ketones, ur: NEGATIVE
LEUKOCYTES UA: NEGATIVE
Nitrite: NEGATIVE
PH: 7.5 (ref 5.0–8.0)
Specific Gravity, Urine: 1.01 (ref 1.000–1.030)
UROBILINOGEN UA: 0.2 (ref 0.0–1.0)
Urine Glucose: NEGATIVE

## 2015-09-22 LAB — COMPREHENSIVE METABOLIC PANEL
ALBUMIN: 4.5 g/dL (ref 3.5–5.2)
ALK PHOS: 69 U/L (ref 39–117)
ALT: 20 U/L (ref 0–53)
AST: 19 U/L (ref 0–37)
BUN: 13 mg/dL (ref 6–23)
CO2: 26 mEq/L (ref 19–32)
Calcium: 9.8 mg/dL (ref 8.4–10.5)
Chloride: 105 mEq/L (ref 96–112)
Creatinine, Ser: 1.19 mg/dL (ref 0.40–1.50)
GFR: 77.39 mL/min (ref >60.00)
Glucose, Bld: 106 mg/dL — ABNORMAL HIGH (ref 70–99)
POTASSIUM: 4.1 meq/L (ref 3.5–5.1)
SODIUM: 140 meq/L (ref 135–145)
TOTAL PROTEIN: 7.1 g/dL (ref 6.0–8.3)
Total Bilirubin: 0.8 mg/dL (ref 0.2–1.2)

## 2015-09-22 LAB — TSH: TSH: 3.75 u[IU]/mL (ref 0.35–4.50)

## 2015-09-22 LAB — CBC WITH DIFFERENTIAL/PLATELET
Basophils Absolute: 0 10*3/uL (ref 0.0–0.1)
Basophils Relative: 0.4 % (ref 0.0–3.0)
EOS ABS: 0.2 10*3/uL (ref 0.0–0.7)
EOS PCT: 1.9 % (ref 0.0–5.0)
HCT: 45.5 % (ref 39.0–52.0)
HEMOGLOBIN: 15.4 g/dL (ref 13.0–17.0)
Lymphocytes Relative: 29 % (ref 12.0–46.0)
Lymphs Abs: 2.7 10*3/uL (ref 0.7–4.0)
MCHC: 33.9 g/dL (ref 30.0–36.0)
MCV: 95.3 fl (ref 78.0–100.0)
MONO ABS: 1.2 10*3/uL — AB (ref 0.1–1.0)
Monocytes Relative: 12.8 % — ABNORMAL HIGH (ref 3.0–12.0)
Neutro Abs: 5.3 10*3/uL (ref 1.4–7.7)
Neutrophils Relative %: 55.9 % (ref 43.0–77.0)
Platelets: 191 10*3/uL (ref 150.0–400.0)
RBC: 4.78 Mil/uL (ref 4.22–5.81)
RDW: 12.8 % (ref 11.5–15.5)
WBC: 9.4 10*3/uL (ref 4.0–10.5)

## 2015-09-22 LAB — HEMOGLOBIN A1C: Hgb A1c MFr Bld: 5.1 % (ref 4.6–6.5)

## 2015-09-22 LAB — LIPID PANEL
Cholesterol: 186 mg/dL (ref 0–200)
HDL: 31.4 mg/dL — ABNORMAL LOW (ref >39.00)
LDL Cholesterol: 118 mg/dL — ABNORMAL HIGH (ref 0–99)
NonHDL: 155.07
Total CHOL/HDL Ratio: 6
Triglycerides: 184 mg/dL — ABNORMAL HIGH (ref 0.0–149.0)
VLDL: 36.8 mg/dL (ref 0.0–40.0)

## 2015-09-22 LAB — PSA: PSA: 1.6 ng/mL (ref 0.10–4.00)

## 2015-09-22 LAB — FECAL OCCULT BLOOD, GUAIAC: Fecal Occult Blood: NEGATIVE

## 2015-09-22 MED ORDER — TAMSULOSIN HCL 0.4 MG PO CAPS: 0.4000 mg | ORAL_CAPSULE | Freq: Every day | ORAL | Status: DC

## 2015-09-22 MED ORDER — CLOTRIMAZOLE-BETAMETHASONE 1-0.05 % EX CREA: 1.0000 "application " | TOPICAL_CREAM | Freq: Two times a day (BID) | CUTANEOUS | Status: DC

## 2015-09-22 NOTE — Progress Notes (Signed)
Pre visit review using our clinic review tool, if applicable. No additional management support is needed unless otherwise documented below in the visit note. 

## 2015-09-22 NOTE — Patient Instructions (Signed)

## 2015-09-22 NOTE — Progress Notes (Signed)
Subjective:  Patient ID: [name], male    DOB: [birth year]  Age: 70 y.o. MRN: [id number]  CC: Annual Exam; Hyperlipidemia; Hypothyroidism; and Hypertension   HPI [name] presents for a CPX.  He tells me his blood pressure is well-controlled on the combination of diltiazem and losartan. He has had no recent episodes of chest pain, shortness of breath, palpitations, DOE, edema, fatigue, or near-syncope. He saw his cardiologist about 6 months ago and it is determined that he is in permanent A. fib and will remain anticoagulated with Coumadin.    Past Medical History  Diagnosis Date  . Atrial fibrillation ([place])   . Hyperlipidemia   . Anterior myocardial infarction [facility])     Presumed secondary to embolus from atrial fibrillation  . Hypertension   . Arthritis   . Hard of hearing   . Hemorrhoid   . Hx of adenomatous colonic polyps 07/2008   Past Surgical History  Procedure Laterality Date  . Stapedes surgery      reports that he has quit smoking. He has never used smokeless tobacco. He reports that he does not drink alcohol or use illicit drugs. family history includes Heart disease in his mother. There is no history of Cancer, Stroke, Hypertension, Hyperlipidemia, or Diabetes. Allergies  Allergen Reactions  . Statins   . Lipitor [Atorvastatin Calcium]     Muscle aches  . Lovastatin     Muscle aches    Outpatient Prescriptions Prior to Visit  Medication Sig Dispense Refill  . Calcium Carbonate-Vitamin D (CALCIUM-VITAMIN D) 600-200 MG-UNIT CAPS Take 1 tablet by mouth 2 (two) times daily.     Marland Kitchen diltiazem (CARTIA XT) 300 MG 24 hr capsule Take 1 capsule (300 mg total) by mouth daily. 90 capsule 4  . losartan (COZAAR) 50 MG tablet Take 1 tablet (50 mg total) by mouth daily. 90 tablet 3  . Magnesium 300 MG CAPS Take 1 capsule by mouth daily.      . vitamin E 400 UNIT capsule Take 400 Units by mouth daily.      Marland Kitchen warfarin (COUMADIN) 5 MG tablet TAKE 1 AND 1/2 TABLETS  BY MOUTH DAILY OR AS DIRECTED BY COUMADIN CLINIC 135 tablet 1  . Ascorbic Acid (VITAMIN C) 500 MG tablet Take 500 mg by mouth daily.      . Ciclopirox 0.77 % gel Apply 1 Act topically 2 (two) times daily. 45 g 3  . Garlic Oil (SUPER GARLIC) 123XX123 MG CAPS Take 2 capsules by mouth daily.      . Multiple Vitamin (MULTIVITAMIN) tablet Take 1 tablet by mouth daily.      . tamsulosin (FLOMAX) 0.4 MG CAPS capsule Take 1 capsule (0.4 mg total) by mouth daily. 90 capsule 3   No facility-administered medications prior to visit.    ROS Review of Systems  Constitutional: Negative.  Negative for diaphoresis and fatigue.  HENT: Negative.   Eyes: Negative.  Negative for visual disturbance.  Respiratory: Negative.  Negative for cough, choking, chest tightness, shortness of breath, wheezing and stridor.   Cardiovascular: Negative.  Negative for chest pain, palpitations and leg swelling.  Gastrointestinal: Negative.  Negative for nausea, vomiting, abdominal pain, diarrhea and constipation.  Endocrine: Negative.   Genitourinary: Negative.  Negative for dysuria and difficulty urinating.  Musculoskeletal: Negative.  Negative for myalgias, back pain, arthralgias and neck pain.  Skin: Negative.  Negative for color change and rash.  Allergic/Immunologic: Negative.   Neurological: Negative.  Negative for dizziness and  weakness.  Hematological: Negative.  Negative for adenopathy. Does not bruise/bleed easily.  Psychiatric/Behavioral: Negative.     Objective:  BP 122/70 mmHg  Pulse 81  Temp(Src) 98.1 F (36.7 C) (Oral)  Resp 16  Ht 5\' 5"  (1.651 m)  Wt 164 lb 8 oz (74.617 kg)  BMI 27.37 kg/m2  SpO2 98%  BP Readings from Last 3 Encounters:  09/22/15 122/70  02/13/15 150/90  09/15/14 136/86    Wt Readings from Last 3 Encounters:  09/22/15 164 lb 8 oz (74.617 kg)  02/13/15 159 lb (72.122 kg)  09/15/14 159 lb 4 oz (72.235 kg)    Physical Exam  Constitutional: He is oriented to person, place, and  time. No distress.  HENT:  Head: Normocephalic and atraumatic.  Mouth/Throat: Oropharynx is clear and moist. No oropharyngeal exudate.  Eyes: Conjunctivae are normal. Right eye exhibits no discharge. Left eye exhibits no discharge. No scleral icterus.  Neck: Normal range of motion. Neck supple. No JVD present. No tracheal deviation present. No thyromegaly present.  Cardiovascular: Normal rate, normal heart sounds and intact distal pulses.  An irregularly irregular rhythm present. Exam reveals no gallop and no friction rub.   No murmur heard. Pulses:      Carotid pulses are 1+ on the right side, and 1+ on the left side.      Radial pulses are 1+ on the right side, and 1+ on the left side.       Femoral pulses are 1+ on the right side, and 1+ on the left side.      Popliteal pulses are 1+ on the right side, and 1+ on the left side.       Dorsalis pedis pulses are 1+ on the right side, and 1+ on the left side.       Posterior tibial pulses are 1+ on the right side, and 1+ on the left side.  Pulmonary/Chest: Effort normal and breath sounds normal. No stridor. No respiratory distress. He has no wheezes. He has no rales. He exhibits no tenderness.  Abdominal: Soft. Bowel sounds are normal. He exhibits no distension and no mass. There is no tenderness. There is no rebound and no guarding. Hernia confirmed negative in the right inguinal area and confirmed negative in the left inguinal area.  Genitourinary: Rectum normal, testes normal and penis normal. Rectal exam shows no external hemorrhoid, no internal hemorrhoid, no fissure, no mass, no tenderness and anal tone normal. Guaiac negative stool. Prostate is enlarged (1+ smooth symm BPH). Prostate is not tender. Right testis shows no mass, no swelling and no tenderness. Right testis is descended. Left testis shows no mass, no swelling and no tenderness. Left testis is descended. Circumcised. No hypospadias or penile tenderness. No discharge found.    Musculoskeletal: Normal range of motion. He exhibits no edema or tenderness.  Lymphadenopathy:    He has no cervical adenopathy.       Right: No inguinal adenopathy present.       Left: No inguinal adenopathy present.  Neurological: He is oriented to person, place, and time.  Skin: Skin is warm and dry. No rash noted. He is not diaphoretic. No erythema. No pallor.  Vitals reviewed.   Lab Results  Component Value Date   WBC 9.4 09/22/2015   HGB 15.4 09/22/2015   HCT 45.5 09/22/2015   PLT 191.0 09/22/2015   GLUCOSE 106* 09/22/2015   CHOL 186 09/22/2015   TRIG 184.0* 09/22/2015   HDL 31.40* 09/22/2015   LDLDIRECT 106.4  08/02/2010   LDLCALC 118* 09/22/2015   ALT 20 09/22/2015   AST 19 09/22/2015   NA 140 09/22/2015   K 4.1 09/22/2015   CL 105 09/22/2015   CREATININE 1.19 09/22/2015   BUN 13 09/22/2015   CO2 26 09/22/2015   TSH 3.75 09/22/2015   PSA 1.60 09/22/2015   INR 2.2 09/11/2015   HGBA1C 5.1 09/22/2015    No results found.  Assessment & Plan:   Devlin was seen today for annual exam, hyperlipidemia, hypothyroidism and hypertension.  Diagnoses and all orders for this visit:  Prostate nodule without urinary obstruction- His symptoms are adequately well controlled with Flomax, his PSA is low so I am not concerned about prostate cancer -     tamsulosin (FLOMAX) 0.4 MG CAPS capsule; Take 1 capsule (0.4 mg total) by mouth daily. -     Urinalysis, Routine w reflex microscopic (not at [facility]); Future -     PSA; Future  Essential hypertension- his blood pressure is well controlled, electrolytes and renal function are stable. -     Comprehensive metabolic panel; Future -     CBC with Differential/Platelet; Future  Paroxysmal atrial fibrillation ([place])- he has good rate control, will continue anticoagulation  Other specified hypothyroidism- his TSH is in the normal range -     TSH; Future  Hyperlipidemia with target LDL less than 70- he has not achieved his LDL goal  however he also tells me he will not consider starting a statin.  Tinea cruris -     clotrimazole-betamethasone (LOTRISONE) cream; Apply 1 application topically 2 (two) times daily.  Coronary artery disease involving native coronary artery of native heart without angina pectoris- he is had no recent symptoms of angina, he will not start taking a statin, will continue aspirin therapy and blood pressure control. -     Lipid panel; Future  Other abnormal glucose- improvement noted -     Comprehensive metabolic panel; Future -     Hemoglobin A1c; Future  Elevated LFTs- he is negative for hepatitis C infection and his liver enzymes are normal now, will continue to follow this. -     Hepatitis C antibody; Future  Routine general medical examination at a health care facility   I have discontinued Mr. Fekete multivitamin, Garlic Oil, vitamin C, and Ciclopirox. I am also having him start on clotrimazole-betamethasone. Additionally, I am having him maintain his Calcium-Vitamin D, Magnesium, vitamin E, losartan, diltiazem, warfarin, dorzolamide-timolol, and tamsulosin.  Meds ordered this encounter  Medications  . dorzolamide-timolol (COSOPT) 22.3-6.8 MG/ML ophthalmic solution    Sig:     Refill:  1  . clotrimazole-betamethasone (LOTRISONE) cream    Sig: Apply 1 application topically 2 (two) times daily.    Dispense:  30 g    Refill:  0  . tamsulosin (FLOMAX) 0.4 MG CAPS capsule    Sig: Take 1 capsule (0.4 mg total) by mouth daily.    Dispense:  90 capsule    Refill:  3   See AVS for instructions about healthy living and anticipatory guidance.  Follow-up: Return in about 6 months (around 03/24/2016).  Scarlette Calico, MD

## 2015-09-23 ENCOUNTER — Encounter: Payer: Self-pay | Admitting: Internal Medicine

## 2015-09-23 LAB — HEPATITIS C ANTIBODY: HCV AB: NEGATIVE

## 2015-09-23 NOTE — Assessment & Plan Note (Signed)

## 2015-09-25 DIAGNOSIS — H903 Sensorineural hearing loss, bilateral: Secondary | ICD-10-CM | POA: Diagnosis not present

## 2015-10-24 ENCOUNTER — Ambulatory Visit (INDEPENDENT_AMBULATORY_CARE_PROVIDER_SITE_OTHER): Payer: Medicare Other | Admitting: Pharmacist

## 2015-10-24 DIAGNOSIS — I4891 Unspecified atrial fibrillation: Secondary | ICD-10-CM | POA: Diagnosis not present

## 2015-10-24 DIAGNOSIS — Z5181 Encounter for therapeutic drug level monitoring: Secondary | ICD-10-CM | POA: Diagnosis not present

## 2015-10-24 LAB — POCT INR: INR: 2.7

## 2015-10-30 DIAGNOSIS — H903 Sensorineural hearing loss, bilateral: Secondary | ICD-10-CM | POA: Diagnosis not present

## 2015-12-02 ENCOUNTER — Other Ambulatory Visit: Payer: Self-pay | Admitting: Internal Medicine

## 2015-12-02 DIAGNOSIS — I10 Essential (primary) hypertension: Secondary | ICD-10-CM

## 2015-12-05 ENCOUNTER — Ambulatory Visit (INDEPENDENT_AMBULATORY_CARE_PROVIDER_SITE_OTHER): Payer: Medicare Other | Admitting: Pharmacist

## 2015-12-05 DIAGNOSIS — I4891 Unspecified atrial fibrillation: Secondary | ICD-10-CM

## 2015-12-05 DIAGNOSIS — Z5181 Encounter for therapeutic drug level monitoring: Secondary | ICD-10-CM

## 2015-12-05 LAB — POCT INR: INR: 2.6

## 2016-01-16 ENCOUNTER — Ambulatory Visit (INDEPENDENT_AMBULATORY_CARE_PROVIDER_SITE_OTHER): Payer: Medicare Other

## 2016-01-16 DIAGNOSIS — Z5181 Encounter for therapeutic drug level monitoring: Secondary | ICD-10-CM

## 2016-01-16 DIAGNOSIS — I4891 Unspecified atrial fibrillation: Secondary | ICD-10-CM

## 2016-01-16 LAB — POCT INR: INR: 2.9

## 2016-01-26 DIAGNOSIS — H903 Sensorineural hearing loss, bilateral: Secondary | ICD-10-CM | POA: Diagnosis not present

## 2016-01-31 NOTE — Progress Notes (Signed)
HPI: FU Atrial fibrillation; history of an acute anterior myocardial infarction occurring in the setting of atrial fibrillation, felt secondary to an embolic event. His LV function is preserved. We have been treating with rate control and anticoagulation at his request. His previous cardiac catheterization in August of 2009 revealed an occluded LAD but no other obstructive disease. The LAD occlusion was felt secondary to an embolus and resolved with thrombus aspiration. Abdominal ultrasound in October 2014 showed no aneurysm. Echocardiogram repeated in November 2014 and showed normal LV function, mild left ventricular hypertrophy and moderate left atrial enlargement. Carotid Dopplers in Nov 2015 showed no significant stenosis. Since I last saw him, the patient denies any dyspnea on exertion, orthopnea, PND, pedal edema, palpitations, syncope or chest pain.   Current Outpatient Prescriptions  Medication Sig Dispense Refill  . Calcium Carbonate-Vitamin D (CALCIUM-VITAMIN D) 600-200 MG-UNIT CAPS Take 1 tablet by mouth 2 (two) times daily.     . clotrimazole-betamethasone (LOTRISONE) cream Apply 1 application topically 2 (two) times daily. 30 g 0  . diltiazem (CARTIA XT) 300 MG 24 hr capsule Take 1 capsule (300 mg total) by mouth daily. 90 capsule 4  . dorzolamide-timolol (COSOPT) 22.3-6.8 MG/ML ophthalmic solution   1  . losartan (COZAAR) 50 MG tablet take 1 tablet by mouth once daily 90 tablet 3  . Magnesium 300 MG CAPS Take 1 capsule by mouth daily.      . tamsulosin (FLOMAX) 0.4 MG CAPS capsule Take 1 capsule (0.4 mg total) by mouth daily. 90 capsule 3  . vitamin E 400 UNIT capsule Take 400 Units by mouth daily.      Marland Kitchen warfarin (COUMADIN) 5 MG tablet TAKE 1 AND 1/2 TABLETS BY MOUTH DAILY OR AS DIRECTED BY COUMADIN CLINIC 135 tablet 1   No current facility-administered medications for this visit.      Past Medical History:  Diagnosis Date  . Anterior myocardial infarction [facility])    Presumed secondary to embolus from atrial fibrillation  . Arthritis   . Atrial fibrillation ([place])   . Hard of hearing   . Hemorrhoid   . Hx of adenomatous colonic polyps 07/2008  . Hyperlipidemia   . Hypertension     Past Surgical History:  Procedure Laterality Date  . STAPEDES SURGERY      Social History   Social History  . Marital status: Married    Spouse name: N/A  . Number of children: 6  . Years of education: N/A   Occupational History  . Retired   .  Retired   Social History Main Topics  . Smoking status: Former Research scientist (life sciences)  . Smokeless tobacco: Never Used     Comment: quit x 35 years ago  . Alcohol use No     Comment: occasional  . Drug use: No  . Sexual activity: No   Other Topics Concern  . Not on file   Social History Narrative   Lives with spouse    Family History  Problem Relation Age of Onset  . Heart disease Mother   . Cancer Neg Hx   . Stroke Neg Hx   . Hypertension Neg Hx   . Hyperlipidemia Neg Hx   . Diabetes Neg Hx     ROS: no fevers or chills, productive cough, hemoptysis, dysphasia, odynophagia, melena, hematochezia, dysuria, hematuria, rash, seizure activity, orthopnea, PND, pedal edema, claudication. Remaining systems are negative.  Physical Exam: Well-developed well-nourished in no acute distress.  Skin is warm and dry.  HEENT is normal.  Neck is supple.  Chest is clear to auscultation with normal expansion.  Cardiovascular exam is irregular Abdominal exam nontender or distended. No masses palpated. Extremities show no edema. neuro grossly intact  ECG-Atrial fibrillation at a rate of 67. No ST changes.  A/P  1 permanent atrial fibrillation-continue Cardizem for rate control. Continue Coumadin. Not interested in [place].  2 hypertension -blood pressure elevated. Increase Cozaar to 100 mg daily. Check potassium and renal function in 1 week. Follow blood pressure and adjust regimen as needed.  3 hyperlipidemia-continue diet.  Intolerant to statins.  Kirk Ruths, MD

## 2016-02-05 ENCOUNTER — Encounter: Payer: Self-pay | Admitting: Cardiology

## 2016-02-12 ENCOUNTER — Encounter: Payer: Self-pay | Admitting: Cardiology

## 2016-02-12 ENCOUNTER — Ambulatory Visit (INDEPENDENT_AMBULATORY_CARE_PROVIDER_SITE_OTHER): Payer: Medicare Other | Admitting: Cardiology

## 2016-02-12 VITALS — BP 158/60 | HR 67 | Ht 65.0 in | Wt 169.0 lb

## 2016-02-12 DIAGNOSIS — I2510 Atherosclerotic heart disease of native coronary artery without angina pectoris: Secondary | ICD-10-CM

## 2016-02-12 DIAGNOSIS — I482 Chronic atrial fibrillation: Secondary | ICD-10-CM

## 2016-02-12 DIAGNOSIS — I10 Essential (primary) hypertension: Secondary | ICD-10-CM

## 2016-02-12 DIAGNOSIS — I4821 Permanent atrial fibrillation: Secondary | ICD-10-CM

## 2016-02-12 MED ORDER — LOSARTAN POTASSIUM 100 MG PO TABS: 100.0000 mg | ORAL_TABLET | Freq: Every day | ORAL | 3 refills | Status: DC

## 2016-02-12 NOTE — Patient Instructions (Signed)
Medication Instructions:   INCREASE LOSARTAN TO 100 MG ONCE DAILY= 2 OF THE 50 MG TABLETS ONCE DAILY  Labwork:  Your physician recommends that you return for lab work in: [place]:  Your physician wants you to follow-up in: [place] will receive a reminder letter in the mail two months in advance. If you don't receive a letter, please call our office to schedule the follow-up appointment.   If you need a refill on your cardiac medications before your next appointment, please call your pharmacy.

## 2016-02-19 DIAGNOSIS — I10 Essential (primary) hypertension: Secondary | ICD-10-CM | POA: Diagnosis not present

## 2016-02-20 DIAGNOSIS — H401113 Primary open-angle glaucoma, right eye, severe stage: Secondary | ICD-10-CM | POA: Diagnosis not present

## 2016-02-20 DIAGNOSIS — H401122 Primary open-angle glaucoma, left eye, moderate stage: Secondary | ICD-10-CM | POA: Diagnosis not present

## 2016-02-20 LAB — BASIC METABOLIC PANEL
BUN: 16 mg/dL (ref 7–25)
CHLORIDE: 104 mmol/L (ref 98–110)
CO2: 29 mmol/L (ref 20–31)
Calcium: 9.6 mg/dL (ref 8.6–10.3)
Creat: 1.22 mg/dL — ABNORMAL HIGH (ref 0.70–1.18)
Glucose, Bld: 102 mg/dL — ABNORMAL HIGH (ref 65–99)
POTASSIUM: 4.1 mmol/L (ref 3.5–5.3)
Sodium: 140 mmol/L (ref 135–146)

## 2016-02-27 ENCOUNTER — Ambulatory Visit (INDEPENDENT_AMBULATORY_CARE_PROVIDER_SITE_OTHER): Payer: Medicare Other | Admitting: *Deleted

## 2016-02-27 DIAGNOSIS — I4891 Unspecified atrial fibrillation: Secondary | ICD-10-CM

## 2016-02-27 DIAGNOSIS — Z5181 Encounter for therapeutic drug level monitoring: Secondary | ICD-10-CM | POA: Diagnosis not present

## 2016-02-27 DIAGNOSIS — I2510 Atherosclerotic heart disease of native coronary artery without angina pectoris: Secondary | ICD-10-CM

## 2016-02-27 LAB — POCT INR: INR: 3.4

## 2016-02-27 MED ORDER — WARFARIN SODIUM 5 MG PO TABS: ORAL_TABLET | ORAL | 0 refills | Status: DC

## 2016-03-25 ENCOUNTER — Ambulatory Visit (INDEPENDENT_AMBULATORY_CARE_PROVIDER_SITE_OTHER): Payer: Medicare Other | Admitting: Internal Medicine

## 2016-03-25 ENCOUNTER — Encounter: Payer: Self-pay | Admitting: Internal Medicine

## 2016-03-25 ENCOUNTER — Other Ambulatory Visit (INDEPENDENT_AMBULATORY_CARE_PROVIDER_SITE_OTHER): Payer: Medicare Other

## 2016-03-25 VITALS — BP 142/88 | HR 69 | Temp 97.9°F | Resp 16 | Ht 65.0 in | Wt 170.0 lb

## 2016-03-25 DIAGNOSIS — E038 Other specified hypothyroidism: Secondary | ICD-10-CM

## 2016-03-25 DIAGNOSIS — I10 Essential (primary) hypertension: Secondary | ICD-10-CM | POA: Diagnosis not present

## 2016-03-25 LAB — URINALYSIS, ROUTINE W REFLEX MICROSCOPIC
Bilirubin Urine: NEGATIVE
Hgb urine dipstick: NEGATIVE
KETONES UR: NEGATIVE
Leukocytes, UA: NEGATIVE
Nitrite: NEGATIVE
RBC / HPF: NONE SEEN (ref 0–?)
Specific Gravity, Urine: 1.01 (ref 1.000–1.030)
Total Protein, Urine: NEGATIVE
UROBILINOGEN UA: 0.2 (ref 0.0–1.0)
Urine Glucose: NEGATIVE
pH: 7.5 (ref 5.0–8.0)

## 2016-03-25 LAB — BASIC METABOLIC PANEL
BUN: 19 mg/dL (ref 6–23)
CALCIUM: 10 mg/dL (ref 8.4–10.5)
CO2: 29 mEq/L (ref 19–32)
CREATININE: 1.4 mg/dL (ref 0.40–1.50)
Chloride: 106 mEq/L (ref 96–112)
GFR: 64.07 mL/min (ref >60.00)
Glucose, Bld: 114 mg/dL — ABNORMAL HIGH (ref 70–99)
Potassium: 3.7 mEq/L (ref 3.5–5.1)
SODIUM: 140 meq/L (ref 135–145)

## 2016-03-25 NOTE — Progress Notes (Signed)
Pre visit review using our clinic review tool, if applicable. No additional management support is needed unless otherwise documented below in the visit note. 

## 2016-03-25 NOTE — Patient Instructions (Signed)
Hypertension Hypertension, commonly called high blood pressure, is when the force of blood pumping through your arteries is too strong. Your arteries are the blood vessels that carry blood from your heart throughout your body. A blood pressure reading consists of a higher number over a lower number, such as 110/72. The higher number (systolic) is the pressure inside your arteries when your heart pumps. The lower number (diastolic) is the pressure inside your arteries when your heart relaxes. Ideally you want your blood pressure below 120/80. Hypertension forces your heart to work harder to pump blood. Your arteries may become narrow or stiff. Having untreated or uncontrolled hypertension can cause heart attack, stroke, kidney disease, and other problems. What increases the risk? Some risk factors for high blood pressure are controllable. Others are not. Risk factors you cannot control include:  Race. You may be at higher risk if you are African American.  Age. Risk increases with age.  Gender. Men are at higher risk than women before age 45 years. After age 65, women are at higher risk than men. Risk factors you can control include:  Not getting enough exercise or physical activity.  Being overweight.  Getting too much fat, sugar, calories, or salt in your diet.  Drinking too much alcohol. What are the signs or symptoms? Hypertension does not usually cause signs or symptoms. Extremely high blood pressure (hypertensive crisis) may cause headache, anxiety, shortness of breath, and nosebleed. How is this diagnosed? To check if you have hypertension, your health care provider will measure your blood pressure while you are seated, with your arm held at the level of your heart. It should be measured at least twice using the same arm. Certain conditions can cause a difference in blood pressure between your right and left arms. A blood pressure reading that is higher than normal on one occasion does  not mean that you need treatment. If it is not clear whether you have high blood pressure, you may be asked to return on a different day to have your blood pressure checked again. Or, you may be asked to monitor your blood pressure at home for 1 or more weeks. How is this treated? Treating high blood pressure includes making lifestyle changes and possibly taking medicine. Living a healthy lifestyle can help lower high blood pressure. You may need to change some of your habits. Lifestyle changes may include:  Following the DASH diet. This diet is high in fruits, vegetables, and whole grains. It is low in salt, red meat, and added sugars.  Keep your sodium intake below 2,300 mg per day.  Getting at least 30-45 minutes of aerobic exercise at least 4 times per week.  Losing weight if necessary.  Not smoking.  Limiting alcoholic beverages.  Learning ways to reduce stress. Your health care provider may prescribe medicine if lifestyle changes are not enough to get your blood pressure under control, and if one of the following is true:  You are 18-59 years of age and your systolic blood pressure is above 140.  You are 60 years of age or older, and your systolic blood pressure is above 150.  Your diastolic blood pressure is above 90.  You have diabetes, and your systolic blood pressure is over 140 or your diastolic blood pressure is over 90.  You have kidney disease and your blood pressure is above 140/90.  You have heart disease and your blood pressure is above 140/90. Your personal target blood pressure may vary depending on your medical   conditions, your age, and other factors. Follow these instructions at home:  Have your blood pressure rechecked as directed by your health care provider.  Take medicines only as directed by your health care provider. Follow the directions carefully. Blood pressure medicines must be taken as prescribed. The medicine does not work as well when you skip  doses. Skipping doses also puts you at risk for problems.  Do not smoke.  Monitor your blood pressure at home as directed by your health care provider. Contact a health care provider if:  You think you are having a reaction to medicines taken.  You have recurrent headaches or feel dizzy.  You have swelling in your ankles.  You have trouble with your vision. Get help right away if:  You develop a severe headache or confusion.  You have unusual weakness, numbness, or feel faint.  You have severe chest or abdominal pain.  You vomit repeatedly.  You have trouble breathing. This information is not intended to replace advice given to you by your health care provider. Make sure you discuss any questions you have with your health care provider. Document Released: 02/25/2005 Document Revised: 08/03/2015 Document Reviewed: 12/18/2012 Elsevier Interactive Patient Education  2017 Elsevier Inc.  

## 2016-03-25 NOTE — Progress Notes (Signed)
Subjective:  Patient ID: [name], male    DOB: March 25, 1945  Age: 71 y.o. MRN: [id number]  CC: Hypertension and Hypothyroidism   HPI [name] presents for follow-up on the above medical problems.  He has a history of hypothyroidism but currently does not take a supplement. He denies any issues with weight gain and denies fatigue, palpitations, edema, or constipation.  He tells me his blood pressures well controlled and he has had no episodes of headache/blurred vision/chest pain/shortness of breath/DOE.  Outpatient Medications Prior to Visit  Medication Sig Dispense Refill  . Calcium Carbonate-Vitamin D (CALCIUM-VITAMIN D) 600-200 MG-UNIT CAPS Take 1 tablet by mouth 2 (two) times daily.     . clotrimazole-betamethasone (LOTRISONE) cream Apply 1 application topically 2 (two) times daily. 30 g 0  . diltiazem (CARTIA XT) 300 MG 24 hr capsule Take 1 capsule (300 mg total) by mouth daily. 90 capsule 4  . dorzolamide-timolol (COSOPT) 22.3-6.8 MG/ML ophthalmic solution   1  . losartan (COZAAR) 100 MG tablet Take 1 tablet (100 mg total) by mouth daily. 90 tablet 3  . Magnesium 300 MG CAPS Take 1 capsule by mouth daily.      . tamsulosin (FLOMAX) 0.4 MG CAPS capsule Take 1 capsule (0.4 mg total) by mouth daily. 90 capsule 3  . vitamin E 400 UNIT capsule Take 400 Units by mouth daily.      Marland Kitchen warfarin (COUMADIN) 5 MG tablet Take as directed by coumadin clinic 135 tablet 0   No facility-administered medications prior to visit.     ROS Review of Systems  Constitutional: Negative for activity change, appetite change, diaphoresis, fatigue and unexpected weight change.  HENT: Negative.   Eyes: Negative for visual disturbance.  Respiratory: Negative for apnea, chest tightness, shortness of breath and wheezing.   Cardiovascular: Negative for chest pain, palpitations and leg swelling.  Gastrointestinal: Negative for abdominal pain, constipation, diarrhea, nausea and vomiting.    Endocrine: Negative for cold intolerance and heat intolerance.  Genitourinary: Negative.  Negative for difficulty urinating, dysuria, flank pain, frequency, hematuria and urgency.  Musculoskeletal: Negative for back pain, myalgias and neck pain.  Skin: Negative.   Allergic/Immunologic: Negative.   Neurological: Negative.  Negative for dizziness, weakness, numbness and headaches.  Hematological: Negative for adenopathy. Does not bruise/bleed easily.  Psychiatric/Behavioral: Negative.     Objective:  BP (!) 142/88 (BP Location: Left Arm, Patient Position: Sitting, Cuff Size: Normal)   Pulse 69   Temp 97.9 F (36.6 C) (Oral)   Resp 16   Ht 5\' 5"  (1.651 m)   Wt 170 lb (77.1 kg)   SpO2 95%   BMI 28.29 kg/m   BP Readings from Last 3 Encounters:  03/25/16 (!) 142/88  02/12/16 (!) 158/60  09/22/15 122/70    Wt Readings from Last 3 Encounters:  03/25/16 170 lb (77.1 kg)  02/12/16 169 lb (76.7 kg)  09/22/15 164 lb 8 oz (74.6 kg)    Physical Exam  Constitutional: He is oriented to person, place, and time. No distress.  HENT:  Mouth/Throat: Oropharynx is clear and moist. No oropharyngeal exudate.  Eyes: Conjunctivae are normal. Right eye exhibits no discharge. Left eye exhibits no discharge. No scleral icterus.  Neck: Normal range of motion. Neck supple. No JVD present. No tracheal deviation present. No thyromegaly present.  Cardiovascular: Normal rate, S1 normal, S2 normal, normal heart sounds and intact distal pulses.  An irregularly irregular rhythm present. Exam reveals no gallop and no friction rub.  No murmur heard. Pulmonary/Chest: Effort normal and breath sounds normal. No stridor. No respiratory distress. He has no wheezes. He has no rales. He exhibits no tenderness.  Abdominal: Soft. Bowel sounds are normal. He exhibits no distension and no mass. There is no tenderness. There is no rebound and no guarding.  Musculoskeletal: Normal range of motion. He exhibits no edema,  tenderness or deformity.  Lymphadenopathy:    He has no cervical adenopathy.  Neurological: He is oriented to person, place, and time.  Skin: Skin is warm and dry. No rash noted. He is not diaphoretic. No erythema. No pallor.  Vitals reviewed.   Lab Results  Component Value Date   WBC 9.4 09/22/2015   HGB 15.4 09/22/2015   HCT 45.5 09/22/2015   PLT 191.0 09/22/2015   GLUCOSE 114 (H) 03/25/2016   CHOL 186 09/22/2015   TRIG 184.0 (H) 09/22/2015   HDL 31.40 (L) 09/22/2015   LDLDIRECT 106.4 08/02/2010   LDLCALC 118 (H) 09/22/2015   ALT 20 09/22/2015   AST 19 09/22/2015   NA 140 03/25/2016   K 3.7 03/25/2016   CL 106 03/25/2016   CREATININE 1.40 03/25/2016   BUN 19 03/25/2016   CO2 29 03/25/2016   TSH 5.55 (H) 03/25/2016   PSA 1.60 09/22/2015   INR 3.7 03/26/2016   HGBA1C 5.1 09/22/2015    No results found.  Assessment & Plan:   [name] was seen today for hypertension and hypothyroidism.  Diagnoses and all orders for this visit:  Other specified hypothyroidism- His TSH is slightly elevated at 5.55 but his other TFTs are normal and he is asymptomatic so at this time I do not recommend thyroid replacement therapy -     Thyroid Panel With TSH; Future  Essential hypertension- his blood pressure is well-controlled, electrolytes and renal function are normal. -     Basic metabolic panel; Future -     Urinalysis, Routine w reflex microscopic; Future   I am having [name] maintain his Calcium-Vitamin D, Magnesium, vitamin E, diltiazem, dorzolamide-timolol, clotrimazole-betamethasone, tamsulosin, losartan, and warfarin.  No orders of the defined types were placed in this encounter.    Follow-up: Return in about 6 months (around 09/22/2016).  Rodney Calico, MD

## 2016-03-26 ENCOUNTER — Encounter: Payer: Self-pay | Admitting: Internal Medicine

## 2016-03-26 ENCOUNTER — Ambulatory Visit (INDEPENDENT_AMBULATORY_CARE_PROVIDER_SITE_OTHER): Payer: Medicare Other | Admitting: *Deleted

## 2016-03-26 DIAGNOSIS — Z5181 Encounter for therapeutic drug level monitoring: Secondary | ICD-10-CM

## 2016-03-26 DIAGNOSIS — I4891 Unspecified atrial fibrillation: Secondary | ICD-10-CM

## 2016-03-26 LAB — POCT INR: INR: 3.7

## 2016-03-26 LAB — THYROID PANEL WITH TSH
FREE THYROXINE INDEX: 1.9 (ref 1.4–3.8)
T3 UPTAKE: 29 % (ref 22–35)
T4 TOTAL: 6.7 ug/dL (ref 4.5–12.0)
TSH: 5.55 mIU/L — ABNORMAL HIGH (ref 0.40–4.50)

## 2016-04-19 NOTE — Telephone Encounter (Signed)
Patient called in wanting his labs printed off. He could not find it on Mychart. I printed them off and did mail them to him.

## 2016-04-23 ENCOUNTER — Ambulatory Visit (INDEPENDENT_AMBULATORY_CARE_PROVIDER_SITE_OTHER): Payer: Medicare Other | Admitting: *Deleted

## 2016-04-23 DIAGNOSIS — I4891 Unspecified atrial fibrillation: Secondary | ICD-10-CM

## 2016-04-23 DIAGNOSIS — Z5181 Encounter for therapeutic drug level monitoring: Secondary | ICD-10-CM | POA: Diagnosis not present

## 2016-04-23 LAB — POCT INR: INR: 2.5

## 2016-05-02 ENCOUNTER — Other Ambulatory Visit: Payer: Self-pay | Admitting: Cardiology

## 2016-05-02 DIAGNOSIS — I10 Essential (primary) hypertension: Secondary | ICD-10-CM

## 2016-05-02 NOTE — Telephone Encounter (Signed)
Rx(s) sent to pharmacy electronically.  

## 2016-05-27 ENCOUNTER — Other Ambulatory Visit: Payer: Self-pay | Admitting: Cardiology

## 2016-06-04 ENCOUNTER — Ambulatory Visit (INDEPENDENT_AMBULATORY_CARE_PROVIDER_SITE_OTHER): Payer: Medicare Other | Admitting: Pharmacist

## 2016-06-04 DIAGNOSIS — I4891 Unspecified atrial fibrillation: Secondary | ICD-10-CM

## 2016-06-04 DIAGNOSIS — Z5181 Encounter for therapeutic drug level monitoring: Secondary | ICD-10-CM | POA: Diagnosis not present

## 2016-06-04 LAB — POCT INR: INR: 2.6

## 2016-06-05 ENCOUNTER — Encounter: Payer: Self-pay | Admitting: Gastroenterology

## 2016-06-24 ENCOUNTER — Encounter: Payer: Self-pay | Admitting: Physician Assistant

## 2016-06-24 ENCOUNTER — Ambulatory Visit (INDEPENDENT_AMBULATORY_CARE_PROVIDER_SITE_OTHER): Payer: Medicare Other | Admitting: Physician Assistant

## 2016-06-24 VITALS — BP 124/62 | HR 72 | Ht 65.0 in | Wt 168.4 lb

## 2016-06-24 DIAGNOSIS — Z86010 Personal history of colonic polyps: Secondary | ICD-10-CM

## 2016-06-24 DIAGNOSIS — Z7901 Long term (current) use of anticoagulants: Secondary | ICD-10-CM

## 2016-06-24 MED ORDER — NA SULFATE-K SULFATE-MG SULF 17.5-3.13-1.6 GM/177ML PO SOLN: 1.0000 | ORAL | 0 refills | Status: DC

## 2016-06-24 NOTE — Progress Notes (Signed)
Subjective:    Patient ID: [name], male    DOB: 10-Mar-1946, 71 y.o.   MRN: [id number]  HPI Vanden is a pleasant 71 year old African-American male known to Dr. Fuller Plan from previous colonoscopy. He comes in today to discuss recall colonoscopy. Patient last had colonoscopy in May 2013 with 25 mm polyps removed from the sigmoid colon. These were hyperplastic polyps. He had colonoscopy done in 2010 with removal of a 9 mm adenomatous polyp. He was recommended for 5 year interval follow-up. Patient has history of chronic atrial fibrillation for which she is maintained on Coumadin. He is followed by Dr. Stanford Breed. Also with hypertension, coronary artery disease, hypothyroidism and is hard of hearing. He has no current complaints, specifically denies any problems with abdominal discomfort, changes in bowel habits melena or hematochezia. He has not had any recent cardiac or pulmonary issues.  Review of Systems Pertinent positive and negative review of systems were noted in the above HPI section.  All other review of systems was otherwise negative.  Outpatient Encounter Prescriptions as of 06/24/2016  Medication Sig  . Calcium Carbonate-Vitamin D (CALCIUM-VITAMIN D) 600-200 MG-UNIT CAPS Take 1 tablet by mouth 2 (two) times daily.   . clotrimazole-betamethasone (LOTRISONE) cream Apply 1 application topically 2 (two) times daily.  Marland Kitchen diltiazem (CARTIA XT) 300 MG 24 hr capsule Take 1 capsule (300 mg total) by mouth daily.  . dorzolamide-timolol (COSOPT) 22.3-6.8 MG/ML ophthalmic solution   . losartan (COZAAR) 100 MG tablet Take 1 tablet (100 mg total) by mouth daily.  . Magnesium 300 MG CAPS Take 1 capsule by mouth daily.    . tamsulosin (FLOMAX) 0.4 MG CAPS capsule Take 1 capsule (0.4 mg total) by mouth daily.  . vitamin E 400 UNIT capsule Take 400 Units by mouth daily.    Marland Kitchen warfarin (COUMADIN) 5 MG tablet take as directed BY COUMADIN CLINIC  . Na Sulfate-K Sulfate-Mg Sulf (SUPREP BOWEL PREP KIT)  17.5-3.13-1.6 GM/180ML SOLN Take 1 kit by mouth as directed.   No facility-administered encounter medications on file as of 06/24/2016.    Allergies  Allergen Reactions  . Statins   . Lipitor [Atorvastatin Calcium]     Muscle aches  . Lovastatin     Muscle aches   Patient Active Problem List   Diagnosis Date Noted  . Tinea cruris 09/15/2014  . Encounter for therapeutic drug monitoring 04/20/2013  . Constipation, chronic 10/21/2012  . Prostate nodule without urinary obstruction 04/20/2012  . Routine general medical examination at a health care facility 04/20/2012  . Other abnormal glucose 06/05/2011  . Cerebrovascular disease 07/03/2010  . Hypothyroidism 04/26/2009  . VITAMIN D DEFICIENCY 04/26/2009  . Hyperlipidemia with target LDL less than 70 12/05/2008  . DECREASED HEARING, BILATERAL 06/29/2008  . 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 CAD (coronary artery disease), native coronary artery 05/27/2008  . Atrial fibrillation ([place]) 05/27/2008  . Essential hypertension 09/23/2006   Social History   Social History  . Marital status: Married    Spouse name: N/A  . Number of children: 6  . Years of education: N/A   Occupational History  . Retired   .  Retired   Social History Main Topics  . Smoking status: Former Research scientist (life sciences)  . Smokeless tobacco: Never Used     Comment: quit x 35 years ago  . Alcohol use No  . Drug use: No  . Sexual activity: No   Other Topics Concern  . Not on file   Social History Narrative  Lives with spouse    Mr. Williamson family history includes Heart disease in his mother.      Objective:    Vitals:   06/24/16 0947  BP: 124/62  Pulse: 72    Physical Exam well-developed older African-American male in no acute distress, accompanied by his wife, pleasant blood pressure 124/62 pulse 72, BMI 28.0. HEENT nontraumatic normocephalic EOMI PERRLA sclera anicteric, Cardiovascular ; regular rate and rhythm with S1-S2, Pulmonary clear bilaterally,  Abdomen soft nontender nondistended bowel sounds are active there is no palpable mass or hepatosplenomegaly, Rectal exam not done, Extremities no clubbing cyanosis or edema skin warm and dry, Neuropsych mood and affect appropriate       Assessment & Plan:   #107 71 year old African-American male with history of adenomatous colon polyps 2010, hyperplastic polyps at colonoscopy 2013 due for 5 year interval follow-up #2 chronic anticoagulation-on Coumadin #3 chronic atrial fibrillation #4 hypertension #5 coronary artery disease #6 hypothyroidism #7 hard of hearing  Plan; Patient will be scheduled for colonoscopy with Dr. Fuller Plan. Procedure discussed in detail with patient and his wife including risks and benefits and they are agreeable to proceed. Patient will need to hold Coumadin for 5 days prior to colonoscopy. We will communicate with his cardiologist Dr. Stanford Breed to assure that this plan is reasonable in this patient.  Shadow Stiggers Genia Harold PA-C 06/24/2016   Cc: Janith Lima, MD

## 2016-06-24 NOTE — Progress Notes (Signed)
Reviewed and agree with management plan.  Shoua Ressler T. Betina Puckett, MD FACG 

## 2016-06-24 NOTE — Progress Notes (Signed)
Subjective:    Patient ID: [name], male    DOB: 02/10/1946, 71 y.o.   MRN: [id number]  HPI [name] is a pleasant 71 year old African-American male known to Dr. Fuller Wells. He has history of adenomatous colon polyps and comes in today to discuss recall colonoscopy. Patient last had colonoscopy in May 2013 with finding of 25 mm polyps in the sigmoid colon. Biopsies showed hyperplastic polyps. Patient has previous history of adenomatous polyps in 2010. Patient has history of chronic atrial fibrillation for which she is maintained on Coumadin, and followed by Dr. Stanford Wells.  Also with hypertension, coronary artery disease, hypothyroidism. Patient has no current complaints. Specifically denies any problems with abdominal discomfort, changes in bowel habits melena or hematochezia. He has not had any recent cardiac or pulmonary issues.  Review of Systems Pertinent positive and negative review of systems were noted in the above HPI section.  All other review of systems was otherwise negative.  Outpatient Encounter Prescriptions as of 06/24/2016  Medication Sig  . Calcium Carbonate-Vitamin D (CALCIUM-VITAMIN D) 600-200 MG-UNIT CAPS Take 1 tablet by mouth 2 (two) times daily.   . clotrimazole-betamethasone (LOTRISONE) cream Apply 1 application topically 2 (two) times daily.  Marland Kitchen diltiazem (CARTIA XT) 300 MG 24 hr capsule Take 1 capsule (300 mg total) by mouth daily.  . dorzolamide-timolol (COSOPT) 22.3-6.8 MG/ML ophthalmic solution   . losartan (COZAAR) 100 MG tablet Take 1 tablet (100 mg total) by mouth daily.  . Magnesium 300 MG CAPS Take 1 capsule by mouth daily.    . tamsulosin (FLOMAX) 0.4 MG CAPS capsule Take 1 capsule (0.4 mg total) by mouth daily.  . vitamin E 400 UNIT capsule Take 400 Units by mouth daily.    Marland Kitchen warfarin (COUMADIN) 5 MG tablet take as directed BY COUMADIN CLINIC  . Na Sulfate-K Sulfate-Mg Sulf (SUPREP BOWEL PREP KIT) 17.5-3.13-1.6 GM/180ML SOLN Take 1 kit by mouth as directed.    No facility-administered encounter medications on file as of 06/24/2016.    Allergies  Allergen Reactions  . Statins   . Lipitor [Atorvastatin Calcium]     Muscle aches  . Lovastatin     Muscle aches   Patient Active Problem List   Diagnosis Date Noted  . Tinea cruris 09/15/2014  . Encounter for therapeutic drug monitoring 04/20/2013  . Constipation, chronic 10/21/2012  . Prostate nodule without urinary obstruction 04/20/2012  . Routine general medical examination at a health care facility 04/20/2012  . Other abnormal glucose 06/05/2011  . Cerebrovascular disease 07/03/2010  . Hypothyroidism 04/26/2009  . VITAMIN D DEFICIENCY 04/26/2009  . Hyperlipidemia with target LDL less than 70 12/05/2008  . DECREASED HEARING, BILATERAL 06/29/2008  . 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 CAD (coronary artery disease), native coronary artery 05/27/2008  . Atrial fibrillation ([place]) 05/27/2008  . Essential hypertension 09/23/2006   Social History   Social History  . Marital status: Married    Spouse name: N/A  . Number of children: 6  . Years of education: N/A   Occupational History  . Retired   .  Retired   Social History Main Topics  . Smoking status: Former Research scientist (life sciences)  . Smokeless tobacco: Never Used     Comment: quit x 35 years ago  . Alcohol use No  . Drug use: No  . Sexual activity: No   Other Topics Concern  . Not on file   Social History Narrative   Lives with spouse    [name] family history includes Heart disease  in his mother.      Objective:    Vitals:   06/24/16 0947  BP: 124/62  Pulse: 72    Physical Exam well-developed older African-American male in no acute distress, accompanied by his wife. Patient is hard of hearing. Blood pressure 124/62 pulse 72, height 5 foot 5, weight 168, BMI 28. HEENT; nontraumatic normocephalic EOMI PERRLA sclera anicteric, Cardiovascular; ir regular rate and rhythm with S1-S2 no murmur or gallop, Pulmonary; clear  bilaterally, Abdomen;; soft, nontender nondistended bowel sounds are active there is no palpable mass or hepatosplenomegaly, Rectal; exam not done, Ext; no clubbing cyanosis or edema skin warm and dry, Neuropsych; mood and affect appropriate       Assessment & Wells:   #59 71 year old white male with history of adenomatous colon polyps on colonoscopy 2010, follow-up colonoscopy 2013 with 2 small hyperplastic polyps. Patient is due for follow-up colonoscopy.  #2 chronic anticoagulation with Coumadin #3 atrial fibrillation #4 hypertension #5 coronary artery disease #6 hypothyroidism  Wells; patient will be scheduled for colonoscopy with Dr. Fuller Wells. Procedure discussed in detail with patient and his wife including risks and benefits and he is agreeable to proceed. Patient will need to hold Coumadin for 5 days prior to the procedure. We will communicate with his cardiologist Dr. Stanford Wells to assure that this Wells  is reasonable for this patient.  Amy Genia Harold PA-C 06/24/2016   Cc: Rodney Lima, MD

## 2016-06-24 NOTE — Patient Instructions (Signed)
You have been scheduled for a colonoscopy. Please follow written instructions given to you at your visit today.  Please pick up your prep supplies at the pharmacy within the next 1-3 days. If you use inhalers (even only as needed), please bring them with you on the day of your procedure. Your physician has requested that you go to www.startemmi.com and enter the access code given to you at your visit today. This web site gives a general overview about your procedure. However, you should still follow specific instructions given to you by our office regarding your preparation for the procedure.  You may have a light breakfast the morning of prep day (the day before the procedure).   You may choose from one of the following items: eggs and toast or chicken noodle soup and crackers.   You should have your breakfast completed between 8:00 and 9:00 am the day before your procedure.  After you have had your light breakfast you should start a clear liquid dietonly NO SOLIDS. No additional solid food is allowed. You may continue to have clear liquid up to 3 hours prior to your procedure.

## 2016-06-28 ENCOUNTER — Telehealth: Payer: Self-pay | Admitting: Emergency Medicine

## 2016-06-28 NOTE — Telephone Encounter (Signed)
Left message to call office

## 2016-06-28 NOTE — Telephone Encounter (Signed)
   [name] 1945/12/25 [id number]  Dear Dr. Stanford Breed:  We have scheduled the above named patient for a(n) colonoscopy procedure. Our records show that (s)he is on anticoagulation therapy.  Please advise as to whether the patient may come of their therapy of 7 days days prior to their procedure which is scheduled for 07-29-16.  Please route your response to Tinnie Gens, CMA or fax response to [phone].  Sincerely,    [facility]

## 2016-06-28 NOTE — Telephone Encounter (Signed)
DC coumadin 5 days prior to procedure and resume day of; will need lovenox bridge given h/o embolic event. Kirk Ruths

## 2016-07-02 NOTE — Telephone Encounter (Signed)
Left message to call office

## 2016-07-12 NOTE — Telephone Encounter (Signed)
Spoke with Dr. Jacalyn Lefevre nurse, patient has appointment with Coumadin clinic and they will go over Lovenox bridge with him then.

## 2016-07-16 ENCOUNTER — Ambulatory Visit (INDEPENDENT_AMBULATORY_CARE_PROVIDER_SITE_OTHER): Payer: Medicare Other | Admitting: Pharmacist

## 2016-07-16 DIAGNOSIS — I4891 Unspecified atrial fibrillation: Secondary | ICD-10-CM | POA: Diagnosis not present

## 2016-07-16 DIAGNOSIS — Z5181 Encounter for therapeutic drug level monitoring: Secondary | ICD-10-CM | POA: Diagnosis not present

## 2016-07-16 LAB — POCT INR: INR: 2.4

## 2016-07-16 NOTE — Telephone Encounter (Signed)
We need to continue to manage his coumadin if pt willing; needs lovenox bridge if willing any time off coumadin; if he refuses, document and explain risks [name]

## 2016-07-16 NOTE — Telephone Encounter (Signed)
Pt presents today for Coumadin check and Lovenox instructions. Pt becomes irate and yells throughout the entire visit that he will never use Lovenox injections. Attempted to explain to patient multiple times that given his history of afib and stroke, he is at a much higher risk of developing another stroke if he does not use injections. He interrupted me multiple times throughout this explanation and continued to refuse to listen. I actually needed to ask the patient to stop speaking over me and interrupting me, which he then continued to do. His daughter was unsuccessful in asking him to listen. Ultimately, patient is aware of the risks of medical noncompliance and his increased risk of stroke.  He also refuses to come in for Coumadin checks sooner than every 6 weeks. This is often times against our medical advice and clinic policies when his INR is out of range. We are unable to successfully manage and dose his Coumadin when he is noncompliant with follow up instructions.  Will route this to Dr Stanford Breed so that he is aware, and also to see if he would like [place] to continue to manage patient's Coumadin given his continued noncompliance with clinic follow up policies and MD recommendations for Lovenox treatment.

## 2016-07-16 NOTE — Patient Instructions (Addendum)
Your procedure is scheduled for Monday, 07/29/2016  Continue taking 1.5 tablets (7.5mg ) everyday except 1 tablet (5mg ) on Mondays, Wednesdays, and Fridays Your last dose will be 07/23/2016  07/29/2016 (Monday) - Procedure day, no warfarin. In the evening, take 2 tablets of warfarin  07/30/2016 (Tuesday) - Take 1.5 tablets of warfarin  Then go back to normal dosing. Take 1.5 tablets (7.5mg ) everyday except 1 tablet (5mg ) on Mondays, Wednesdays, and Fridays

## 2016-07-25 DIAGNOSIS — H25011 Cortical age-related cataract, right eye: Secondary | ICD-10-CM | POA: Diagnosis not present

## 2016-07-25 DIAGNOSIS — H401131 Primary open-angle glaucoma, bilateral, mild stage: Secondary | ICD-10-CM | POA: Diagnosis not present

## 2016-07-25 DIAGNOSIS — H2511 Age-related nuclear cataract, right eye: Secondary | ICD-10-CM | POA: Diagnosis not present

## 2016-07-29 ENCOUNTER — Encounter: Payer: Self-pay | Admitting: Gastroenterology

## 2016-07-29 ENCOUNTER — Ambulatory Visit (AMBULATORY_SURGERY_CENTER): Payer: Medicare Other | Admitting: Gastroenterology

## 2016-07-29 VITALS — BP 128/63 | HR 70 | Temp 98.9°F | Resp 19 | Ht 65.0 in | Wt 168.0 lb

## 2016-07-29 DIAGNOSIS — D125 Benign neoplasm of sigmoid colon: Secondary | ICD-10-CM

## 2016-07-29 DIAGNOSIS — D120 Benign neoplasm of cecum: Secondary | ICD-10-CM

## 2016-07-29 DIAGNOSIS — I252 Old myocardial infarction: Secondary | ICD-10-CM | POA: Diagnosis not present

## 2016-07-29 DIAGNOSIS — K621 Rectal polyp: Secondary | ICD-10-CM | POA: Diagnosis not present

## 2016-07-29 DIAGNOSIS — Z86010 Personal history of colonic polyps: Secondary | ICD-10-CM

## 2016-07-29 DIAGNOSIS — D128 Benign neoplasm of rectum: Secondary | ICD-10-CM

## 2016-07-29 DIAGNOSIS — I2510 Atherosclerotic heart disease of native coronary artery without angina pectoris: Secondary | ICD-10-CM | POA: Diagnosis not present

## 2016-07-29 DIAGNOSIS — D123 Benign neoplasm of transverse colon: Secondary | ICD-10-CM | POA: Diagnosis not present

## 2016-07-29 DIAGNOSIS — I4891 Unspecified atrial fibrillation: Secondary | ICD-10-CM | POA: Diagnosis not present

## 2016-07-29 MED ORDER — SODIUM CHLORIDE 0.9 % IV SOLN: 500.0000 mL | INTRAVENOUS | Status: DC

## 2016-07-29 NOTE — Patient Instructions (Signed)
Impression/Recommendations:  Polyp handout given to patient.  Repeat colonoscopy in 3-5 years for surveillance, based on pathology report.  Resume Coumadin (Warfarin) tomorrow.  No aspirin, ibuprofen, naproxen, or other NSAID drugs for 2 weeks.   Tylenol only until August 13, 2016.  YOU HAD AN ENDOSCOPIC PROCEDURE TODAY AT [facility]:   Refer to the procedure report that was given to you for any specific questions about what was found during the examination.  If the procedure report does not answer your questions, please call your gastroenterologist to clarify.  If you requested that your care partner not be given the details of your procedure findings, then the procedure report has been included in a sealed envelope for you to review at your convenience later.  YOU SHOULD EXPECT: Some feelings of bloating in the abdomen. Passage of more gas than usual.  Walking can help get rid of the air that was put into your GI tract during the procedure and reduce the bloating. If you had a lower endoscopy (such as a colonoscopy or flexible sigmoidoscopy) you may notice spotting of blood in your stool or on the toilet paper. If you underwent a bowel prep for your procedure, you may not have a normal bowel movement for a few days.  Please Note:  You might notice some irritation and congestion in your nose or some drainage.  This is from the oxygen used during your procedure.  There is no need for concern and it should clear up in a day or so.  SYMPTOMS TO REPORT IMMEDIATELY:   Following lower endoscopy (colonoscopy or flexible sigmoidoscopy):  Excessive amounts of blood in the stool  Significant tenderness or worsening of abdominal pains  Swelling of the abdomen that is new, acute  Fever of 100F or higher For urgent or emergent issues, a gastroenterologist can be reached at any hour by calling [phone].   DIET:  We do recommend a small meal at first, but then you may proceed to  your regular diet.  Drink plenty of fluids but you should avoid alcoholic beverages for 24 hours.  ACTIVITY:  You should plan to take it easy for the rest of today and you should NOT DRIVE or use heavy machinery until tomorrow (because of the sedation medicines used during the test).    FOLLOW UP: Our staff will call the number listed on your records the next business day following your procedure to check on you and address any questions or concerns that you may have regarding the information given to you following your procedure. If we do not reach you, we will leave a message.  However, if you are feeling well and you are not experiencing any problems, there is no need to return our call.  We will assume that you have returned to your regular daily activities without incident.  If any biopsies were taken you will be contacted by phone or by letter within the next 1-3 weeks.  Please call us at [phone] if you have not heard about the biopsies in 3 weeks.    SIGNATURES/CONFIDENTIALITY: You and/or your care partner have signed paperwork which will be entered into your electronic medical record.  These signatures attest to the fact that that the information above on your After Visit Summary has been reviewed and is understood.  Full responsibility of the confidentiality of this discharge information lies with you and/or your care-partner.

## 2016-07-29 NOTE — Progress Notes (Signed)
Called to room to assist during endoscopic procedure.  Patient ID and intended procedure confirmed with present staff. Received instructions for my participation in the procedure from the performing physician.  

## 2016-07-29 NOTE — Progress Notes (Signed)
Report given to PACU, vss 

## 2016-07-29 NOTE — Op Note (Addendum)
[name] Procedure Date: 07/29/2016 3:33 PM MRN: [id number] Endoscopist: Ladene Artist , MD Age: 71 Referring MD:  Date of Birth: [birth year] Gender: Male Account #: [id number] Procedure:                Colonoscopy Indications:              Surveillance: Personal history of adenomatous                            polyps on last colonoscopy 5 years ago Medicines:                Monitored Anesthesia Care Procedure:                Pre-Anesthesia Assessment:                           - Prior to the procedure, a History and Physical                            was performed, and patient medications and                            allergies were reviewed. The patient's tolerance of                            previous anesthesia was also reviewed. The risks                            and benefits of the procedure and the sedation                            options and risks were discussed with the patient.                            All questions were answered, and informed consent                            was obtained. Prior Anticoagulants: The patient has                            taken Coumadin (warfarin), last dose was 5 days                            prior to procedure. ASA Grade Assessment: III - A                            patient with severe systemic disease. After                            reviewing the risks and benefits, the patient was                            deemed in satisfactory condition to undergo the  procedure.                           After obtaining informed consent, the colonoscope                            was passed under direct vision. Throughout the                            procedure, the patient's blood pressure, pulse, and                            oxygen saturations were monitored continuously. The                            Colonoscope was introduced through the anus and                 advanced to the the cecum, identified by                            appendiceal orifice and ileocecal valve. The                            ileocecal valve, appendiceal orifice, and rectum                            were photographed. The quality of the bowel                            preparation was good. The colonoscopy was performed                            without difficulty. The patient tolerated the                            procedure well. Scope In: 3:41:48 PM Scope Out: 4:00:34 PM Scope Withdrawal Time: 0 hours 16 minutes 27 seconds  Total Procedure Duration: 0 hours 18 minutes 46 seconds  Findings:                 The perianal and digital rectal examinations were                            normal.                           Four sessile polyps were found in the sigmoid                            colon, transverse colon, hepatic flexure and cecum.                            The polyps were 6 to 8 mm in size. These polyps  were removed with a cold snare. Resection and                            retrieval were complete.                           Two sessile polyps were found in the rectum. The                            polyps were 4 to 5 mm in size. These polyps were                            removed with a cold biopsy forceps. Resection and                            retrieval were complete.                           The exam was otherwise without abnormality on                            direct and retroflexion views. Complications:            No immediate complications. Estimated blood loss:                            None. Estimated Blood Loss:     Estimated blood loss: none. Impression:               - Four 6 to 8 mm polyps in the sigmoid colon, in                            the transverse colon, at the hepatic flexure and in                            the cecum, removed with a cold snare. Resected and                             retrieved.                           - Two 4 to 5 mm polyps in the rectum, removed with                            a cold biopsy forceps. Resected and retrieved.                           - The examination was otherwise normal on direct                            and retroflexion views. Recommendation:           - Repeat colonoscopy in 3 - 5 years for  surveillance pending pathology review.                           - Resume Coumadin (warfarin) tomorrow at prior                            dose. Refer to managing physician for further                            adjustment of therapy.                           - Patient has a contact number available for                            emergencies. The signs and symptoms of potential                            delayed complications were discussed with the                            patient. Return to normal activities tomorrow.                            Written discharge instructions were provided to the                            patient.                           - Resume previous diet.                           - Continue present medications.                           - Await pathology results.                           - No aspirin, ibuprofen, naproxen, or other                            non-steroidal anti-inflammatory drugs for 2 weeks                            after polyp removal. Ladene Artist, MD 07/29/2016 4:04:54 PM This report has been signed electronically.

## 2016-07-30 ENCOUNTER — Telehealth: Payer: Self-pay | Admitting: *Deleted

## 2016-07-30 LAB — HM COLONOSCOPY

## 2016-07-30 NOTE — Telephone Encounter (Signed)
  Follow up Call-  Call back number 07/29/2016  Post procedure Call Back phone  # [phone]  Permission to leave phone message Yes  Some recent data might be hidden     Patient questions:  Do you have a fever, pain , or abdominal swelling? No. Pain Score  0 *  Have you tolerated food without any problems? Yes.    Have you been able to return to your normal activities? Yes.    Do you have any questions about your discharge instructions: Diet   No. Medications  No. Follow up visit  No.  Do you have questions or concerns about your Care? No.  Actions: * If pain score is 4 or above: No action needed, pain <4.  Spoke with wife pt. Was up eating breakfast,she stated" he did fine."

## 2016-08-05 ENCOUNTER — Encounter: Payer: Self-pay | Admitting: Gastroenterology

## 2016-08-06 LAB — HM COLONOSCOPY

## 2016-08-13 ENCOUNTER — Other Ambulatory Visit: Payer: Self-pay | Admitting: Internal Medicine

## 2016-08-13 ENCOUNTER — Telehealth: Payer: Self-pay | Admitting: Internal Medicine

## 2016-08-13 MED ORDER — SCOPOLAMINE 1 MG/3DAYS TD PT72: 1.0000 | MEDICATED_PATCH | TRANSDERMAL | 0 refills | Status: DC

## 2016-08-13 NOTE — Telephone Encounter (Signed)
Please advise - rx for motion sickness

## 2016-08-13 NOTE — Telephone Encounter (Signed)
Pt's wife called stating that they are going to be going on a 15 hour flight on June 9th. She asked if a prescription could be sent in to [facility] on [address] to help him with motion sickness.

## 2016-08-27 ENCOUNTER — Ambulatory Visit (INDEPENDENT_AMBULATORY_CARE_PROVIDER_SITE_OTHER): Payer: Medicare Other | Admitting: *Deleted

## 2016-08-27 DIAGNOSIS — Z5181 Encounter for therapeutic drug level monitoring: Secondary | ICD-10-CM

## 2016-08-27 DIAGNOSIS — I4891 Unspecified atrial fibrillation: Secondary | ICD-10-CM | POA: Diagnosis not present

## 2016-08-27 LAB — POCT INR: INR: 2.8

## 2016-08-30 ENCOUNTER — Other Ambulatory Visit: Payer: Self-pay | Admitting: Cardiology

## 2016-09-16 ENCOUNTER — Other Ambulatory Visit: Payer: Self-pay | Admitting: Internal Medicine

## 2016-09-16 DIAGNOSIS — N402 Nodular prostate without lower urinary tract symptoms: Secondary | ICD-10-CM

## 2016-10-08 ENCOUNTER — Ambulatory Visit (INDEPENDENT_AMBULATORY_CARE_PROVIDER_SITE_OTHER): Payer: Medicare Other | Admitting: Pharmacist

## 2016-10-08 DIAGNOSIS — I4891 Unspecified atrial fibrillation: Secondary | ICD-10-CM | POA: Diagnosis not present

## 2016-10-08 DIAGNOSIS — Z5181 Encounter for therapeutic drug level monitoring: Secondary | ICD-10-CM | POA: Diagnosis not present

## 2016-10-08 LAB — POCT INR: INR: 2.7

## 2016-11-15 ENCOUNTER — Ambulatory Visit (INDEPENDENT_AMBULATORY_CARE_PROVIDER_SITE_OTHER): Payer: Medicare Other | Admitting: *Deleted

## 2016-11-15 DIAGNOSIS — Z5181 Encounter for therapeutic drug level monitoring: Secondary | ICD-10-CM | POA: Diagnosis not present

## 2016-11-15 DIAGNOSIS — I4891 Unspecified atrial fibrillation: Secondary | ICD-10-CM | POA: Diagnosis not present

## 2016-11-15 LAB — POCT INR: INR: 2.3

## 2016-11-30 ENCOUNTER — Other Ambulatory Visit: Payer: Self-pay | Admitting: Cardiology

## 2016-12-24 ENCOUNTER — Ambulatory Visit (INDEPENDENT_AMBULATORY_CARE_PROVIDER_SITE_OTHER): Payer: Medicare Other | Admitting: *Deleted

## 2016-12-24 DIAGNOSIS — I4891 Unspecified atrial fibrillation: Secondary | ICD-10-CM | POA: Diagnosis not present

## 2016-12-24 DIAGNOSIS — Z5181 Encounter for therapeutic drug level monitoring: Secondary | ICD-10-CM

## 2016-12-24 LAB — POCT INR: INR: 2.1

## 2017-02-04 ENCOUNTER — Ambulatory Visit (INDEPENDENT_AMBULATORY_CARE_PROVIDER_SITE_OTHER): Payer: Medicare Other

## 2017-02-04 DIAGNOSIS — I4891 Unspecified atrial fibrillation: Secondary | ICD-10-CM

## 2017-02-04 DIAGNOSIS — Z5181 Encounter for therapeutic drug level monitoring: Secondary | ICD-10-CM | POA: Diagnosis not present

## 2017-02-04 LAB — POCT INR: INR: 2.6

## 2017-02-04 NOTE — Patient Instructions (Signed)
Continue taking 1.5 tablets everyday except 1 tablet on Mondays, Wednesdays, and Fridays.  Recheck in 6 weeks .

## 2017-02-08 ENCOUNTER — Other Ambulatory Visit: Payer: Self-pay | Admitting: Cardiology

## 2017-02-08 DIAGNOSIS — I10 Essential (primary) hypertension: Secondary | ICD-10-CM

## 2017-02-13 ENCOUNTER — Other Ambulatory Visit: Payer: Self-pay

## 2017-03-05 ENCOUNTER — Encounter (HOSPITAL_COMMUNITY): Payer: Self-pay

## 2017-03-05 ENCOUNTER — Emergency Department (HOSPITAL_COMMUNITY)
Admission: EM | Admit: 2017-03-05 | Discharge: 2017-03-05 | Disposition: A | Discharge status: Home / Self Care | Payer: No Typology Code available for payment source | Attending: Emergency Medicine | Admitting: Emergency Medicine

## 2017-03-05 ENCOUNTER — Other Ambulatory Visit: Payer: Self-pay

## 2017-03-05 ENCOUNTER — Emergency Department (HOSPITAL_COMMUNITY): Payer: No Typology Code available for payment source

## 2017-03-05 DIAGNOSIS — S3991XA Unspecified injury of abdomen, initial encounter: Secondary | ICD-10-CM | POA: Diagnosis not present

## 2017-03-05 DIAGNOSIS — R109 Unspecified abdominal pain: Secondary | ICD-10-CM | POA: Diagnosis not present

## 2017-03-05 DIAGNOSIS — I4891 Unspecified atrial fibrillation: Secondary | ICD-10-CM | POA: Insufficient documentation

## 2017-03-05 DIAGNOSIS — S39012A Strain of muscle, fascia and tendon of lower back, initial encounter: Secondary | ICD-10-CM | POA: Insufficient documentation

## 2017-03-05 DIAGNOSIS — E039 Hypothyroidism, unspecified: Secondary | ICD-10-CM | POA: Insufficient documentation

## 2017-03-05 DIAGNOSIS — Z79899 Other long term (current) drug therapy: Secondary | ICD-10-CM | POA: Insufficient documentation

## 2017-03-05 DIAGNOSIS — Y9289 Other specified places as the place of occurrence of the external cause: Secondary | ICD-10-CM | POA: Insufficient documentation

## 2017-03-05 DIAGNOSIS — E785 Hyperlipidemia, unspecified: Secondary | ICD-10-CM | POA: Diagnosis not present

## 2017-03-05 DIAGNOSIS — S3992XA Unspecified injury of lower back, initial encounter: Secondary | ICD-10-CM | POA: Diagnosis not present

## 2017-03-05 DIAGNOSIS — S0990XA Unspecified injury of head, initial encounter: Secondary | ICD-10-CM | POA: Diagnosis not present

## 2017-03-05 DIAGNOSIS — Y998 Other external cause status: Secondary | ICD-10-CM | POA: Diagnosis not present

## 2017-03-05 DIAGNOSIS — R1030 Lower abdominal pain, unspecified: Secondary | ICD-10-CM | POA: Diagnosis not present

## 2017-03-05 DIAGNOSIS — I10 Essential (primary) hypertension: Secondary | ICD-10-CM | POA: Insufficient documentation

## 2017-03-05 DIAGNOSIS — T1490XA Injury, unspecified, initial encounter: Secondary | ICD-10-CM

## 2017-03-05 DIAGNOSIS — Z7901 Long term (current) use of anticoagulants: Secondary | ICD-10-CM | POA: Diagnosis not present

## 2017-03-05 DIAGNOSIS — R51 Headache: Secondary | ICD-10-CM | POA: Insufficient documentation

## 2017-03-05 DIAGNOSIS — Y9389 Activity, other specified: Secondary | ICD-10-CM | POA: Diagnosis not present

## 2017-03-05 DIAGNOSIS — Z87891 Personal history of nicotine dependence: Secondary | ICD-10-CM | POA: Diagnosis not present

## 2017-03-05 DIAGNOSIS — I2510 Atherosclerotic heart disease of native coronary artery without angina pectoris: Secondary | ICD-10-CM | POA: Diagnosis not present

## 2017-03-05 DIAGNOSIS — M545 Low back pain: Secondary | ICD-10-CM | POA: Diagnosis not present

## 2017-03-05 DIAGNOSIS — S199XXA Unspecified injury of neck, initial encounter: Secondary | ICD-10-CM | POA: Diagnosis not present

## 2017-03-05 LAB — COMPREHENSIVE METABOLIC PANEL
ALBUMIN: 4 g/dL (ref 3.5–5.0)
ALT: 23 U/L (ref 17–63)
AST: 29 U/L (ref 15–41)
Alkaline Phosphatase: 79 U/L (ref 38–126)
Anion gap: 6 (ref 5–15)
BUN: 13 mg/dL (ref 6–20)
CHLORIDE: 106 mmol/L (ref 101–111)
CO2: 24 mmol/L (ref 22–32)
CREATININE: 1.09 mg/dL (ref 0.61–1.24)
Calcium: 9.3 mg/dL (ref 8.9–10.3)
GFR calc Af Amer: >60 mL/min (ref >60)
GFR calc non Af Amer: >60 mL/min (ref >60)
GLUCOSE: 154 mg/dL — AB (ref 65–99)
POTASSIUM: 3.2 mmol/L — AB (ref 3.5–5.1)
Sodium: 136 mmol/L (ref 135–145)
Total Bilirubin: 0.8 mg/dL (ref 0.3–1.2)
Total Protein: 7.2 g/dL (ref 6.5–8.1)

## 2017-03-05 LAB — CBC WITH DIFFERENTIAL/PLATELET
Basophils Absolute: 0 10*3/uL (ref 0.0–0.1)
Basophils Relative: 0 %
EOS ABS: 0.2 10*3/uL (ref 0.0–0.7)
EOS PCT: 3 %
HCT: 48.3 % (ref 39.0–52.0)
Hemoglobin: 16.7 g/dL (ref 13.0–17.0)
LYMPHS ABS: 1.5 10*3/uL (ref 0.7–4.0)
LYMPHS PCT: 23 %
MCH: 32.9 pg (ref 26.0–34.0)
MCHC: 34.6 g/dL (ref 30.0–36.0)
MCV: 95.3 fL (ref 78.0–100.0)
MONO ABS: 1.1 10*3/uL — AB (ref 0.1–1.0)
MONOS PCT: 16 %
Neutro Abs: 3.8 10*3/uL (ref 1.7–7.7)
Neutrophils Relative %: 58 %
PLATELETS: 156 10*3/uL (ref 150–400)
RBC: 5.07 MIL/uL (ref 4.22–5.81)
RDW: 12.2 % (ref 11.5–15.5)
WBC: 6.6 10*3/uL (ref 4.0–10.5)

## 2017-03-05 LAB — I-STAT CREATININE, ED: Creatinine, Ser: 1 mg/dL (ref 0.61–1.24)

## 2017-03-05 LAB — PROTIME-INR
INR: 2.17
PROTHROMBIN TIME: 24 s — AB (ref 11.4–15.2)

## 2017-03-05 MED ORDER — IOPAMIDOL (ISOVUE-300) INJECTION 61%
INTRAVENOUS | Status: AC
  Administered 2017-03-05: 100 mL
  Filled 2017-03-05: qty 100

## 2017-03-05 MED ORDER — HYDROCODONE-ACETAMINOPHEN 5-325 MG PO TABS
2.0000 | ORAL_TABLET | Freq: Once | ORAL | Status: AC
  Administered 2017-03-05: 2 via ORAL
  Filled 2017-03-05: qty 2

## 2017-03-05 NOTE — ED Notes (Signed)
Bladder scanned pt, pt had 200ML in bladder Pt used the urinal and had 250 output.
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
## 2017-03-17 ENCOUNTER — Other Ambulatory Visit: Payer: Self-pay

## 2017-03-18 ENCOUNTER — Ambulatory Visit (INDEPENDENT_AMBULATORY_CARE_PROVIDER_SITE_OTHER): Payer: Medicare Other | Admitting: *Deleted

## 2017-03-18 DIAGNOSIS — Z5181 Encounter for therapeutic drug level monitoring: Secondary | ICD-10-CM | POA: Diagnosis not present

## 2017-03-18 DIAGNOSIS — I4891 Unspecified atrial fibrillation: Secondary | ICD-10-CM

## 2017-03-18 LAB — POCT INR: INR: 2.6

## 2017-03-18 MED ORDER — WARFARIN SODIUM 5 MG PO TABS: ORAL_TABLET | ORAL | 0 refills | Status: DC

## 2017-03-18 NOTE — Patient Instructions (Signed)
Description   Continue taking 1.5 tablets everyday except 1 tablet on Mondays, Wednesdays, and Fridays.  Recheck in 6 weeks .

## 2017-04-29 ENCOUNTER — Ambulatory Visit (INDEPENDENT_AMBULATORY_CARE_PROVIDER_SITE_OTHER): Payer: Medicare Other | Admitting: *Deleted

## 2017-04-29 ENCOUNTER — Other Ambulatory Visit: Payer: Self-pay | Admitting: Cardiology

## 2017-04-29 DIAGNOSIS — I10 Essential (primary) hypertension: Secondary | ICD-10-CM

## 2017-04-29 DIAGNOSIS — Z5181 Encounter for therapeutic drug level monitoring: Secondary | ICD-10-CM

## 2017-04-29 DIAGNOSIS — I4891 Unspecified atrial fibrillation: Secondary | ICD-10-CM

## 2017-04-29 LAB — POCT INR: INR: 2.2

## 2017-04-29 NOTE — Patient Instructions (Signed)
Description   Continue taking 1.5 tablets everyday except 1 tablet on Mondays, Wednesdays, and Fridays.  Recheck in 6 weeks .

## 2017-04-29 NOTE — Telephone Encounter (Signed)
Rx(s) sent to pharmacy electronically.  

## 2017-05-01 NOTE — Progress Notes (Signed)
HPI: FU Atrial fibrillation; history of an acute anterior myocardial infarction occurring in the setting of atrial fibrillation, felt secondary to an embolic event. His LV function is preserved. We have been treating with rate control and anticoagulation at his request. His previous cardiac catheterization in August of 2009 revealed an occluded LAD but no other obstructive disease. The LAD occlusion was felt secondary to an embolus and resolved with thrombus aspiration. Abdominal ultrasound in October 2014 showed no aneurysm. Echocardiogram repeated in November 2014 and showed normal LV function, mild left ventricular hypertrophy and moderate left atrial enlargement. Carotid Dopplers in Nov 2015 showed no significant stenosis.  Patient had an abdominal CT December 2018 that showed soft tissue prominence between the prostate and urinary bladder.  Clinical correlation and PSA recommended.  Since I last saw him, the patient denies any dyspnea on exertion, orthopnea, PND, pedal edema, palpitations, syncope or chest pain.    Current Outpatient Medications  Medication Sig Dispense Refill  . Calcium Carbonate-Vitamin D (CALCIUM-VITAMIN D) 600-200 MG-UNIT CAPS Take 1 tablet by mouth 2 (two) times daily.     . clotrimazole-betamethasone (LOTRISONE) cream Apply 1 application topically 2 (two) times daily. 30 g 0  . diltiazem (CARDIZEM CD) 300 MG 24 hr capsule take 1 capsule by mouth daily 90 capsule 0  . dorzolamide-timolol (COSOPT) 22.3-6.8 MG/ML ophthalmic solution Place 1 drop into both eyes 3 (three) times daily.   1  . losartan (COZAAR) 100 MG tablet take 1 tablet by mouth once daily 90 tablet 3  . Magnesium 300 MG CAPS Take 1 capsule by mouth daily.      . Multiple Vitamins-Minerals (MULTIVITAMIN WITH MINERALS) tablet Take 1 tablet by mouth daily.    . tamsulosin (FLOMAX) 0.4 MG CAPS capsule take 1 capsule by mouth once daily 90 capsule 3  . vitamin E 400 UNIT capsule Take 400 Units by mouth  daily.      Marland Kitchen warfarin (COUMADIN) 5 MG tablet take 1 to 1.5 tablets by mouth once daily as directed BY COUMADIN CLINIC 135 tablet 0   No current facility-administered medications for this visit.      Past Medical History:  Diagnosis Date  . 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 Arthritis   . Atrial fibrillation ([place])   . Cataract    right eye   . Glaucoma   . Hard of hearing   . Hemorrhoid   . Hx of adenomatous colonic polyps 07/2008  . Hyperlipidemia   . Hypertension     Past Surgical History:  Procedure Laterality Date  . COLONOSCOPY    . POLYPECTOMY    . STAPEDES SURGERY      Social History   Socioeconomic History  . Marital status: Married    Spouse name: Not on file  . Number of children: 6  . Years of education: Not on file  . Highest education level: Not on file  Social Needs  . Financial resource strain: Not on file  . Food insecurity - worry: Not on file  . Food insecurity - inability: Not on file  . Transportation needs - medical: Not on file  . Transportation needs - non-medical: Not on file  Occupational History  . Occupation: Retired    Fish farm manager: RETIRED  Tobacco Use  . Smoking status: Former Research scientist (life sciences)  . Smokeless tobacco: Never Used  . Tobacco comment: quit x 35 years ago  Substance and Sexual Activity  .  Alcohol use: No    Alcohol/week: 0.0 oz  . Drug use: No  . Sexual activity: No  Other Topics Concern  . Not on file  Social History Narrative   Lives with spouse    Family History  Problem Relation Age of Onset  . Heart disease Mother   . Cancer Neg Hx   . Stroke Neg Hx   . Hypertension Neg Hx   . Hyperlipidemia Neg Hx   . Diabetes Neg Hx   . Esophageal cancer Neg Hx   . Colon cancer Neg Hx   . Pancreatic cancer Neg Hx   . Stomach cancer Neg Hx     ROS: no fevers or chills, productive cough, hemoptysis, dysphasia, odynophagia, melena, hematochezia, dysuria, hematuria,  rash, seizure activity, orthopnea, PND, pedal edema, claudication. Remaining systems are negative.  Physical Exam: Well-developed well-nourished in no acute distress.  Skin is warm and dry.  HEENT is normal.  Neck is supple.  Chest is clear to auscultation with normal expansion.  Cardiovascular exam is irregular Abdominal exam nontender or distended. No masses palpated. Extremities show no edema. neuro grossly intact  ECG-atrial fibrillation at a rate of 89.  No ST changes.  Personally reviewed  A/P  1 permanent atrial fibrillation-patient continues to do well from a symptomatic standpoint.  Her plan is rate control and anticoagulation as outlined in previous notes.  Continue Cardizem.  Continue Coumadin.  Patient not interested in DOACs.  2 hypertension-blood pressure is elevated; however he states typically controlled.  Continue present medications and follow; advance regimen as needed.  3 hyperlipidemia-continue diet. Patient is intolerant to statin.  4 Abnormal abdominal CT- I have asked patient to follow-up with primary care.  Kirk Ruths, MD

## 2017-05-08 ENCOUNTER — Ambulatory Visit (INDEPENDENT_AMBULATORY_CARE_PROVIDER_SITE_OTHER): Payer: Medicare Other | Admitting: Cardiology

## 2017-05-08 ENCOUNTER — Encounter: Payer: Self-pay | Admitting: Cardiology

## 2017-05-08 VITALS — BP 154/90 | HR 89 | Ht 65.0 in | Wt 172.0 lb

## 2017-05-08 DIAGNOSIS — I482 Chronic atrial fibrillation: Secondary | ICD-10-CM | POA: Diagnosis not present

## 2017-05-08 DIAGNOSIS — E7800 Pure hypercholesterolemia, unspecified: Secondary | ICD-10-CM

## 2017-05-08 DIAGNOSIS — I10 Essential (primary) hypertension: Secondary | ICD-10-CM | POA: Diagnosis not present

## 2017-05-08 DIAGNOSIS — I4821 Permanent atrial fibrillation: Secondary | ICD-10-CM

## 2017-05-08 NOTE — Patient Instructions (Signed)
Your physician wants you to follow-up in: ONE YEAR WITH DR CRENSHAW You will receive a reminder letter in the mail two months in advance. If you don't receive a letter, please call our office to schedule the follow-up appointment.   If you need a refill on your cardiac medications before your next appointment, please call your pharmacy.  

## 2017-05-26 ENCOUNTER — Ambulatory Visit (INDEPENDENT_AMBULATORY_CARE_PROVIDER_SITE_OTHER): Payer: Medicare Other | Admitting: Internal Medicine

## 2017-05-26 ENCOUNTER — Other Ambulatory Visit (INDEPENDENT_AMBULATORY_CARE_PROVIDER_SITE_OTHER): Payer: Medicare Other

## 2017-05-26 ENCOUNTER — Encounter: Payer: Self-pay | Admitting: Internal Medicine

## 2017-05-26 VITALS — BP 138/80 | HR 83 | Temp 98.8°F | Resp 16 | Ht 65.0 in | Wt 170.2 lb

## 2017-05-26 DIAGNOSIS — E559 Vitamin D deficiency, unspecified: Secondary | ICD-10-CM

## 2017-05-26 DIAGNOSIS — N402 Nodular prostate without lower urinary tract symptoms: Secondary | ICD-10-CM

## 2017-05-26 DIAGNOSIS — E039 Hypothyroidism, unspecified: Secondary | ICD-10-CM

## 2017-05-26 DIAGNOSIS — Z0000 Encounter for general adult medical examination without abnormal findings: Secondary | ICD-10-CM | POA: Diagnosis not present

## 2017-05-26 DIAGNOSIS — E785 Hyperlipidemia, unspecified: Secondary | ICD-10-CM

## 2017-05-26 DIAGNOSIS — I700 Atherosclerosis of aorta: Secondary | ICD-10-CM

## 2017-05-26 DIAGNOSIS — I10 Essential (primary) hypertension: Secondary | ICD-10-CM

## 2017-05-26 DIAGNOSIS — R7303 Prediabetes: Secondary | ICD-10-CM

## 2017-05-26 LAB — CBC WITH DIFFERENTIAL/PLATELET
BASOS ABS: 0.1 10*3/uL (ref 0.0–0.1)
Basophils Relative: 0.9 % (ref 0.0–3.0)
EOS ABS: 0.2 10*3/uL (ref 0.0–0.7)
Eosinophils Relative: 2.1 % (ref 0.0–5.0)
HEMATOCRIT: 48.6 % (ref 39.0–52.0)
Hemoglobin: 16.5 g/dL (ref 13.0–17.0)
LYMPHS ABS: 2.2 10*3/uL (ref 0.7–4.0)
LYMPHS PCT: 24.4 % (ref 12.0–46.0)
MCHC: 34 g/dL (ref 30.0–36.0)
MCV: 97 fl (ref 78.0–100.0)
Monocytes Absolute: 1 10*3/uL (ref 0.1–1.0)
Monocytes Relative: 11.4 % (ref 3.0–12.0)
NEUTROS PCT: 61.2 % (ref 43.0–77.0)
Neutro Abs: 5.5 10*3/uL (ref 1.4–7.7)
PLATELETS: 183 10*3/uL (ref 150.0–400.0)
RBC: 5.01 Mil/uL (ref 4.22–5.81)
RDW: 12.6 % (ref 11.5–15.5)
WBC: 9 10*3/uL (ref 4.0–10.5)

## 2017-05-26 LAB — LIPID PANEL
Cholesterol: 247 mg/dL — ABNORMAL HIGH (ref 0–200)
HDL: 38.4 mg/dL — ABNORMAL LOW (ref >39.00)
NonHDL: 208.7
Total CHOL/HDL Ratio: 6
Triglycerides: 255 mg/dL — ABNORMAL HIGH (ref 0.0–149.0)
VLDL: 51 mg/dL — ABNORMAL HIGH (ref 0.0–40.0)

## 2017-05-26 LAB — URINALYSIS, ROUTINE W REFLEX MICROSCOPIC
BILIRUBIN URINE: NEGATIVE
HGB URINE DIPSTICK: NEGATIVE
Ketones, ur: NEGATIVE
LEUKOCYTES UA: NEGATIVE
NITRITE: NEGATIVE
RBC / HPF: NONE SEEN (ref 0–?)
Specific Gravity, Urine: 1.015 (ref 1.000–1.030)
Total Protein, Urine: NEGATIVE
URINE GLUCOSE: NEGATIVE
Urobilinogen, UA: 0.2 (ref 0.0–1.0)
pH: 7 (ref 5.0–8.0)

## 2017-05-26 LAB — TSH: TSH: 5.6 u[IU]/mL — AB (ref 0.35–4.50)

## 2017-05-26 LAB — COMPREHENSIVE METABOLIC PANEL
ALT: 16 U/L (ref 0–53)
AST: 17 U/L (ref 0–37)
Albumin: 4.5 g/dL (ref 3.5–5.2)
Alkaline Phosphatase: 76 U/L (ref 39–117)
BILIRUBIN TOTAL: 0.9 mg/dL (ref 0.2–1.2)
BUN: 16 mg/dL (ref 6–23)
CO2: 28 meq/L (ref 19–32)
CREATININE: 1.27 mg/dL (ref 0.40–1.50)
Calcium: 10.2 mg/dL (ref 8.4–10.5)
Chloride: 102 mEq/L (ref 96–112)
GFR: 71.46 mL/min (ref >60.00)
GLUCOSE: 117 mg/dL — AB (ref 70–99)
Potassium: 3.5 mEq/L (ref 3.5–5.1)
Sodium: 139 mEq/L (ref 135–145)
Total Protein: 7.5 g/dL (ref 6.0–8.3)

## 2017-05-26 LAB — LDL CHOLESTEROL, DIRECT: Direct LDL: 166 mg/dL

## 2017-05-26 LAB — VITAMIN D 25 HYDROXY (VIT D DEFICIENCY, FRACTURES): VITD: 29.85 ng/mL — ABNORMAL LOW (ref 30.00–100.00)

## 2017-05-26 LAB — HEMOGLOBIN A1C: HEMOGLOBIN A1C: 5.5 % (ref 4.6–6.5)

## 2017-05-26 LAB — PSA: PSA: 1.97 ng/mL (ref 0.10–4.00)

## 2017-05-26 MED ORDER — CHOLECALCIFEROL 50 MCG (2000 UT) PO TABS: 1.0000 | ORAL_TABLET | Freq: Every day | ORAL | 1 refills | Status: DC

## 2017-05-26 NOTE — Assessment & Plan Note (Signed)
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
## 2017-05-26 NOTE — Patient Instructions (Signed)

## 2017-06-10 ENCOUNTER — Ambulatory Visit (INDEPENDENT_AMBULATORY_CARE_PROVIDER_SITE_OTHER): Payer: Medicare Other | Admitting: *Deleted

## 2017-06-10 DIAGNOSIS — I4891 Unspecified atrial fibrillation: Secondary | ICD-10-CM | POA: Diagnosis not present

## 2017-06-10 DIAGNOSIS — Z5181 Encounter for therapeutic drug level monitoring: Secondary | ICD-10-CM

## 2017-06-10 LAB — POCT INR: INR: 2.8

## 2017-06-10 NOTE — Patient Instructions (Signed)
Description   Continue taking 1.5 tablets everyday except 1 tablet on Mondays, Wednesdays, and Fridays.  Recheck in 6 weeks .

## 2017-06-28 ENCOUNTER — Encounter: Payer: Self-pay | Admitting: Cardiology

## 2017-06-30 ENCOUNTER — Other Ambulatory Visit: Payer: Self-pay | Admitting: *Deleted

## 2017-06-30 DIAGNOSIS — I10 Essential (primary) hypertension: Secondary | ICD-10-CM

## 2017-06-30 MED ORDER — LISINOPRIL 40 MG PO TABS: 40.0000 mg | ORAL_TABLET | Freq: Every day | ORAL | 3 refills | Status: DC

## 2017-06-30 MED ORDER — LOSARTAN POTASSIUM 100 MG PO TABS: 100.0000 mg | ORAL_TABLET | Freq: Every day | ORAL | 3 refills | Status: DC

## 2017-07-10 DIAGNOSIS — I10 Essential (primary) hypertension: Secondary | ICD-10-CM | POA: Diagnosis not present

## 2017-07-11 LAB — BASIC METABOLIC PANEL
BUN/Creatinine Ratio: 12 (ref 10–24)
BUN: 16 mg/dL (ref 8–27)
CALCIUM: 9.9 mg/dL (ref 8.6–10.2)
CHLORIDE: 100 mmol/L (ref 96–106)
CO2: 24 mmol/L (ref 20–29)
Creatinine, Ser: 1.32 mg/dL — ABNORMAL HIGH (ref 0.76–1.27)
GFR calc non Af Amer: 53 mL/min/{1.73_m2} — ABNORMAL LOW (ref >59)
GFR, EST AFRICAN AMERICAN: 61 mL/min/{1.73_m2} (ref >59)
Glucose: 136 mg/dL — ABNORMAL HIGH (ref 65–99)
Potassium: 4.2 mmol/L (ref 3.5–5.2)
Sodium: 140 mmol/L (ref 134–144)

## 2017-07-12 ENCOUNTER — Encounter: Payer: Self-pay | Admitting: Cardiology

## 2017-07-22 ENCOUNTER — Ambulatory Visit (INDEPENDENT_AMBULATORY_CARE_PROVIDER_SITE_OTHER): Payer: Medicare Other

## 2017-07-22 DIAGNOSIS — Z5181 Encounter for therapeutic drug level monitoring: Secondary | ICD-10-CM

## 2017-07-22 DIAGNOSIS — I4891 Unspecified atrial fibrillation: Secondary | ICD-10-CM

## 2017-07-22 LAB — POCT INR: INR: 2.5

## 2017-07-22 NOTE — Patient Instructions (Signed)
Description   Continue on same dosage 1.5 tablets everyday except 1 tablet on Mondays, Wednesdays, and Fridays.  Recheck in 6 weeks.      

## 2017-07-25 ENCOUNTER — Other Ambulatory Visit: Payer: Self-pay | Admitting: Cardiology

## 2017-07-25 DIAGNOSIS — I10 Essential (primary) hypertension: Secondary | ICD-10-CM

## 2017-07-25 NOTE — Telephone Encounter (Signed)
Rx request sent to pharmacy.  

## 2017-08-05 ENCOUNTER — Encounter: Payer: Self-pay | Admitting: Family

## 2017-08-05 ENCOUNTER — Telehealth: Payer: Self-pay | Admitting: Internal Medicine

## 2017-08-05 ENCOUNTER — Ambulatory Visit (INDEPENDENT_AMBULATORY_CARE_PROVIDER_SITE_OTHER): Payer: Medicare Other | Admitting: Family

## 2017-08-05 VITALS — BP 132/82 | HR 78 | Temp 97.7°F | Ht 65.0 in | Wt 174.0 lb

## 2017-08-05 DIAGNOSIS — G8929 Other chronic pain: Secondary | ICD-10-CM | POA: Diagnosis not present

## 2017-08-05 DIAGNOSIS — M5441 Lumbago with sciatica, right side: Secondary | ICD-10-CM | POA: Diagnosis not present

## 2017-08-05 DIAGNOSIS — N400 Enlarged prostate without lower urinary tract symptoms: Secondary | ICD-10-CM

## 2017-08-05 NOTE — Progress Notes (Signed)
[name] is a 72 y.o. male with the following history as recorded in [employer]:  Patient Active Problem List   Diagnosis Date Noted  . Atherosclerosis of aorta ([place]) 05/26/2017  . Tinea cruris 09/15/2014  . Encounter for therapeutic drug monitoring 04/20/2013  . Constipation, chronic 10/21/2012  . Prostate nodule without urinary obstruction 04/20/2012  . Routine general medical examination at a health care facility 04/20/2012  . Prediabetes 06/05/2011  . Cerebrovascular disease 07/03/2010  . Hypothyroidism 04/26/2009  . Vitamin D deficiency 04/26/2009  . Hyperlipidemia with target LDL less than 70 12/05/2008  . DECREASED HEARING, BILATERAL 06/29/2008  . 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 CAD (coronary artery disease), native coronary artery 05/27/2008  . Atrial fibrillation ([place]) 05/27/2008  . Essential hypertension 09/23/2006    Current Outpatient Medications  Medication Sig Dispense Refill  . Calcium Carbonate-Vitamin D (CALCIUM-VITAMIN D) 600-200 MG-UNIT CAPS Take 1 tablet by mouth 2 (two) times daily.     . Cholecalciferol 2000 units TABS Take 1 tablet (2,000 Units total) by mouth daily. 90 tablet 1  . diltiazem (CARDIZEM CD) 300 MG 24 hr capsule TAKE 1 CAPSULE BY MOUTH ONCE DAILY 90 capsule 3  . dorzolamide-timolol (COSOPT) 22.3-6.8 MG/ML ophthalmic solution Place 1 drop into both eyes 3 (three) times daily.   1  . losartan (COZAAR) 100 MG tablet Take 1 tablet (100 mg total) by mouth daily. 90 tablet 3  . Magnesium 300 MG CAPS Take 1 capsule by mouth daily.      . tamsulosin (FLOMAX) 0.4 MG CAPS capsule take 1 capsule by mouth once daily 90 capsule 3  . vitamin E 400 UNIT capsule Take 400 Units by mouth daily.      Marland Kitchen warfarin (COUMADIN) 5 MG tablet take 1 to 1.5 tablets by mouth once daily as directed BY [facility] 135 tablet 0   No current facility-administered medications for this visit.     Allergies: Statins; Lipitor [atorvastatin calcium]; and Lovastatin  Past  Medical History:  Diagnosis Date  . 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 Arthritis   . Atrial fibrillation ([place])   . Cataract    right eye   . Glaucoma   . Hard of hearing   . Hemorrhoid   . Hx of adenomatous colonic polyps 07/2008  . Hyperlipidemia   . Hypertension     Past Surgical History:  Procedure Laterality Date  . COLONOSCOPY    . POLYPECTOMY    . STAPEDES SURGERY      Family History  Problem Relation Age of Onset  . Heart disease Mother   . Cancer Neg Hx   . Stroke Neg Hx   . Hypertension Neg Hx   . Hyperlipidemia Neg Hx   . Diabetes Neg Hx   . Esophageal cancer Neg Hx   . Colon cancer Neg Hx   . Pancreatic cancer Neg Hx   . Stomach cancer Neg Hx     Social History   Tobacco Use  . Smoking status: Former Research scientist (life sciences)  . Smokeless tobacco: Never Used  . Tobacco comment: quit x 35 years ago  Substance Use Topics  . Alcohol use: No    Alcohol/week: 0.0 oz    Subjective:   Patient presents today with numerous concerns; wife is present; majority of his symptoms have been present for at least 6 months/ some for years in nature; patient is a difficult historian;  Patient presents with concerns for  feeling "full and bloated" x 1 month; no nausea, vomiting; no symptoms of acid reflux or heartburn; unable to determine any specific triggers. Has  taken Zantac in the past; Is adamant that his symptoms are not related to acid reflux; is adamant that he does not want any type of medication.   Complaining of low back pain/ urinary frequency- is on Flomax; still having nighttime symptoms; is very concerned about his prostate. Knows that his prostate looked enlarged on CT that was done in December 2018; wonders what type of follow-up needs to be done.  Complaining of back pain that has been present x 20-30 years; cannot due MRI due cochlear implant; CT done in December 2018 showed arthritis changes. He is concerned that  one leg is shorter than the other and wonders about some type of orthotic; does not want to consider PT; would be interested in seeing specialist.      Objective:  Vitals:   08/05/17 1602  BP: 132/82  Pulse: 78  Temp: 97.7 F (36.5 C)  TempSrc: Oral  SpO2: 97%  Weight: 174 lb 0.6 oz (78.9 kg)  Height: 5\' 5"  (1.651 m)    General: Well developed, well nourished, in no acute distress  Skin : Warm and dry.  Head: Normocephalic and atraumatic  Lungs: Respirations unlabored; clear to auscultation bilaterally without wheeze, rales, rhonchi  Abdomen: Soft; nontender; nondistended; normoactive bowel sounds; no masses or hepatosplenomegaly  Musculoskeletal: No deformities; no active joint inflammation  Extremities: No edema, cyanosis, clubbing  Vessels: Symmetric bilaterally  Neurologic: Alert and oriented; speech intact; face symmetrical; moves all extremities well; CNII-XII intact without focal deficit   Assessment:  1. Chronic right-sided low back pain with right-sided sciatica   2. Enlarged prostate     Plan:  1. Refer to neurosurgeon to discuss treatment options; 2. Refer to urology to discuss treatment options; 3. Suspect GERD- patient defers treatment; encouraged healthy eating/ weight loss; colonoscopy is up to date;   Spent 30 minutes with patient; greater than 50% spent in counseling;   No follow-ups on file.  Orders Placed This Encounter  Procedures  . Ambulatory referral to Neurosurgery    Referral Priority:   Routine    Referral Type:   Surgical    Referral Reason:   Specialty Services Required    Requested Specialty:   Neurosurgery    Number of Visits Requested:   1  . Ambulatory referral to Urology    Referral Priority:   Routine    Referral Type:   Consultation    Referral Reason:   Specialty Services Required    Requested Specialty:   Urology    Number of Visits Requested:   1    Requested Prescriptions    No prescriptions requested or ordered in this  encounter

## 2017-08-05 NOTE — Telephone Encounter (Signed)
Spouse spoke with [facility] on 5/25 stating that patient was having lower back and mid to lower abdomen pain.  Denied vomiting, nausea or any other symptoms.  States pain is a 5 out of a 10.  Was advised to see MD within 24 hours.

## 2017-08-05 NOTE — Telephone Encounter (Signed)
I have scheduled patient to see Valere Dross this afternoon.

## 2017-08-11 ENCOUNTER — Other Ambulatory Visit: Payer: Self-pay | Admitting: Cardiology

## 2017-09-02 ENCOUNTER — Ambulatory Visit (INDEPENDENT_AMBULATORY_CARE_PROVIDER_SITE_OTHER): Payer: Medicare Other | Admitting: *Deleted

## 2017-09-02 DIAGNOSIS — I4891 Unspecified atrial fibrillation: Secondary | ICD-10-CM

## 2017-09-02 DIAGNOSIS — Z5181 Encounter for therapeutic drug level monitoring: Secondary | ICD-10-CM

## 2017-09-02 LAB — POCT INR: INR: 2.3 (ref 2.0–3.0)

## 2017-09-02 NOTE — Patient Instructions (Signed)
Description   Continue on same dosage 1.5 tablets everyday except 1 tablet on Mondays, Wednesdays, and Fridays.  Recheck in 6 weeks.      

## 2017-09-15 DIAGNOSIS — G8929 Other chronic pain: Secondary | ICD-10-CM | POA: Diagnosis not present

## 2017-09-15 DIAGNOSIS — M5441 Lumbago with sciatica, right side: Secondary | ICD-10-CM | POA: Diagnosis not present

## 2017-09-15 DIAGNOSIS — Z6828 Body mass index (BMI) 28.0-28.9, adult: Secondary | ICD-10-CM | POA: Diagnosis not present

## 2017-09-15 DIAGNOSIS — I10 Essential (primary) hypertension: Secondary | ICD-10-CM | POA: Diagnosis not present

## 2017-09-17 ENCOUNTER — Other Ambulatory Visit: Payer: Self-pay | Admitting: Physician Assistant

## 2017-09-17 ENCOUNTER — Encounter: Payer: Self-pay | Admitting: Internal Medicine

## 2017-09-17 ENCOUNTER — Ambulatory Visit (INDEPENDENT_AMBULATORY_CARE_PROVIDER_SITE_OTHER): Payer: Medicare Other | Admitting: Internal Medicine

## 2017-09-17 ENCOUNTER — Other Ambulatory Visit: Payer: Self-pay | Admitting: Internal Medicine

## 2017-09-17 ENCOUNTER — Other Ambulatory Visit (INDEPENDENT_AMBULATORY_CARE_PROVIDER_SITE_OTHER): Payer: Medicare Other

## 2017-09-17 VITALS — BP 160/90 | HR 65 | Temp 98.6°F | Resp 16 | Ht 65.0 in | Wt 171.8 lb

## 2017-09-17 DIAGNOSIS — E559 Vitamin D deficiency, unspecified: Secondary | ICD-10-CM

## 2017-09-17 DIAGNOSIS — I10 Essential (primary) hypertension: Secondary | ICD-10-CM

## 2017-09-17 DIAGNOSIS — I2510 Atherosclerotic heart disease of native coronary artery without angina pectoris: Secondary | ICD-10-CM | POA: Diagnosis not present

## 2017-09-17 DIAGNOSIS — M5441 Lumbago with sciatica, right side: Principal | ICD-10-CM

## 2017-09-17 DIAGNOSIS — N402 Nodular prostate without lower urinary tract symptoms: Secondary | ICD-10-CM

## 2017-09-17 DIAGNOSIS — E039 Hypothyroidism, unspecified: Secondary | ICD-10-CM

## 2017-09-17 DIAGNOSIS — L309 Dermatitis, unspecified: Secondary | ICD-10-CM

## 2017-09-17 DIAGNOSIS — R7303 Prediabetes: Secondary | ICD-10-CM

## 2017-09-17 DIAGNOSIS — G8929 Other chronic pain: Secondary | ICD-10-CM

## 2017-09-17 LAB — URINALYSIS, ROUTINE W REFLEX MICROSCOPIC
BILIRUBIN URINE: NEGATIVE
Hgb urine dipstick: NEGATIVE
KETONES UR: NEGATIVE
Leukocytes, UA: NEGATIVE
Nitrite: NEGATIVE
PH: 7 (ref 5.0–8.0)
RBC / HPF: NONE SEEN (ref 0–?)
Specific Gravity, Urine: 1.015 (ref 1.000–1.030)
TOTAL PROTEIN, URINE-UPE24: NEGATIVE
UROBILINOGEN UA: 0.2 (ref 0.0–1.0)
Urine Glucose: NEGATIVE
WBC, UA: NONE SEEN (ref 0–?)

## 2017-09-17 LAB — BASIC METABOLIC PANEL
BUN: 16 mg/dL (ref 6–23)
CHLORIDE: 103 meq/L (ref 96–112)
CO2: 33 meq/L — AB (ref 19–32)
CREATININE: 1.28 mg/dL (ref 0.40–1.50)
Calcium: 10.2 mg/dL (ref 8.4–10.5)
GFR: 70.75 mL/min (ref >60.00)
GLUCOSE: 111 mg/dL — AB (ref 70–99)
Potassium: 4.1 mEq/L (ref 3.5–5.1)
SODIUM: 141 meq/L (ref 135–145)

## 2017-09-17 LAB — HEMOGLOBIN A1C: HEMOGLOBIN A1C: 5.6 % (ref 4.6–6.5)

## 2017-09-17 LAB — VITAMIN D 25 HYDROXY (VIT D DEFICIENCY, FRACTURES): VITD: 33.33 ng/mL (ref 30.00–100.00)

## 2017-09-17 LAB — TSH: TSH: 4.87 u[IU]/mL — ABNORMAL HIGH (ref 0.35–4.50)

## 2017-09-17 MED ORDER — FLUOCINONIDE-E 0.05 % EX CREA: 1.0000 "application " | TOPICAL_CREAM | Freq: Two times a day (BID) | CUTANEOUS | 0 refills | Status: DC

## 2017-09-17 MED ORDER — AZILSARTAN MEDOXOMIL 40 MG PO TABS: 1.0000 | ORAL_TABLET | Freq: Every day | ORAL | 0 refills | Status: DC

## 2017-09-17 MED ORDER — CLOBETASOL PROPIONATE 0.05 % EX OINT: 1.0000 "application " | TOPICAL_OINTMENT | Freq: Two times a day (BID) | CUTANEOUS | 0 refills | Status: DC

## 2017-09-17 NOTE — Patient Instructions (Signed)
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
## 2017-09-18 ENCOUNTER — Telehealth: Payer: Self-pay | Admitting: Nurse Practitioner

## 2017-09-18 ENCOUNTER — Encounter: Payer: Self-pay | Admitting: Internal Medicine

## 2017-09-18 ENCOUNTER — Telehealth: Payer: Self-pay

## 2017-09-18 NOTE — Telephone Encounter (Signed)
Phone call to patient to verify medication list and allergies for myelogram procedure. Pt informed he will need to hold coumadin for 4 days (pending cardiologist Dr Stanford Breed approval) and need an INR prior to procedure. Pt verbalized understanding. Blood thinner hold request faxed to Dr Stanford Breed, awaiting approval.

## 2017-09-18 NOTE — Telephone Encounter (Signed)
   [facility] Pre-operative Risk Assessment    Request for surgical clearance:  1. What type of surgery is being performed? Myelogram  2. When is this surgery scheduled? TBD  3. What type of clearance is required (medical clearance vs. Pharmacy clearance to hold med vs. Both)? Both  4. Are there any medications that need to be held prior to surgery and how long? Coumadin  5. Practice name and name of physician performing surgery? [facility]   6. What is your office phone number [phone]    7.   What is your office fax number [phone]  8.   Anesthesia type (None, local, MAC, general) ? Unspecified    Ena Dawley 09/18/2017, 3:58 PM  _________________________________________________________________   (provider comments below)

## 2017-09-19 NOTE — Telephone Encounter (Signed)
Pt takes warfarin for afib with CHADS2VASc score of 3 (age, HTN, CAD). 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 Will defer to MD to see if pt is able to hold warfarin for 5 days or if he will need a Lovenox bridge.

## 2017-09-21 NOTE — Telephone Encounter (Signed)
lovenox bridge needed [employer]

## 2017-09-22 NOTE — Telephone Encounter (Signed)
Faxing recommendations to [facility].  Burtis Junes, RN, [place] [address] [place], [place]  [id number] [phone]

## 2017-09-22 NOTE — Telephone Encounter (Signed)
Spoke with patient's wife to let her know we will need to see pt 1 week prior to myelogram in clinic to coordinate Lovenox bridge. Procedure date is not set yet. She is aware to call clinic so that we can arrange Lovenox bridge prior to myelogram.

## 2017-10-01 NOTE — Telephone Encounter (Signed)
Called pt to follow up with coordinating Lovenox bridge. He states he will not be having his procedure any longer.

## 2017-10-03 ENCOUNTER — Other Ambulatory Visit: Payer: Medicare Other

## 2017-10-14 ENCOUNTER — Ambulatory Visit (INDEPENDENT_AMBULATORY_CARE_PROVIDER_SITE_OTHER): Payer: Medicare Other | Admitting: *Deleted

## 2017-10-14 DIAGNOSIS — Z5181 Encounter for therapeutic drug level monitoring: Secondary | ICD-10-CM | POA: Diagnosis not present

## 2017-10-14 DIAGNOSIS — I4891 Unspecified atrial fibrillation: Secondary | ICD-10-CM

## 2017-10-14 LAB — POCT INR: INR: 2.1 (ref 2.0–3.0)

## 2017-10-14 NOTE — Patient Instructions (Signed)
Description   Continue on same dosage 1.5 tablets everyday except 1 tablet on Mondays, Wednesdays, and Fridays.  Recheck in 6 weeks.      

## 2017-10-20 ENCOUNTER — Ambulatory Visit: Payer: Medicare Other

## 2017-10-20 ENCOUNTER — Telehealth: Payer: Self-pay

## 2017-10-20 ENCOUNTER — Other Ambulatory Visit: Payer: Self-pay | Admitting: Internal Medicine

## 2017-10-20 DIAGNOSIS — I10 Essential (primary) hypertension: Secondary | ICD-10-CM

## 2017-10-20 MED ORDER — AZILSARTAN-CHLORTHALIDONE 40-12.5 MG PO TABS: 1.0000 | ORAL_TABLET | Freq: Every day | ORAL | 0 refills | Status: DC

## 2017-10-20 NOTE — Telephone Encounter (Signed)
Change to [employer] sent

## 2017-10-20 NOTE — Telephone Encounter (Signed)
Patient advised of dr Ronnald Ramp note/instructions, he has already taken samples of this med home with him today and will begin using

## 2017-10-20 NOTE — Telephone Encounter (Signed)
Patient was started on Edarbi about a month ago and came in today (nurse visit) to have bp rechecked---today's reading was 168/96, asymptomatic---please advise if you want to make any further changes to medications, I will call patient back at [phone]

## 2017-11-15 ENCOUNTER — Other Ambulatory Visit: Payer: Self-pay | Admitting: Cardiology

## 2017-11-25 ENCOUNTER — Other Ambulatory Visit: Payer: Self-pay | Admitting: Internal Medicine

## 2017-11-25 ENCOUNTER — Ambulatory Visit (INDEPENDENT_AMBULATORY_CARE_PROVIDER_SITE_OTHER): Payer: Medicare Other | Admitting: *Deleted

## 2017-11-25 DIAGNOSIS — Z5181 Encounter for therapeutic drug level monitoring: Secondary | ICD-10-CM | POA: Diagnosis not present

## 2017-11-25 DIAGNOSIS — I4891 Unspecified atrial fibrillation: Secondary | ICD-10-CM

## 2017-11-25 DIAGNOSIS — E559 Vitamin D deficiency, unspecified: Secondary | ICD-10-CM

## 2017-11-25 LAB — POCT INR: INR: 1.7 — AB (ref 2.0–3.0)

## 2017-11-25 NOTE — Patient Instructions (Addendum)
Description   Today take an extra 1/2 tablet, then Continue on same dosage 1.5 tablets everyday except 1 tablet on Mondays, Wednesdays, and Fridays.  Recheck in 4 weeks.

## 2017-12-13 ENCOUNTER — Other Ambulatory Visit: Payer: Self-pay | Admitting: Internal Medicine

## 2017-12-13 DIAGNOSIS — N402 Nodular prostate without lower urinary tract symptoms: Secondary | ICD-10-CM

## 2017-12-23 ENCOUNTER — Ambulatory Visit (INDEPENDENT_AMBULATORY_CARE_PROVIDER_SITE_OTHER): Payer: Medicare Other | Admitting: *Deleted

## 2017-12-23 DIAGNOSIS — Z5181 Encounter for therapeutic drug level monitoring: Secondary | ICD-10-CM | POA: Diagnosis not present

## 2017-12-23 DIAGNOSIS — I4891 Unspecified atrial fibrillation: Secondary | ICD-10-CM

## 2017-12-23 LAB — POCT INR: INR: 2 (ref 2.0–3.0)

## 2017-12-23 NOTE — Patient Instructions (Addendum)
  Description   Continue on same dosage 1.5 tablets everyday except 1 tablet on Mondays, Wednesdays, and Fridays.  Recheck in 6 weeks.      

## 2018-02-03 ENCOUNTER — Ambulatory Visit (INDEPENDENT_AMBULATORY_CARE_PROVIDER_SITE_OTHER): Payer: Medicare Other

## 2018-02-03 DIAGNOSIS — I4891 Unspecified atrial fibrillation: Secondary | ICD-10-CM

## 2018-02-03 DIAGNOSIS — Z5181 Encounter for therapeutic drug level monitoring: Secondary | ICD-10-CM | POA: Diagnosis not present

## 2018-02-03 LAB — POCT INR: INR: 1.9 — AB (ref 2.0–3.0)

## 2018-02-03 NOTE — Patient Instructions (Signed)
Please take extra 1/2 tablet today, then continue on same dosage 1.5 tablets everyday except 1 tablet on Mondays, Wednesdays, and Fridays.  Recheck in 6 weeks.

## 2018-03-02 ENCOUNTER — Ambulatory Visit (INDEPENDENT_AMBULATORY_CARE_PROVIDER_SITE_OTHER): Payer: Medicare Other | Admitting: Pharmacist

## 2018-03-02 DIAGNOSIS — I4891 Unspecified atrial fibrillation: Secondary | ICD-10-CM | POA: Diagnosis not present

## 2018-03-02 DIAGNOSIS — Z5181 Encounter for therapeutic drug level monitoring: Secondary | ICD-10-CM

## 2018-03-02 LAB — POCT INR: INR: 2.5 (ref 2.0–3.0)

## 2018-03-02 NOTE — Patient Instructions (Signed)
Description   Continue on same dosage 1.5 tablets everyday except 1 tablet on Mondays, Wednesdays, and Fridays.  Recheck in 6 weeks.

## 2018-04-14 ENCOUNTER — Ambulatory Visit (INDEPENDENT_AMBULATORY_CARE_PROVIDER_SITE_OTHER): Payer: Medicare Other | Admitting: *Deleted

## 2018-04-14 DIAGNOSIS — I4891 Unspecified atrial fibrillation: Secondary | ICD-10-CM | POA: Diagnosis not present

## 2018-04-14 DIAGNOSIS — Z5181 Encounter for therapeutic drug level monitoring: Secondary | ICD-10-CM | POA: Diagnosis not present

## 2018-04-14 LAB — POCT INR: INR: 2.1 (ref 2.0–3.0)

## 2018-04-14 NOTE — Patient Instructions (Signed)
Description   Continue on same dosage 1.5 tablets everyday except 1 tablet on Mondays, Wednesdays, and Fridays.  Recheck in 6 weeks.

## 2018-04-24 NOTE — Progress Notes (Signed)
HPI: FU Atrial fibrillation; history of an acute anterior myocardial infarction occurring in the setting of atrial fibrillation, felt secondary to an embolic event. His LV function is preserved. We have been treating with rate control and anticoagulation at his request. His previous cardiac catheterization in August of 2009 revealed an occluded LAD but no other obstructive disease. The LAD occlusion was felt secondary to an embolus and resolved with thrombus aspiration. Abdominal ultrasound in October 2014 showed no aneurysm. Echocardiogram repeated in November 2014 and showed normal LV function, mild left ventricular hypertrophy and moderate left atrial enlargement. Carotid Dopplers in Nov 2015 showed no significant stenosis.    Abdominal CT December 2018 showed soft tissue prominence along the superior aspect of the prostate and findings warranted clinical exam and PSA.  At previous office visit I asked patient to follow-up with primary care for this issue.  Since I last saw him,  Current Outpatient Medications  Medication Sig Dispense Refill  . Azilsartan-Chlorthalidone (EDARBYCLOR) 40-12.5 MG TABS Take 1 tablet by mouth daily. 90 tablet 0  . Cholecalciferol (VITAMIN D3) 2000 units TABS TAKE 1 TABLET BY MOUTH DAILY 90 tablet 1  . diltiazem (CARDIZEM CD) 300 MG 24 hr capsule TAKE 1 CAPSULE BY MOUTH ONCE DAILY 90 capsule 3  . dorzolamide-timolol (COSOPT) 22.3-6.8 MG/ML ophthalmic solution Place 1 drop into both eyes 3 (three) times daily.   1  . fluocinonide-emollient (LIDEX-E) 0.05 % cream Apply 1 application topically 2 (two) times daily. 60 g 0  . Magnesium 300 MG CAPS Take 1 capsule by mouth daily.      . tamsulosin (FLOMAX) 0.4 MG CAPS capsule TAKE 1 CAPSULE BY MOUTH EVERY DAY 90 capsule 1  . vitamin E 400 UNIT capsule Take 400 Units by mouth daily.      Marland Kitchen warfarin (COUMADIN) 5 MG tablet TAKE 1 AND 1/2 TABLETS BY MOUTH DAILY AS NEEDED 135 tablet 1   No current facility-administered  medications for this visit.      Past Medical History:  Diagnosis Date  . Anterior myocardial infarction [facility])    Presumed secondary to embolus from atrial fibrillation  . Arthritis   . Atrial fibrillation ([place])   . Cataract    right eye   . Glaucoma   . Hard of hearing   . Hemorrhoid   . Hx of adenomatous colonic polyps 07/2008  . Hyperlipidemia   . Hypertension     Past Surgical History:  Procedure Laterality Date  . COLONOSCOPY    . POLYPECTOMY    . STAPEDES SURGERY      Social History   Socioeconomic History  . Marital status: Married    Spouse name: Not on file  . Number of children: 6  . Years of education: Not on file  . Highest education level: Not on file  Occupational History  . Occupation: Retired    Fish farm manager: RETIRED  Social Needs  . Financial resource strain: Not on file  . Food insecurity:    Worry: Not on file    Inability: Not on file  . Transportation needs:    Medical: Not on file    Non-medical: Not on file  Tobacco Use  . Smoking status: Former Research scientist (life sciences)  . Smokeless tobacco: Never Used  . Tobacco comment: quit x 35 years ago  Substance and Sexual Activity  . Alcohol use: No    Alcohol/week: 0.0 standard drinks  . Drug use: No  . Sexual activity: Never  Lifestyle  .  Physical activity:    Days per week: Not on file    Minutes per session: Not on file  . Stress: Not on file  Relationships  . Social connections:    Talks on phone: Not on file    Gets together: Not on file    Attends religious service: Not on file    Active member of club or organization: Not on file    Attends meetings of clubs or organizations: Not on file    Relationship status: Not on file  . Intimate partner violence:    Fear of current or ex partner: Not on file    Emotionally abused: Not on file    Physically abused: Not on file    Forced sexual activity: Not on file  Other Topics Concern  . Not on file  Social History Narrative   Lives with spouse     Family History  Problem Relation Age of Onset  . Heart disease Mother   . Cancer Neg Hx   . Stroke Neg Hx   . Hypertension Neg Hx   . Hyperlipidemia Neg Hx   . Diabetes Neg Hx   . Esophageal cancer Neg Hx   . Colon cancer Neg Hx   . Pancreatic cancer Neg Hx   . Stomach cancer Neg Hx     ROS: no fevers or chills, productive cough, hemoptysis, dysphasia, odynophagia, melena, hematochezia, dysuria, hematuria, rash, seizure activity, orthopnea, PND, pedal edema, claudication. Remaining systems are negative.  Physical Exam: Well-developed well-nourished in no acute distress.  Skin is warm and dry.  HEENT is normal.  Neck is supple.  Chest is clear to auscultation with normal expansion.  Cardiovascular exam is irregular Abdominal exam nontender or distended. No masses palpated. Extremities show no edema. neuro grossly intact  ECG-atrial fibrillation at a rate of 97, nonspecific ST changes.  Personally reviewed  A/P  1 permanent atrial fibrillation-patient remains asymptomatic.  We are treating with rate control and anticoagulation.  Continue Cardizem and Coumadin.  We have discussed DOACs previously and he is now willing to consider if affordable.  I have provided the names of apixaban and Xarelto today.  He will contact us if he is willing to change.  Check hemoglobin.  2 hypertension-blood pressure is controlled.  Continue present medications and follow.  Check potassium and renal function.  3 hyperlipidemia-patient is intolerant to statins.  Plan to continue diet.  Kirk Ruths, MD

## 2018-05-08 ENCOUNTER — Encounter: Payer: Self-pay | Admitting: Cardiology

## 2018-05-08 ENCOUNTER — Ambulatory Visit (INDEPENDENT_AMBULATORY_CARE_PROVIDER_SITE_OTHER): Payer: Medicare Other | Admitting: Cardiology

## 2018-05-08 VITALS — BP 140/90 | HR 97 | Ht 65.0 in | Wt 166.0 lb

## 2018-05-08 DIAGNOSIS — I4821 Permanent atrial fibrillation: Secondary | ICD-10-CM | POA: Diagnosis not present

## 2018-05-08 DIAGNOSIS — E7800 Pure hypercholesterolemia, unspecified: Secondary | ICD-10-CM

## 2018-05-08 DIAGNOSIS — I10 Essential (primary) hypertension: Secondary | ICD-10-CM | POA: Diagnosis not present

## 2018-05-08 NOTE — Patient Instructions (Signed)
Medication Instructions:  [place] TO REPLACE WARFARIN If you need a refill on your cardiac medications before your next appointment, please call your pharmacy.   Lab work: Your physician recommends that you HAVE LAB WORK TODAY If you have labs (blood work) drawn today and your tests are completely normal, you will receive your results only by: Marland Kitchen MyChart Message (if you have MyChart) OR . A paper copy in the mail If you have any lab test that is abnormal or we need to change your treatment, we will call you to review the results.  Follow-Up: At [facility], you and your health needs are our priority.  As part of our continuing mission to provide you with exceptional heart care, we have created designated Provider Care Teams.  These Care Teams include your primary Cardiologist (physician) and Advanced Practice Providers (APPs -  Physician Assistants and Nurse Practitioners) who all work together to provide you with the care you need, when you need it. You will need a follow up appointment in 12 months.  Please call our office 2 months in advance to schedule this appointment.  You may see Kirk Ruths MD or one of the following Advanced Practice Providers on your designated Care Team:   Kerin Ransom, PA-C Roby Lofts, [place] . Sande Rives, PA-C

## 2018-05-09 LAB — CBC
HEMOGLOBIN: 15.3 g/dL (ref 13.0–17.7)
Hematocrit: 45.9 % (ref 37.5–51.0)
MCH: 32.6 pg (ref 26.6–33.0)
MCHC: 33.3 g/dL (ref 31.5–35.7)
MCV: 98 fL — ABNORMAL HIGH (ref 79–97)
PLATELETS: 195 10*3/uL (ref 150–450)
RBC: 4.7 x10E6/uL (ref 4.14–5.80)
RDW: 11.6 % (ref 11.6–15.4)
WBC: 7.2 10*3/uL (ref 3.4–10.8)

## 2018-05-09 LAB — BASIC METABOLIC PANEL
BUN/Creatinine Ratio: 11 (ref 10–24)
BUN: 13 mg/dL (ref 8–27)
CALCIUM: 9.6 mg/dL (ref 8.6–10.2)
CO2: 24 mmol/L (ref 20–29)
Chloride: 103 mmol/L (ref 96–106)
Creatinine, Ser: 1.17 mg/dL (ref 0.76–1.27)
GFR calc non Af Amer: 61 mL/min/{1.73_m2} (ref >59)
GFR, EST AFRICAN AMERICAN: 71 mL/min/{1.73_m2} (ref >59)
Glucose: 104 mg/dL — ABNORMAL HIGH (ref 65–99)
POTASSIUM: 3.6 mmol/L (ref 3.5–5.2)
SODIUM: 142 mmol/L (ref 134–144)

## 2018-05-11 NOTE — Addendum Note (Signed)
Addended by: Venetia Maxon on: 05/11/2018 05:21 PM   Modules accepted: Orders

## 2018-05-12 ENCOUNTER — Other Ambulatory Visit: Payer: Self-pay | Admitting: Cardiology

## 2018-05-14 ENCOUNTER — Telehealth: Payer: Self-pay | Admitting: Cardiology

## 2018-05-14 NOTE — Telephone Encounter (Signed)
New Message   Pt c/o medication issue:  1. Name of Medication: Diltiazem   2. How are you currently taking this medication (dosage and times per day)? 300mg    3. Are you having a reaction (difficulty breathing--STAT)? No  4. What is your medication issue? Pt says this medication is not helping him with his BP   BP  140/90   Pt would also like to start taking Eliquis

## 2018-05-14 NOTE — Telephone Encounter (Signed)
LMTCB

## 2018-05-15 NOTE — Telephone Encounter (Signed)
[id number] 

## 2018-05-19 NOTE — Telephone Encounter (Signed)
Left message for pt to call.

## 2018-05-19 NOTE — Telephone Encounter (Signed)
Follow up     Pts wife is returning call   Please call back  

## 2018-05-20 NOTE — Telephone Encounter (Signed)
Patient returned call, stating that his blood pressure has been elevated. It was 142/90, he denies CP, SOB, and swelling. Patient did advise that he had increased his salt in his diet/and not monitoring his diet. I advised wife and patient to monitor salt intake as well as make diet changes. BP at last visit was 140/90, is coming down from previous BP's in the past. Patient advised to try diet changes, and will call if BP continues to rise.  Patient also was told per last OV he could switch from Coumadin to Eliquis, is there anything he should do before this switch, or dose of Eliquis??  Please advise, thanks!

## 2018-05-20 NOTE — Telephone Encounter (Signed)
Left message to call back  

## 2018-05-20 NOTE — Telephone Encounter (Signed)
Called patient, spoke with wife. Advised message from PharmD.  Patient wife verbalized understanding.

## 2018-05-20 NOTE — Telephone Encounter (Signed)
1. Patient needs INR check prior to transition to Eliquis. Noted patient has appointment with coumadin clinic next week.  Will discuss transition to Eliquis and monitoring during visit.   2. No additional recommendation for BP at this time.

## 2018-05-26 ENCOUNTER — Other Ambulatory Visit: Payer: Self-pay

## 2018-05-26 ENCOUNTER — Ambulatory Visit (INDEPENDENT_AMBULATORY_CARE_PROVIDER_SITE_OTHER): Payer: Medicare Other | Admitting: Pharmacist

## 2018-05-26 DIAGNOSIS — Z5181 Encounter for therapeutic drug level monitoring: Secondary | ICD-10-CM | POA: Diagnosis not present

## 2018-05-26 DIAGNOSIS — I4891 Unspecified atrial fibrillation: Secondary | ICD-10-CM

## 2018-05-26 LAB — POCT INR: INR: 2.7 (ref 2.0–3.0)

## 2018-05-26 MED ORDER — APIXABAN 5 MG PO TABS: 5.0000 mg | ORAL_TABLET | Freq: Two times a day (BID) | ORAL | 3 refills | Status: DC

## 2018-05-26 NOTE — Patient Instructions (Signed)
Description   Hold your warfarin tomorrow. Then start taking your apixaban on Thursday morning. This is a twice a day medication, try to take it as close to 12 hours apart as you can.

## 2018-06-01 ENCOUNTER — Encounter: Payer: Self-pay | Admitting: Internal Medicine

## 2018-06-01 ENCOUNTER — Other Ambulatory Visit: Payer: Self-pay

## 2018-06-01 ENCOUNTER — Other Ambulatory Visit (INDEPENDENT_AMBULATORY_CARE_PROVIDER_SITE_OTHER): Payer: Medicare Other

## 2018-06-01 ENCOUNTER — Ambulatory Visit (INDEPENDENT_AMBULATORY_CARE_PROVIDER_SITE_OTHER): Payer: Medicare Other | Admitting: Internal Medicine

## 2018-06-01 VITALS — BP 142/86 | HR 81 | Temp 98.6°F | Resp 16 | Ht 65.0 in | Wt 169.5 lb

## 2018-06-01 DIAGNOSIS — I679 Cerebrovascular disease, unspecified: Secondary | ICD-10-CM | POA: Diagnosis not present

## 2018-06-01 DIAGNOSIS — E781 Pure hyperglyceridemia: Secondary | ICD-10-CM | POA: Diagnosis not present

## 2018-06-01 DIAGNOSIS — I482 Chronic atrial fibrillation, unspecified: Secondary | ICD-10-CM | POA: Diagnosis not present

## 2018-06-01 DIAGNOSIS — I10 Essential (primary) hypertension: Secondary | ICD-10-CM

## 2018-06-01 DIAGNOSIS — Z0000 Encounter for general adult medical examination without abnormal findings: Secondary | ICD-10-CM

## 2018-06-01 DIAGNOSIS — E785 Hyperlipidemia, unspecified: Secondary | ICD-10-CM

## 2018-06-01 DIAGNOSIS — E559 Vitamin D deficiency, unspecified: Secondary | ICD-10-CM

## 2018-06-01 DIAGNOSIS — E039 Hypothyroidism, unspecified: Secondary | ICD-10-CM

## 2018-06-01 DIAGNOSIS — N402 Nodular prostate without lower urinary tract symptoms: Secondary | ICD-10-CM | POA: Diagnosis not present

## 2018-06-01 DIAGNOSIS — B356 Tinea cruris: Secondary | ICD-10-CM

## 2018-06-01 DIAGNOSIS — I2510 Atherosclerotic heart disease of native coronary artery without angina pectoris: Secondary | ICD-10-CM

## 2018-06-01 DIAGNOSIS — I700 Atherosclerosis of aorta: Secondary | ICD-10-CM

## 2018-06-01 LAB — LIPID PANEL
Cholesterol: 219 mg/dL — ABNORMAL HIGH (ref 0–200)
HDL: 33.9 mg/dL — ABNORMAL LOW (ref >39.00)
NonHDL: 185.24
Total CHOL/HDL Ratio: 6
Triglycerides: 210 mg/dL — ABNORMAL HIGH (ref 0.0–149.0)
VLDL: 42 mg/dL — ABNORMAL HIGH (ref 0.0–40.0)

## 2018-06-01 LAB — TSH: TSH: 3.97 u[IU]/mL (ref 0.35–4.50)

## 2018-06-01 LAB — URINALYSIS, ROUTINE W REFLEX MICROSCOPIC
Bilirubin Urine: NEGATIVE
HGB URINE DIPSTICK: NEGATIVE
KETONES UR: NEGATIVE
Leukocytes,Ua: NEGATIVE
NITRITE: NEGATIVE
RBC / HPF: NONE SEEN (ref 0–?)
Specific Gravity, Urine: 1.015 (ref 1.000–1.030)
Total Protein, Urine: NEGATIVE
Urine Glucose: NEGATIVE
Urobilinogen, UA: 0.2 (ref 0.0–1.0)
pH: 8 (ref 5.0–8.0)

## 2018-06-01 LAB — PSA: PSA: 1.92 ng/mL (ref 0.10–4.00)

## 2018-06-01 LAB — VITAMIN D 25 HYDROXY (VIT D DEFICIENCY, FRACTURES): VITD: 35.39 ng/mL (ref 30.00–100.00)

## 2018-06-01 LAB — LDL CHOLESTEROL, DIRECT: Direct LDL: 145 mg/dL

## 2018-06-01 MED ORDER — ICOSAPENT ETHYL 1 G PO CAPS: 2.0000 | ORAL_CAPSULE | Freq: Two times a day (BID) | ORAL | 1 refills | Status: DC

## 2018-06-01 MED ORDER — CICLOPIROX 0.77 % EX GEL: 1.0000 "application " | Freq: Two times a day (BID) | CUTANEOUS | 1 refills | Status: DC
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
## 2018-06-01 NOTE — Assessment & Plan Note (Signed)

## 2018-06-01 NOTE — Patient Instructions (Signed)

## 2018-06-11 ENCOUNTER — Other Ambulatory Visit: Payer: Self-pay | Admitting: Internal Medicine

## 2018-06-11 DIAGNOSIS — N402 Nodular prostate without lower urinary tract symptoms: Secondary | ICD-10-CM

## 2018-07-03 ENCOUNTER — Other Ambulatory Visit: Payer: Self-pay | Admitting: Cardiology

## 2018-07-03 DIAGNOSIS — I10 Essential (primary) hypertension: Secondary | ICD-10-CM

## 2018-07-03 NOTE — Telephone Encounter (Signed)
Diltiazem CD 300 mg refilled.

## 2018-08-13 ENCOUNTER — Other Ambulatory Visit: Payer: Self-pay | Admitting: Internal Medicine

## 2018-08-13 DIAGNOSIS — L309 Dermatitis, unspecified: Secondary | ICD-10-CM

## 2018-08-13 MED ORDER — CLOBETASOL PROPIONATE 0.05 % EX OINT: 1.0000 "application " | TOPICAL_OINTMENT | Freq: Two times a day (BID) | CUTANEOUS | 1 refills | Status: DC

## 2018-08-14 ENCOUNTER — Other Ambulatory Visit: Payer: Self-pay | Admitting: Internal Medicine

## 2018-08-14 DIAGNOSIS — L2084 Intrinsic (allergic) eczema: Secondary | ICD-10-CM

## 2018-08-14 MED ORDER — TRIAMCINOLONE ACETONIDE 0.5 % EX CREA: 1.0000 "application " | TOPICAL_CREAM | Freq: Two times a day (BID) | CUTANEOUS | 2 refills | Status: DC

## 2018-08-21 ENCOUNTER — Telehealth: Payer: Self-pay | Admitting: *Deleted

## 2018-08-21 NOTE — Telephone Encounter (Signed)
    COVID-19 Pre-Screening Questions:  . In the past 7 to 10 days have you had a cough,  shortness of breath, headache, congestion, fever (100 or greater) body aches, chills, sore throat, or sudden loss of taste or sense of smell? . Have you been around anyone with known Covid 19. . Have you been around anyone who is awaiting Covid 19 test results in the past 7 to 10 days? . Have you been around anyone who has been exposed to Covid 19, or has mentioned symptoms of Covid 19 within the past 7 to 10 days?  If you have any concerns/questions about symptoms patients report during screening (either on the phone or at threshold). Contact the provider seeing the patient or DOD for further guidance.  If neither are available contact a member of the leadership team.           Pt answered NO to all pre-screening questions.

## 2018-08-25 ENCOUNTER — Other Ambulatory Visit: Payer: Medicare Other

## 2018-08-25 ENCOUNTER — Other Ambulatory Visit: Payer: Self-pay

## 2018-08-25 DIAGNOSIS — I4891 Unspecified atrial fibrillation: Secondary | ICD-10-CM

## 2018-08-25 LAB — CBC
Hematocrit: 46.3 % (ref 37.5–51.0)
Hemoglobin: 16 g/dL (ref 13.0–17.7)
MCH: 33.1 pg — ABNORMAL HIGH (ref 26.6–33.0)
MCHC: 34.6 g/dL (ref 31.5–35.7)
MCV: 96 fL (ref 79–97)
Platelets: 193 10*3/uL (ref 150–450)
RBC: 4.83 x10E6/uL (ref 4.14–5.80)
RDW: 10.9 % — ABNORMAL LOW (ref 11.6–15.4)
WBC: 8.5 10*3/uL (ref 3.4–10.8)

## 2018-08-25 LAB — BASIC METABOLIC PANEL
BUN/Creatinine Ratio: 14 (ref 10–24)
BUN: 16 mg/dL (ref 8–27)
CO2: 25 mmol/L (ref 20–29)
Calcium: 10.2 mg/dL (ref 8.6–10.2)
Chloride: 101 mmol/L (ref 96–106)
Creatinine, Ser: 1.16 mg/dL (ref 0.76–1.27)
GFR calc Af Amer: 71 mL/min/{1.73_m2} (ref >59)
GFR calc non Af Amer: 62 mL/min/{1.73_m2} (ref >59)
Glucose: 118 mg/dL — ABNORMAL HIGH (ref 65–99)
Potassium: 4.3 mmol/L (ref 3.5–5.2)
Sodium: 138 mmol/L (ref 134–144)

## 2018-09-17 ENCOUNTER — Other Ambulatory Visit: Payer: Self-pay | Admitting: Cardiology
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
## 2018-09-25 ENCOUNTER — Telehealth: Payer: Self-pay | Admitting: Cardiology

## 2018-09-25 NOTE — Telephone Encounter (Signed)
Agree Elaiza Shoberg  

## 2018-09-25 NOTE — Telephone Encounter (Signed)
Spoke with pt's wife and instructed for pt to keep B/P log over the next few days and call next week with readings to determine if additional medicine is required B/p reading listed below was after Diltiazem had been taken Will forward to Dr Stanford Breed for review .Adonis Housekeeper

## 2018-09-25 NOTE — Telephone Encounter (Signed)
New message:     Patient calling concering some medication and BP is high 180/80. Please call patient.

## 2018-09-28 ENCOUNTER — Other Ambulatory Visit: Payer: Self-pay

## 2018-09-28 ENCOUNTER — Ambulatory Visit (INDEPENDENT_AMBULATORY_CARE_PROVIDER_SITE_OTHER): Payer: Medicare Other | Admitting: Internal Medicine

## 2018-09-28 ENCOUNTER — Encounter: Payer: Self-pay | Admitting: Internal Medicine

## 2018-09-28 VITALS — BP 166/98 | HR 54 | Temp 98.3°F | Resp 16 | Ht 65.0 in | Wt 168.8 lb

## 2018-09-28 DIAGNOSIS — I482 Chronic atrial fibrillation, unspecified: Secondary | ICD-10-CM

## 2018-09-28 DIAGNOSIS — I10 Essential (primary) hypertension: Secondary | ICD-10-CM | POA: Diagnosis not present

## 2018-09-28 DIAGNOSIS — I2510 Atherosclerotic heart disease of native coronary artery without angina pectoris: Secondary | ICD-10-CM | POA: Diagnosis not present

## 2018-09-28 MED ORDER — INDAPAMIDE 2.5 MG PO TABS: 2.5000 mg | ORAL_TABLET | Freq: Every day | ORAL | 0 refills | Status: DC
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
## 2018-09-28 NOTE — Patient Instructions (Signed)

## 2018-11-09 ENCOUNTER — Encounter: Payer: Self-pay | Admitting: Internal Medicine

## 2018-11-09 ENCOUNTER — Other Ambulatory Visit (INDEPENDENT_AMBULATORY_CARE_PROVIDER_SITE_OTHER): Payer: Medicare Other

## 2018-11-09 ENCOUNTER — Other Ambulatory Visit: Payer: Self-pay

## 2018-11-09 ENCOUNTER — Ambulatory Visit (INDEPENDENT_AMBULATORY_CARE_PROVIDER_SITE_OTHER): Payer: Medicare Other | Admitting: Internal Medicine

## 2018-11-09 VITALS — BP 130/80 | HR 59 | Temp 98.1°F | Resp 16 | Ht 65.0 in | Wt 169.8 lb

## 2018-11-09 DIAGNOSIS — I10 Essential (primary) hypertension: Secondary | ICD-10-CM

## 2018-11-09 DIAGNOSIS — I482 Chronic atrial fibrillation, unspecified: Secondary | ICD-10-CM | POA: Diagnosis not present

## 2018-11-09 DIAGNOSIS — E781 Pure hyperglyceridemia: Secondary | ICD-10-CM

## 2018-11-09 DIAGNOSIS — I679 Cerebrovascular disease, unspecified: Secondary | ICD-10-CM | POA: Diagnosis not present

## 2018-11-09 DIAGNOSIS — E039 Hypothyroidism, unspecified: Secondary | ICD-10-CM

## 2018-11-09 DIAGNOSIS — I700 Atherosclerosis of aorta: Secondary | ICD-10-CM

## 2018-11-09 DIAGNOSIS — R7303 Prediabetes: Secondary | ICD-10-CM

## 2018-11-09 DIAGNOSIS — I2510 Atherosclerotic heart disease of native coronary artery without angina pectoris: Secondary | ICD-10-CM | POA: Diagnosis not present

## 2018-11-09 LAB — TSH: TSH: 6.69 u[IU]/mL — ABNORMAL HIGH (ref 0.35–4.50)

## 2018-11-09 LAB — TRIGLYCERIDES: Triglycerides: 235 mg/dL — ABNORMAL HIGH (ref 0.0–149.0)

## 2018-11-09 LAB — BASIC METABOLIC PANEL
BUN: 21 mg/dL (ref 6–23)
CO2: 30 mEq/L (ref 19–32)
Calcium: 9.9 mg/dL (ref 8.4–10.5)
Chloride: 101 mEq/L (ref 96–112)
Creatinine, Ser: 1.23 mg/dL (ref 0.40–1.50)
GFR: 69.48 mL/min (ref >60.00)
Glucose, Bld: 122 mg/dL — ABNORMAL HIGH (ref 70–99)
Potassium: 3.6 mEq/L (ref 3.5–5.1)
Sodium: 140 mEq/L (ref 135–145)

## 2018-11-09 LAB — HEMOGLOBIN A1C: Hgb A1c MFr Bld: 5.5 % (ref 4.6–6.5)

## 2018-11-09 MED ORDER — VASCEPA 1 G PO CAPS: 2.0000 | ORAL_CAPSULE | Freq: Two times a day (BID) | ORAL | 1 refills | Status: DC

## 2018-11-09 NOTE — Patient Instructions (Signed)
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
## 2018-11-10 ENCOUNTER — Encounter: Payer: Self-pay | Admitting: Internal Medicine

## 2018-12-07 ENCOUNTER — Other Ambulatory Visit: Payer: Self-pay | Admitting: Internal Medicine

## 2018-12-07 DIAGNOSIS — N402 Nodular prostate without lower urinary tract symptoms: Secondary | ICD-10-CM

## 2018-12-07 MED ORDER — TAMSULOSIN HCL 0.4 MG PO CAPS: 0.4000 mg | ORAL_CAPSULE | Freq: Every day | ORAL | 1 refills | Status: DC

## 2018-12-29 ENCOUNTER — Other Ambulatory Visit: Payer: Self-pay | Admitting: Emergency Medicine

## 2018-12-29 DIAGNOSIS — I10 Essential (primary) hypertension: Secondary | ICD-10-CM

## 2018-12-29 MED ORDER — INDAPAMIDE 2.5 MG PO TABS: 2.5000 mg | ORAL_TABLET | Freq: Every day | ORAL | 0 refills | Status: DC

## 2019-02-11 ENCOUNTER — Other Ambulatory Visit: Payer: Self-pay | Admitting: Internal Medicine

## 2019-02-11 DIAGNOSIS — B356 Tinea cruris: Secondary | ICD-10-CM

## 2019-02-11 MED ORDER — CICLOPIROX 0.77 % EX GEL: 1.0000 | Freq: Two times a day (BID) | CUTANEOUS | 3 refills | Status: DC

## 2019-03-22 ENCOUNTER — Other Ambulatory Visit: Payer: Self-pay | Admitting: Internal Medicine

## 2019-03-22 DIAGNOSIS — L2084 Intrinsic (allergic) eczema: Secondary | ICD-10-CM

## 2019-03-22 MED ORDER — TRIAMCINOLONE ACETONIDE 0.5 % EX CREA: 1.0000 "application " | TOPICAL_CREAM | Freq: Two times a day (BID) | CUTANEOUS | 2 refills | Status: DC

## 2019-03-27 ENCOUNTER — Other Ambulatory Visit: Payer: Self-pay | Admitting: Internal Medicine

## 2019-03-27 DIAGNOSIS — I10 Essential (primary) hypertension: Secondary | ICD-10-CM

## 2019-04-05 ENCOUNTER — Telehealth: Payer: Self-pay | Admitting: Cardiology

## 2019-04-05 NOTE — Progress Notes (Signed)
HPI: FU Atrial fibrillation; history of an acute anterior myocardial infarction occurring in the setting of atrial fibrillation, felt secondary to an embolic event. His LV function is preserved. We have been treating with rate control and anticoagulation at his request. His previous cardiac catheterization in August of 2009 revealed an occluded LAD but no other obstructive disease. The LAD occlusion was felt secondary to an embolus and resolved with thrombus aspiration. Abdominal ultrasound in October 2014 showed no aneurysm. Echocardiogram repeated in November 2014 and showed normal LV function, mild left ventricular hypertrophy and moderate left atrial enlargement. Carotid Dopplers in Nov 2015 showed no significant stenosis. Since I last saw him,patient denies dyspnea, exertional chest pain, palpitations, syncope or bleeding.  Current Outpatient Medications  Medication Sig Dispense Refill  . Ciclopirox 0.77 % gel Apply 1 Act topically 2 (two) times daily. 45 g 3  . diltiazem (CARDIZEM CD) 300 MG 24 hr capsule TAKE 1 CAPSULE BY MOUTH EVERY DAY 90 capsule 3  . dorzolamide-timolol (COSOPT) 22.3-6.8 MG/ML ophthalmic solution Place 1 drop into both eyes 3 (three) times daily.   1  . ELIQUIS 5 MG TABS tablet TAKE 1 TABLET(5 MG) BY MOUTH TWICE DAILY 60 tablet 6  . Icosapent Ethyl (VASCEPA) 1 g CAPS Take 2 capsules (2 g total) by mouth 2 (two) times daily. 360 capsule 1  . indapamide (LOZOL) 2.5 MG tablet TAKE 1 TABLET BY MOUTH DAILY 90 tablet 0  . tamsulosin (FLOMAX) 0.4 MG CAPS capsule Take 1 capsule (0.4 mg total) by mouth daily. 90 capsule 1  . triamcinolone cream (KENALOG) 0.5 % Apply 1 application topically 2 (two) times daily. 60 g 2   No current facility-administered medications for this visit.     Past Medical History:  Diagnosis Date  . Anterior myocardial infarction [facility])    Presumed secondary to embolus from atrial fibrillation  . Arthritis   . Atrial fibrillation ([place])   .  Cataract    right eye   . Glaucoma   . Hard of hearing   . Hemorrhoid   . Hx of adenomatous colonic polyps 07/2008  . Hyperlipidemia   . Hypertension     Past Surgical History:  Procedure Laterality Date  . COLONOSCOPY    . POLYPECTOMY    . STAPEDES SURGERY      Social History   Socioeconomic History  . Marital status: Married    Spouse name: Not on file  . Number of children: 6  . Years of education: Not on file  . Highest education level: Not on file  Occupational History  . Occupation: Retired    Fish farm manager: RETIRED  Tobacco Use  . Smoking status: Former Research scientist (life sciences)  . Smokeless tobacco: Never Used  . Tobacco comment: quit x 35 years ago  Substance and Sexual Activity  . Alcohol use: No    Alcohol/week: 0.0 standard drinks  . Drug use: No  . Sexual activity: Never  Other Topics Concern  . Not on file  Social History Narrative   Lives with spouse   Social Determinants of Health   Financial Resource Strain:   . Difficulty of Paying Living Expenses: Not on file  Food Insecurity:   . Worried About Charity fundraiser in the Last Year: Not on file  . Ran Out of Food in the Last Year: Not on file  Transportation Needs:   . Lack of Transportation (Medical): Not on file  . Lack of Transportation (Non-Medical): Not on file  Physical Activity:   . Days of Exercise per Week: Not on file  . Minutes of Exercise per Session: Not on file  Stress:   . Feeling of Stress : Not on file  Social Connections:   . Frequency of Communication with Friends and Family: Not on file  . Frequency of Social Gatherings with Friends and Family: Not on file  . Attends Religious Services: Not on file  . Active Member of Clubs or Organizations: Not on file  . Attends Archivist Meetings: Not on file  . Marital Status: Not on file  Intimate Partner Violence:   . Fear of Current or Ex-Partner: Not on file  . Emotionally Abused: Not on file  . Physically Abused: Not on file  .  Sexually Abused: Not on file    Family History  Problem Relation Age of Onset  . Heart disease Mother   . Cancer Neg Hx   . Stroke Neg Hx   . Hypertension Neg Hx   . Hyperlipidemia Neg Hx   . Diabetes Neg Hx   . Esophageal cancer Neg Hx   . Colon cancer Neg Hx   . Pancreatic cancer Neg Hx   . Stomach cancer Neg Hx     ROS: no fevers or chills, productive cough, hemoptysis, dysphasia, odynophagia, melena, hematochezia, dysuria, hematuria, rash, seizure activity, orthopnea, PND, pedal edema, claudication. Remaining systems are negative.  Physical Exam: Well-developed well-nourished in no acute distress.  Skin is warm and dry.  HEENT is normal.  Neck is supple.  Chest is clear to auscultation with normal expansion.  Cardiovascular exam is irregular Abdominal exam nontender or distended. No masses palpated. Extremities show no edema. neuro grossly intact  Electrocardiogram today-atrial fibrillation at a rate of 78, nonspecific ST changes, personally reviewed.  A/P  1 permanent atrial fibrillation-patient doing well from a symptomatic standpoint. Continue rate control and anticoagulation. Continue Cardizem and apixaban; hemoglobin and renal function monitored at the [place].  2 hypertension-blood pressure borderline; however he states typically controlled at home.  Continue present medications and follow.  3 hyperlipidemia-intolerant to statins. Not interested in other lipid-lowering medications. Continue diet.  Kirk Ruths, MD

## 2019-04-05 NOTE — Telephone Encounter (Signed)
Returned call-advised ok to come to appt with patient.

## 2019-04-05 NOTE — Telephone Encounter (Signed)
New Message  Pt's wife called and said that she will be accompanying him on the appt. Pt cannot hear and needs her assistance  Please call to discuss

## 2019-04-08 ENCOUNTER — Other Ambulatory Visit: Payer: Self-pay

## 2019-04-08 ENCOUNTER — Ambulatory Visit (INDEPENDENT_AMBULATORY_CARE_PROVIDER_SITE_OTHER): Payer: Medicare Other | Admitting: Cardiology

## 2019-04-08 ENCOUNTER — Encounter: Payer: Self-pay | Admitting: Cardiology

## 2019-04-08 VITALS — BP 144/78 | HR 75 | Ht 65.0 in | Wt 171.0 lb

## 2019-04-08 DIAGNOSIS — I4821 Permanent atrial fibrillation: Secondary | ICD-10-CM | POA: Diagnosis not present

## 2019-04-08 DIAGNOSIS — I10 Essential (primary) hypertension: Secondary | ICD-10-CM

## 2019-04-08 DIAGNOSIS — E7800 Pure hypercholesterolemia, unspecified: Secondary | ICD-10-CM

## 2019-04-08 NOTE — Patient Instructions (Signed)
Medication Instructions:  Your physician recommends that you continue on your current medications as directed. Please refer to the Current Medication list given to you today.  *If you need a refill on your cardiac medications before your next appointment, please call your pharmacy*  Lab Work: NONE  Testing/Procedures: NONE  Follow-Up: At [facility], you and your health needs are our priority.  As part of our continuing mission to provide you with exceptional heart care, we have created designated Provider Care Teams.  These Care Teams include your primary Cardiologist (physician) and Advanced Practice Providers (APPs -  Physician Assistants and Nurse Practitioners) who all work together to provide you with the care you need, when you need it.  Your next appointment:   12 month(s)  You will receive a reminder letter in the mail two months in advance. If you don't receive a letter, please call our office to schedule the follow-up appointment.  The format for your next appointment:   In Person  Provider:   Kirk Ruths, MD

## 2019-04-13 ENCOUNTER — Other Ambulatory Visit: Payer: Self-pay | Admitting: *Deleted

## 2019-04-13 MED ORDER — APIXABAN 5 MG PO TABS: 5.0000 mg | ORAL_TABLET | Freq: Two times a day (BID) | ORAL | 3 refills | Status: DC

## 2019-04-13 NOTE — Telephone Encounter (Signed)
Rx has been sent to the pharmacy electronically. ° °

## 2019-06-02 ENCOUNTER — Other Ambulatory Visit: Payer: Self-pay | Admitting: Internal Medicine

## 2019-06-02 DIAGNOSIS — N402 Nodular prostate without lower urinary tract symptoms: Secondary | ICD-10-CM

## 2019-06-22 ENCOUNTER — Other Ambulatory Visit: Payer: Self-pay | Admitting: Internal Medicine

## 2019-06-22 DIAGNOSIS — I10 Essential (primary) hypertension: Secondary | ICD-10-CM

## 2019-06-26 ENCOUNTER — Other Ambulatory Visit: Payer: Self-pay | Admitting: Cardiology

## 2019-06-26 DIAGNOSIS — I10 Essential (primary) hypertension: Secondary | ICD-10-CM

## 2019-07-01 ENCOUNTER — Other Ambulatory Visit: Payer: Self-pay

## 2019-07-01 ENCOUNTER — Encounter: Payer: Self-pay | Admitting: Internal Medicine

## 2019-07-01 ENCOUNTER — Ambulatory Visit (INDEPENDENT_AMBULATORY_CARE_PROVIDER_SITE_OTHER): Payer: Medicare Other | Admitting: Internal Medicine

## 2019-07-01 VITALS — BP 136/86 | HR 76 | Temp 98.2°F | Resp 16 | Ht 65.0 in | Wt 171.1 lb

## 2019-07-01 DIAGNOSIS — I679 Cerebrovascular disease, unspecified: Secondary | ICD-10-CM

## 2019-07-01 DIAGNOSIS — Z0000 Encounter for general adult medical examination without abnormal findings: Secondary | ICD-10-CM

## 2019-07-01 DIAGNOSIS — E781 Pure hyperglyceridemia: Secondary | ICD-10-CM

## 2019-07-01 DIAGNOSIS — N402 Nodular prostate without lower urinary tract symptoms: Secondary | ICD-10-CM

## 2019-07-01 DIAGNOSIS — E785 Hyperlipidemia, unspecified: Secondary | ICD-10-CM

## 2019-07-01 DIAGNOSIS — I10 Essential (primary) hypertension: Secondary | ICD-10-CM

## 2019-07-01 DIAGNOSIS — I700 Atherosclerosis of aorta: Secondary | ICD-10-CM

## 2019-07-01 DIAGNOSIS — I2510 Atherosclerotic heart disease of native coronary artery without angina pectoris: Secondary | ICD-10-CM

## 2019-07-01 DIAGNOSIS — R7303 Prediabetes: Secondary | ICD-10-CM | POA: Diagnosis not present

## 2019-07-01 DIAGNOSIS — B356 Tinea cruris: Secondary | ICD-10-CM

## 2019-07-01 DIAGNOSIS — I482 Chronic atrial fibrillation, unspecified: Secondary | ICD-10-CM

## 2019-07-01 DIAGNOSIS — E039 Hypothyroidism, unspecified: Secondary | ICD-10-CM

## 2019-07-01 DIAGNOSIS — E559 Vitamin D deficiency, unspecified: Secondary | ICD-10-CM | POA: Diagnosis not present

## 2019-07-01 LAB — CBC WITH DIFFERENTIAL/PLATELET
Basophils Absolute: 0.1 K/uL (ref 0.0–0.1)
Basophils Relative: 0.8 % (ref 0.0–3.0)
Eosinophils Absolute: 0.2 K/uL (ref 0.0–0.7)
Eosinophils Relative: 1.8 % (ref 0.0–5.0)
HCT: 48.5 % (ref 39.0–52.0)
Hemoglobin: 16.2 g/dL (ref 13.0–17.0)
Lymphocytes Relative: 26.1 % (ref 12.0–46.0)
Lymphs Abs: 3 K/uL (ref 0.7–4.0)
MCHC: 33.4 g/dL (ref 30.0–36.0)
MCV: 100.2 fl — ABNORMAL HIGH (ref 78.0–100.0)
Monocytes Absolute: 1.4 K/uL — ABNORMAL HIGH (ref 0.1–1.0)
Monocytes Relative: 12.4 % — ABNORMAL HIGH (ref 3.0–12.0)
Neutro Abs: 6.8 K/uL (ref 1.4–7.7)
Neutrophils Relative %: 58.9 % (ref 43.0–77.0)
Platelets: 210 K/uL (ref 150.0–400.0)
RBC: 4.85 Mil/uL (ref 4.22–5.81)
RDW: 13.1 % (ref 11.5–15.5)
WBC: 11.5 K/uL — ABNORMAL HIGH (ref 4.0–10.5)

## 2019-07-01 LAB — LIPID PANEL
Cholesterol: 272 mg/dL — ABNORMAL HIGH (ref 0–200)
HDL: 45.8 mg/dL
NonHDL: 226.4
Total CHOL/HDL Ratio: 6
Triglycerides: 232 mg/dL — ABNORMAL HIGH (ref 0.0–149.0)
VLDL: 46.4 mg/dL — ABNORMAL HIGH (ref 0.0–40.0)

## 2019-07-01 LAB — URINALYSIS, ROUTINE W REFLEX MICROSCOPIC
Bilirubin Urine: NEGATIVE
Hgb urine dipstick: NEGATIVE
Ketones, ur: NEGATIVE
Leukocytes,Ua: NEGATIVE
Nitrite: NEGATIVE
RBC / HPF: NONE SEEN (ref 0–?)
Specific Gravity, Urine: 1.02 (ref 1.000–1.030)
Total Protein, Urine: NEGATIVE
Urine Glucose: NEGATIVE
Urobilinogen, UA: 0.2 (ref 0.0–1.0)
WBC, UA: NONE SEEN (ref 0–?)
pH: 7.5 (ref 5.0–8.0)

## 2019-07-01 LAB — VITAMIN D 25 HYDROXY (VIT D DEFICIENCY, FRACTURES): VITD: 28.22 ng/mL — ABNORMAL LOW (ref 30.00–100.00)

## 2019-07-01 LAB — TSH: TSH: 4.54 u[IU]/mL — ABNORMAL HIGH (ref 0.35–4.50)

## 2019-07-01 LAB — BASIC METABOLIC PANEL
BUN: 18 mg/dL (ref 6–23)
CO2: 36 mEq/L — ABNORMAL HIGH (ref 19–32)
Calcium: 10.5 mg/dL (ref 8.4–10.5)
Chloride: 98 mEq/L (ref 96–112)
Creatinine, Ser: 1.21 mg/dL (ref 0.40–1.50)
GFR: 70.69 mL/min (ref >60.00)
Glucose, Bld: 117 mg/dL — ABNORMAL HIGH (ref 70–99)
Potassium: 3.6 mEq/L (ref 3.5–5.1)
Sodium: 140 mEq/L (ref 135–145)

## 2019-07-01 LAB — HEPATIC FUNCTION PANEL
ALT: 25 U/L (ref 0–53)
AST: 18 U/L (ref 0–37)
Albumin: 4.4 g/dL (ref 3.5–5.2)
Alkaline Phosphatase: 66 U/L (ref 39–117)
Bilirubin, Direct: 0.1 mg/dL (ref 0.0–0.3)
Total Bilirubin: 0.5 mg/dL (ref 0.2–1.2)
Total Protein: 6.9 g/dL (ref 6.0–8.3)

## 2019-07-01 LAB — LDL CHOLESTEROL, DIRECT: Direct LDL: 187 mg/dL

## 2019-07-01 LAB — HEMOGLOBIN A1C: Hgb A1c MFr Bld: 6.4 % (ref 4.6–6.5)

## 2019-07-01 LAB — PSA: PSA: 2.01 ng/mL (ref 0.10–4.00)

## 2019-07-01 NOTE — Patient Instructions (Signed)
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
## 2019-07-02 ENCOUNTER — Encounter: Payer: Self-pay | Admitting: Internal Medicine

## 2019-07-02 ENCOUNTER — Other Ambulatory Visit: Payer: Self-pay | Admitting: Internal Medicine

## 2019-07-02 DIAGNOSIS — I10 Essential (primary) hypertension: Secondary | ICD-10-CM

## 2019-07-02 MED ORDER — CHOLECALCIFEROL 50 MCG (2000 UT) PO TABS: 1.0000 | ORAL_TABLET | Freq: Every day | ORAL | 1 refills | Status: DC

## 2019-07-02 MED ORDER — NEXLIZET 180-10 MG PO TABS: 1.0000 | ORAL_TABLET | Freq: Every day | ORAL | 1 refills | Status: DC

## 2019-07-02 MED ORDER — ICOSAPENT ETHYL 1 G PO CAPS: 2.0000 g | ORAL_CAPSULE | Freq: Two times a day (BID) | ORAL | 1 refills | Status: DC

## 2019-07-02 MED ORDER — CICLOPIROX 0.77 % EX GEL: 1.0000 | Freq: Two times a day (BID) | CUTANEOUS | 3 refills | Status: DC

## 2019-07-02 MED ORDER — INDAPAMIDE 2.5 MG PO TABS: 2.5000 mg | ORAL_TABLET | Freq: Every day | ORAL | 1 refills | Status: DC

## 2019-07-07 LAB — HM DIABETES EYE EXAM

## 2019-11-30 ENCOUNTER — Other Ambulatory Visit: Payer: Self-pay | Admitting: Internal Medicine

## 2019-11-30 DIAGNOSIS — N402 Nodular prostate without lower urinary tract symptoms: Secondary | ICD-10-CM

## 2019-12-25 ENCOUNTER — Other Ambulatory Visit: Payer: Self-pay | Admitting: Internal Medicine

## 2019-12-25 DIAGNOSIS — I10 Essential (primary) hypertension: Secondary | ICD-10-CM

## 2020-01-03 ENCOUNTER — Encounter: Payer: Self-pay | Admitting: Internal Medicine

## 2020-01-03 ENCOUNTER — Ambulatory Visit (INDEPENDENT_AMBULATORY_CARE_PROVIDER_SITE_OTHER): Payer: Medicare Other | Admitting: Internal Medicine

## 2020-01-03 ENCOUNTER — Other Ambulatory Visit: Payer: Self-pay

## 2020-01-03 VITALS — BP 132/78 | HR 79 | Temp 98.5°F | Resp 16 | Ht 65.0 in | Wt 175.0 lb

## 2020-01-03 DIAGNOSIS — T502X5A Adverse effect of carbonic-anhydrase inhibitors, benzothiadiazides and other diuretics, initial encounter: Secondary | ICD-10-CM | POA: Diagnosis not present

## 2020-01-03 DIAGNOSIS — I700 Atherosclerosis of aorta: Secondary | ICD-10-CM | POA: Diagnosis not present

## 2020-01-03 DIAGNOSIS — E876 Hypokalemia: Secondary | ICD-10-CM

## 2020-01-03 DIAGNOSIS — I2510 Atherosclerotic heart disease of native coronary artery without angina pectoris: Secondary | ICD-10-CM

## 2020-01-03 DIAGNOSIS — I10 Essential (primary) hypertension: Secondary | ICD-10-CM

## 2020-01-03 DIAGNOSIS — R7303 Prediabetes: Secondary | ICD-10-CM

## 2020-01-03 DIAGNOSIS — E118 Type 2 diabetes mellitus with unspecified complications: Secondary | ICD-10-CM | POA: Insufficient documentation

## 2020-01-03 DIAGNOSIS — E785 Hyperlipidemia, unspecified: Secondary | ICD-10-CM | POA: Diagnosis not present

## 2020-01-03 DIAGNOSIS — E039 Hypothyroidism, unspecified: Secondary | ICD-10-CM

## 2020-01-03 DIAGNOSIS — N1831 Chronic kidney disease, stage 3a: Secondary | ICD-10-CM | POA: Diagnosis not present

## 2020-01-03 DIAGNOSIS — Z23 Encounter for immunization: Secondary | ICD-10-CM | POA: Diagnosis not present

## 2020-01-03 LAB — BASIC METABOLIC PANEL
BUN: 13 mg/dL (ref 6–23)
CO2: 32 mEq/L (ref 19–32)
Calcium: 10 mg/dL (ref 8.4–10.5)
Chloride: 96 mEq/L (ref 96–112)
Creatinine, Ser: 1.3 mg/dL (ref 0.40–1.50)
GFR: 53.64 mL/min — ABNORMAL LOW (ref >60.00)
Glucose, Bld: 141 mg/dL — ABNORMAL HIGH (ref 70–99)
Potassium: 3 mEq/L — ABNORMAL LOW (ref 3.5–5.1)
Sodium: 137 mEq/L (ref 135–145)

## 2020-01-03 LAB — URINALYSIS, ROUTINE W REFLEX MICROSCOPIC
Bilirubin Urine: NEGATIVE
Hgb urine dipstick: NEGATIVE
Ketones, ur: NEGATIVE
Leukocytes,Ua: NEGATIVE
Nitrite: NEGATIVE
RBC / HPF: NONE SEEN (ref 0–?)
Specific Gravity, Urine: 1.015 (ref 1.000–1.030)
Total Protein, Urine: NEGATIVE
Urine Glucose: NEGATIVE
Urobilinogen, UA: 0.2 (ref 0.0–1.0)
pH: 8 (ref 5.0–8.0)

## 2020-01-03 LAB — TSH: TSH: 6.27 u[IU]/mL — ABNORMAL HIGH (ref 0.35–4.50)

## 2020-01-03 LAB — HEMOGLOBIN A1C: Hgb A1c MFr Bld: 7 % — ABNORMAL HIGH (ref 4.6–6.5)

## 2020-01-03 LAB — LIPID PANEL
Cholesterol: 242 mg/dL — ABNORMAL HIGH (ref 0–200)
HDL: 40.8 mg/dL (ref >39.00)
NonHDL: 201.68
Total CHOL/HDL Ratio: 6
Triglycerides: 230 mg/dL — ABNORMAL HIGH (ref 0.0–149.0)
VLDL: 46 mg/dL — ABNORMAL HIGH (ref 0.0–40.0)

## 2020-01-03 LAB — LDL CHOLESTEROL, DIRECT: Direct LDL: 164 mg/dL

## 2020-01-03 MED ORDER — POTASSIUM CHLORIDE CRYS ER 20 MEQ PO TBCR: 20.0000 meq | EXTENDED_RELEASE_TABLET | Freq: Two times a day (BID) | ORAL | 0 refills | Status: DC

## 2020-01-03 NOTE — Patient Instructions (Signed)
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
## 2020-01-06 MED ORDER — DAPAGLIFLOZIN PROPANEDIOL 10 MG PO TABS: 10.0000 mg | ORAL_TABLET | Freq: Every day | ORAL | 1 refills | Status: DC

## 2020-03-22 ENCOUNTER — Other Ambulatory Visit: Payer: Self-pay | Admitting: Cardiology

## 2020-03-22 DIAGNOSIS — I10 Essential (primary) hypertension: Secondary | ICD-10-CM

## 2020-03-31 ENCOUNTER — Other Ambulatory Visit: Payer: Self-pay | Admitting: Internal Medicine

## 2020-03-31 DIAGNOSIS — T502X5A Adverse effect of carbonic-anhydrase inhibitors, benzothiadiazides and other diuretics, initial encounter: Secondary | ICD-10-CM

## 2020-03-31 DIAGNOSIS — I10 Essential (primary) hypertension: Secondary | ICD-10-CM

## 2020-04-05 NOTE — Progress Notes (Signed)
HPI: FU Atrial fibrillation; history of an acute anterior myocardial infarction occurring in the setting of atrial fibrillation, felt secondary to an embolic event. His LV function is preserved. We have been treating with rate control and anticoagulation at his request. His previous cardiac catheterization in August of 2009 revealed an occluded LAD but no other obstructive disease. The LAD occlusion was felt secondary to an embolus and resolved with thrombus aspiration. Abdominal ultrasound in October 2014 showed no aneurysm. Echocardiogram repeated in November 2014 and showed normal LV function, mild left ventricular hypertrophy and moderate left atrial enlargement. Carotid Dopplers in Nov 2015 showed no significant stenosis.Since I last saw him,the patient has dyspnea with more extreme activities but not with routine activities. It is relieved with rest. It is not associated with chest pain. There is no orthopnea, PND or pedal edema. There is no syncope or palpitations. There is no exertional chest pain.  No bleeding.   Current Outpatient Medications  Medication Sig Dispense Refill  . apixaban (ELIQUIS) 5 MG TABS tablet Take 1 tablet (5 mg total) by mouth 2 (two) times daily. 180 tablet 3  . Ciclopirox 0.77 % gel Apply 1 Act topically 2 (two) times daily. 45 g 3  . dapagliflozin propanediol (FARXIGA) 10 MG TABS tablet Take 1 tablet (10 mg total) by mouth daily before breakfast. 90 tablet 1  . diltiazem (CARDIZEM CD) 300 MG 24 hr capsule TAKE 1 CAPSULE BY MOUTH EVERY DAY 90 capsule 2  . indapamide (LOZOL) 2.5 MG tablet TAKE 1 TABLET(2.5 MG) BY MOUTH DAILY 90 tablet 1  . potassium chloride SA (KLOR-CON) 20 MEQ tablet TAKE 1 TABLET(20 MEQ) BY MOUTH TWICE DAILY 180 tablet 0  . tamsulosin (FLOMAX) 0.4 MG CAPS capsule TAKE 1 CAPSULE(0.4 MG) BY MOUTH DAILY 90 capsule 1   No current facility-administered medications for this visit.     Past Medical History:  Diagnosis Date  . Anterior  myocardial infarction [facility])    Presumed secondary to embolus from atrial fibrillation  . Arthritis   . Atrial fibrillation ([place])   . Cataract    right eye   . Glaucoma   . Hard of hearing   . Hemorrhoid   . Hx of adenomatous colonic polyps 07/2008  . Hyperlipidemia   . Hypertension     Past Surgical History:  Procedure Laterality Date  . COLONOSCOPY    . POLYPECTOMY    . STAPEDES SURGERY      Social History   Socioeconomic History  . Marital status: Married    Spouse name: Not on file  . Number of children: 6  . Years of education: Not on file  . Highest education level: Not on file  Occupational History  . Occupation: Retired    Fish farm manager: RETIRED  Tobacco Use  . Smoking status: Former Research scientist (life sciences)  . Smokeless tobacco: Never Used  . Tobacco comment: quit x 35 years ago  Substance and Sexual Activity  . Alcohol use: No    Alcohol/week: 0.0 standard drinks  . Drug use: No  . Sexual activity: Never  Other Topics Concern  . Not on file  Social History Narrative   Lives with spouse   Social Determinants of Health   Financial Resource Strain: Not on file  Food Insecurity: Not on file  Transportation Needs: Not on file  Physical Activity: Not on file  Stress: Not on file  Social Connections: Not on file  Intimate Partner Violence: Not on file  Family History  Problem Relation Age of Onset  . Heart disease Mother   . Cancer Neg Hx   . Stroke Neg Hx   . Hypertension Neg Hx   . Hyperlipidemia Neg Hx   . Diabetes Neg Hx   . Esophageal cancer Neg Hx   . Colon cancer Neg Hx   . Pancreatic cancer Neg Hx   . Stomach cancer Neg Hx     ROS: Some back pain but no fevers or chills, productive cough, hemoptysis, dysphasia, odynophagia, melena, hematochezia, dysuria, hematuria, rash, seizure activity, orthopnea, PND, pedal edema, claudication. Remaining systems are negative.  Physical Exam: Well-developed well-nourished in no acute distress.  Skin is warm and dry.   HEENT is normal.  Neck is supple.  Chest is clear to auscultation with normal expansion.  Cardiovascular exam is irregular Abdominal exam nontender or distended. No masses palpated. Extremities show no edema. neuro grossly intact   A/P  1 permanent atrial fibrillation-patient doing well with no symptoms.  Continue Cardizem for rate control.  Continue apixaban.  Patient's laboratories are monitored at the [facility]  2 hypertension-patient's blood pressure is controlled.  Continue present medical regimen.  3 history of hyperlipidemia-patient is intolerant to statins and not interested in other lipid-lowering therapy.  Kirk Ruths, MD

## 2020-04-18 ENCOUNTER — Other Ambulatory Visit: Payer: Self-pay

## 2020-04-18 ENCOUNTER — Ambulatory Visit (INDEPENDENT_AMBULATORY_CARE_PROVIDER_SITE_OTHER): Payer: Medicare Other | Admitting: Cardiology

## 2020-04-18 ENCOUNTER — Encounter: Payer: Self-pay | Admitting: Cardiology

## 2020-04-18 VITALS — BP 148/76 | HR 52 | Ht 65.0 in | Wt 159.2 lb

## 2020-04-18 DIAGNOSIS — I4821 Permanent atrial fibrillation: Secondary | ICD-10-CM | POA: Diagnosis not present

## 2020-04-18 DIAGNOSIS — I10 Essential (primary) hypertension: Secondary | ICD-10-CM | POA: Diagnosis not present

## 2020-04-18 NOTE — Patient Instructions (Signed)

## 2020-05-12 ENCOUNTER — Other Ambulatory Visit: Payer: Self-pay | Admitting: Internal Medicine

## 2020-05-12 DIAGNOSIS — L2084 Intrinsic (allergic) eczema: Secondary | ICD-10-CM

## 2020-05-16 ENCOUNTER — Encounter: Payer: Self-pay | Admitting: Internal Medicine

## 2020-05-17 LAB — HM DIABETES EYE EXAM

## 2020-05-21 ENCOUNTER — Other Ambulatory Visit: Payer: Self-pay | Admitting: Internal Medicine

## 2020-05-21 DIAGNOSIS — N402 Nodular prostate without lower urinary tract symptoms: Secondary | ICD-10-CM

## 2020-06-07 NOTE — Telephone Encounter (Signed)
Called to get clarification on the bill the pt received.  Pt states he received a bill from [facility] for $240 for DOS 01/03/20.  Pt states he sent in $85 "for a shot" (Tdap received), however, he does not know why he is being charged the extra & is unable to communicate that to me.  Will investigate & f/u with the [place].

## 2020-06-14 ENCOUNTER — Telehealth: Payer: Self-pay | Admitting: Internal Medicine

## 2020-06-14 NOTE — Progress Notes (Signed)
  Chronic Care Management   Outreach Note  06/14/2020 Name: HALDON CARLEY MRN: [id number] DOB: [birth year]  Referred by: Janith Lima, MD Reason for referral : No chief complaint on file.   An unsuccessful telephone outreach was attempted today. The patient was referred to the pharmacist for assistance with care management and care coordination.   Follow Up Plan:   Carley Perdue [employer] Scheduler

## 2020-06-14 NOTE — Progress Notes (Signed)
  Chronic Care Management   Note  06/14/2020 Name: AREL TIPPEN MRN: [id number] DOB: 1946/03/05  RYMAN RATHGEBER is a 75 y.o. year old male who is a primary care patient of Janith Lima, MD. I reached out to [name] by phone today in response to a referral sent by Mr. Lillard Anes Tischer's PCP, Janith Lima, MD.   Mr. Kelly was given information about Chronic Care Management services today including:  1. CCM service includes personalized support from designated clinical staff supervised by his physician, including individualized plan of care and coordination with other care providers 2. 24/7 contact phone numbers for assistance for urgent and routine care needs. 3. Service will only be billed when office clinical staff spend 20 minutes or more in a month to coordinate care. 4. Only one practitioner may furnish and bill the service in a calendar month. 5. The patient may stop CCM services at any time (effective at the end of the month) by phone call to the office staff.   Patient agreed to services and verbal consent obtained.   Follow up plan:   Carley Perdue [employer] Scheduler

## 2020-06-17 ENCOUNTER — Telehealth: Payer: Self-pay | Admitting: Internal Medicine

## 2020-06-17 DIAGNOSIS — I10 Essential (primary) hypertension: Secondary | ICD-10-CM

## 2020-06-28 NOTE — Telephone Encounter (Signed)
1.Medication Requested: indapamide (LOZOL) 2.5 MG tablet    2. Pharmacy (Name, Street, [place]): [facility] [phone] - [place], [address]  3. On Med List: yes   4. Last Visit with PCP: 01-03-20  5. Next visit date with PCP: n/a    Agent: Please be advised that RX refills may take up to 3 business days. We ask that you follow-up with your pharmacy.

## 2020-06-28 NOTE — Telephone Encounter (Signed)
Denied. Pt is overdue for an OV. Please schedule.

## 2020-06-28 NOTE — Telephone Encounter (Signed)
Called pt, LVM to call office and schedule appointment

## 2020-06-29 NOTE — Telephone Encounter (Signed)
LVM instructing pt to call back with update.  Will need AWV scheduled.

## 2020-07-27 ENCOUNTER — Encounter: Payer: Self-pay | Admitting: Internal Medicine

## 2020-07-27 ENCOUNTER — Other Ambulatory Visit: Payer: Self-pay

## 2020-07-27 ENCOUNTER — Ambulatory Visit (INDEPENDENT_AMBULATORY_CARE_PROVIDER_SITE_OTHER): Payer: Medicare Other | Admitting: Internal Medicine

## 2020-07-27 VITALS — BP 128/78 | HR 81 | Temp 98.3°F | Resp 16 | Ht 65.0 in | Wt 159.0 lb

## 2020-07-27 DIAGNOSIS — E039 Hypothyroidism, unspecified: Secondary | ICD-10-CM

## 2020-07-27 DIAGNOSIS — E876 Hypokalemia: Secondary | ICD-10-CM | POA: Diagnosis not present

## 2020-07-27 DIAGNOSIS — T502X5A Adverse effect of carbonic-anhydrase inhibitors, benzothiadiazides and other diuretics, initial encounter: Secondary | ICD-10-CM | POA: Diagnosis not present

## 2020-07-27 DIAGNOSIS — I10 Essential (primary) hypertension: Secondary | ICD-10-CM | POA: Diagnosis not present

## 2020-07-27 DIAGNOSIS — I700 Atherosclerosis of aorta: Secondary | ICD-10-CM

## 2020-07-27 DIAGNOSIS — E118 Type 2 diabetes mellitus with unspecified complications: Secondary | ICD-10-CM

## 2020-07-27 LAB — TSH: TSH: 6.9 u[IU]/mL — ABNORMAL HIGH (ref 0.35–4.50)

## 2020-07-27 LAB — BASIC METABOLIC PANEL
BUN: 14 mg/dL (ref 6–23)
CO2: 29 mEq/L (ref 19–32)
Calcium: 10.5 mg/dL (ref 8.4–10.5)
Chloride: 104 mEq/L (ref 96–112)
Creatinine, Ser: 1.16 mg/dL (ref 0.40–1.50)
GFR: 61.26 mL/min (ref >60.00)
Glucose, Bld: 117 mg/dL — ABNORMAL HIGH (ref 70–99)
Potassium: 3.5 mEq/L (ref 3.5–5.1)
Sodium: 140 mEq/L (ref 135–145)

## 2020-07-27 LAB — URINALYSIS, ROUTINE W REFLEX MICROSCOPIC
Bilirubin Urine: NEGATIVE
Hgb urine dipstick: NEGATIVE
Ketones, ur: NEGATIVE
Leukocytes,Ua: NEGATIVE
Nitrite: NEGATIVE
RBC / HPF: NONE SEEN (ref 0–?)
Specific Gravity, Urine: 1.015 (ref 1.000–1.030)
Total Protein, Urine: NEGATIVE
Urine Glucose: >=1000 — AB
Urobilinogen, UA: 0.2 (ref 0.0–1.0)
pH: 8 (ref 5.0–8.0)

## 2020-07-27 LAB — MICROALBUMIN / CREATININE URINE RATIO
Creatinine,U: 42.3 mg/dL
Microalb Creat Ratio: 14.4 mg/g (ref 0.0–30.0)
Microalb, Ur: 6.1 mg/dL — ABNORMAL HIGH (ref 0.0–1.9)

## 2020-07-27 LAB — HEMOGLOBIN A1C: Hgb A1c MFr Bld: 5.5 % (ref 4.6–6.5)

## 2020-07-27 LAB — MAGNESIUM: Magnesium: 2.4 mg/dL (ref 1.5–2.5)

## 2020-07-27 LAB — T4, FREE: Free T4: 0.82 ng/dL (ref 0.60–1.60)
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
## 2020-07-27 NOTE — Patient Instructions (Addendum)
Type 2 Diabetes Mellitus, Diagnosis, Adult Type 2 diabetes (type 2 diabetes mellitus) is a long-term, or chronic, disease. In type 2 diabetes, one or both of these problems may be present:  The pancreas does not make enough of a hormone called insulin.  Cells in the body do not respond properly to insulin that the body makes (insulin resistance). Normally, insulin allows blood sugar (glucose) to enter cells in the body. The cells use glucose for energy. Insulin resistance or lack of insulin causes excess glucose to build up in the blood instead of going into cells. This causes high blood glucose (hyperglycemia).  What are the causes? The exact cause of type 2 diabetes is not known. What increases the risk? The following factors may make you more likely to develop this condition:  Having a family member with type 2 diabetes.  Being overweight or obese.  Being inactive (sedentary).  Having been diagnosed with insulin resistance.  Having a history of prediabetes, diabetes when you were pregnant (gestational diabetes), or polycystic ovary syndrome (PCOS). What are the signs or symptoms? In the early stage of this condition, you may not have symptoms. Symptoms develop slowly and may include:  Increased thirst or hunger.  Increased urination.  Unexplained weight loss.  Tiredness (fatigue) or weakness.  Vision changes, such as blurry vision.  Dark patches on the skin. How is this diagnosed? This condition is diagnosed based on your symptoms, your medical history, a physical exam, and your blood glucose level. Your blood glucose may be checked with one or more of the following blood tests:  A fasting blood glucose (FBG) test. You will not be allowed to eat (you will fast) for 8 hours or longer before a blood sample is taken.  A random blood glucose test. This test checks blood glucose at any time of day regardless of when you ate.  An A1C (hemoglobin A1C) blood test. This test  provides information about blood glucose levels over the previous 2-3 months.  An oral glucose tolerance test (OGTT). This test measures your blood glucose at two times: ? After fasting. This is your baseline blood glucose level. ? Two hours after drinking a beverage that contains glucose. You may be diagnosed with type 2 diabetes if:  Your fasting blood glucose level is 126 mg/dL (7.0 mmol/L) or higher.  Your random blood glucose level is 200 mg/dL (11.1 mmol/L) or higher.  Your A1C level is 6.5% or higher.  Your oral glucose tolerance test result is higher than 200 mg/dL (11.1 mmol/L). These blood tests may be repeated to confirm your diagnosis.   How is this treated? Your treatment may be managed by a specialist called an endocrinologist. Type 2 diabetes may be treated by following instructions from your health care provider about:  Making dietary and lifestyle changes. These may include: ? Following a personalized nutrition plan that is developed by a registered dietitian. ? Exercising regularly. ? Finding ways to manage stress.  Checking your blood glucose level as often as told.  Taking diabetes medicines or insulin daily. This helps to keep your blood glucose levels in the healthy range.  Taking medicines to help prevent complications from diabetes. Medicines may include: ? Aspirin. ? Medicine to lower cholesterol. ? Medicine to control blood pressure. Your health care provider will set treatment goals for you. Your goals will be based on your age, other medical conditions you have, and how you respond to diabetes treatment. Generally, the goal of treatment is to maintain the   following blood glucose levels:  Before meals: 80-130 mg/dL (4.4-7.2 mmol/L).  After meals: below 180 mg/dL (10 mmol/L).  A1C level: less than 7%. Follow these instructions at home: Questions to ask your health care provider Consider asking the following questions:  Should I meet with a certified  diabetes care and education specialist?  What diabetes medicines do I need, and when should I take them?  What equipment will I need to manage my diabetes at home?  How often do I need to check my blood glucose?  Where can I find a support group for people with diabetes?  What number can I call if I have questions?  When is my next appointment? General instructions  Take over-the-counter and prescription medicines only as told by your health care provider.  Keep all follow-up visits as told by your health care provider. This is important. Where to find more information  American Diabetes Association (ADA): www.diabetes.org  American Association of Diabetes Care and Education Specialists (ADCES): www.diabeteseducator.org  International Diabetes Federation (IDF): MemberVerification.ca Contact a health care provider if:  Your blood glucose is at or above 240 mg/dL (13.3 mmol/L) for 2 days in a row.  You have been sick or have had a fever for 2 days or longer, and you are not getting better.  You have any of the following problems for more than 6 hours: ? You cannot eat or drink. ? You have nausea and vomiting. ? You have diarrhea. Get help right away if:  You have severe hypoglycemia. This means your blood glucose is lower than 54 mg/dL (3.0 mmol/L).  You become confused or you have trouble thinking clearly.  You have difficulty breathing.  You have moderate or large ketone levels in your urine. These symptoms may represent a serious problem that is an emergency. Do not wait to see if the symptoms will go away. Get medical help right away. Call your local emergency services (911 in the [place] Do not drive yourself to the hospital. Summary  Type 2 diabetes (type 2 diabetes mellitus) is a long-term, or chronic, disease. In type 2 diabetes, the pancreas does not make enough of a hormone called insulin, or cells in the body do not respond properly to insulin that the body makes (insulin  resistance).  This condition is treated by making dietary and lifestyle changes and taking diabetes medicines or insulin.  Your health care provider will set treatment goals for you. Your goals will be based on your age, other medical conditions you have, and how you respond to diabetes treatment.  Keep all follow-up visits as told by your health care provider. This is important. This information is not intended to replace advice given to you by your health care provider. Make sure you discuss any questions you have with your health care provider. Document Revised: 09/22/2019 Document Reviewed: 09/22/2019 Elsevier Patient Education  2021 [place].  Type 2 Diabetes Mellitus, Diagnosis, Adult Type 2 diabetes (type 2 diabetes mellitus) is a long-term, or chronic, disease. In type 2 diabetes, one or both of these problems may be present:  The pancreas does not make enough of a hormone called insulin.  Cells in the body do not respond properly to insulin that the body makes (insulin resistance). Normally, insulin allows blood sugar (glucose) to enter cells in the body. The cells use glucose for energy. Insulin resistance or lack of insulin causes excess glucose to build up in the blood instead of going into cells. This causes high blood  glucose (hyperglycemia).  What are the causes? The exact cause of type 2 diabetes is not known. What increases the risk? The following factors may make you more likely to develop this condition:  Having a family member with type 2 diabetes.  Being overweight or obese.  Being inactive (sedentary).  Having been diagnosed with insulin resistance.  Having a history of prediabetes, diabetes when you were pregnant (gestational diabetes), or polycystic ovary syndrome (PCOS). What are the signs or symptoms? In the early stage of this condition, you may not have symptoms. Symptoms develop slowly and may include:  Increased thirst or hunger.  Increased  urination.  Unexplained weight loss.  Tiredness (fatigue) or weakness.  Vision changes, such as blurry vision.  Dark patches on the skin. How is this diagnosed? This condition is diagnosed based on your symptoms, your medical history, a physical exam, and your blood glucose level. Your blood glucose may be checked with one or more of the following blood tests:  A fasting blood glucose (FBG) test. You will not be allowed to eat (you will fast) for 8 hours or longer before a blood sample is taken.  A random blood glucose test. This test checks blood glucose at any time of day regardless of when you ate.  An A1C (hemoglobin A1C) blood test. This test provides information about blood glucose levels over the previous 2-3 months.  An oral glucose tolerance test (OGTT). This test measures your blood glucose at two times: ? After fasting. This is your baseline blood glucose level. ? Two hours after drinking a beverage that contains glucose. You may be diagnosed with type 2 diabetes if:  Your fasting blood glucose level is 126 mg/dL (7.0 mmol/L) or higher.  Your random blood glucose level is 200 mg/dL (11.1 mmol/L) or higher.  Your A1C level is 6.5% or higher.  Your oral glucose tolerance test result is higher than 200 mg/dL (11.1 mmol/L). These blood tests may be repeated to confirm your diagnosis.   How is this treated? Your treatment may be managed by a specialist called an endocrinologist. Type 2 diabetes may be treated by following instructions from your health care provider about:  Making dietary and lifestyle changes. These may include: ? Following a personalized nutrition plan that is developed by a registered dietitian. ? Exercising regularly. ? Finding ways to manage stress.  Checking your blood glucose level as often as told.  Taking diabetes medicines or insulin daily. This helps to keep your blood glucose levels in the healthy range.  Taking medicines to help prevent  complications from diabetes. Medicines may include: ? Aspirin. ? Medicine to lower cholesterol. ? Medicine to control blood pressure. Your health care provider will set treatment goals for you. Your goals will be based on your age, other medical conditions you have, and how you respond to diabetes treatment. Generally, the goal of treatment is to maintain the following blood glucose levels:  Before meals: 80-130 mg/dL (4.4-7.2 mmol/L).  After meals: below 180 mg/dL (10 mmol/L).  A1C level: less than 7%. Follow these instructions at home: Questions to ask your health care provider Consider asking the following questions:  Should I meet with a certified diabetes care and education specialist?  What diabetes medicines do I need, and when should I take them?  What equipment will I need to manage my diabetes at home?  How often do I need to check my blood glucose?  Where can I find a support group for people with diabetes?  What number can I call if I have questions?  When is my next appointment? General instructions  Take over-the-counter and prescription medicines only as told by your health care provider.  Keep all follow-up visits as told by your health care provider. This is important. Where to find more information  American Diabetes Association (ADA): www.diabetes.org  American Association of Diabetes Care and Education Specialists (ADCES): www.diabeteseducator.org  International Diabetes Federation (IDF): MemberVerification.ca Contact a health care provider if:  Your blood glucose is at or above 240 mg/dL (13.3 mmol/L) for 2 days in a row.  You have been sick or have had a fever for 2 days or longer, and you are not getting better.  You have any of the following problems for more than 6 hours: ? You cannot eat or drink. ? You have nausea and vomiting. ? You have diarrhea. Get help right away if:  You have severe hypoglycemia. This means your blood glucose is lower than 54  mg/dL (3.0 mmol/L).  You become confused or you have trouble thinking clearly.  You have difficulty breathing.  You have moderate or large ketone levels in your urine. These symptoms may represent a serious problem that is an emergency. Do not wait to see if the symptoms will go away. Get medical help right away. Call your local emergency services (911 in the [place] Do not drive yourself to the hospital. Summary  Type 2 diabetes (type 2 diabetes mellitus) is a long-term, or chronic, disease. In type 2 diabetes, the pancreas does not make enough of a hormone called insulin, or cells in the body do not respond properly to insulin that the body makes (insulin resistance).  This condition is treated by making dietary and lifestyle changes and taking diabetes medicines or insulin.  Your health care provider will set treatment goals for you. Your goals will be based on your age, other medical conditions you have, and how you respond to diabetes treatment.  Keep all follow-up visits as told by your health care provider. This is important. This information is not intended to replace advice given to you by your health care provider. Make sure you discuss any questions you have with your health care provider. Document Revised: 09/22/2019 Document Reviewed: 09/22/2019 Elsevier Patient Education  [place].

## 2020-07-28 LAB — T3: T3, Total: 90 ng/dL (ref 76–181)

## 2020-07-31 ENCOUNTER — Telehealth: Payer: Self-pay | Admitting: Pharmacist

## 2020-07-31 NOTE — Progress Notes (Signed)
Chronic Care Management Pharmacy Assistant   Name: BELLAMY JUDSON  MRN: [id number] DOB: 1945-11-26   Reason for Encounter: Initial Questions Appt: OV 08/03/20 @ 9am   Recent office visits:  07/27/20 Ronnald Ramp (PCP) - Diabetes.D/c Dapagliflozin propanediol. F/u 6 mo.  Recent consult visits:   07/19/20 Jacky Kindle (Optometry) - Glaucoma, right eye. D/c Latanoprost.  06/26/20 Doyle Askew [facility]) - Annual. Shingles vaccine.  05/02/20 Ronalee Red (Dermatology) - F/u Pruritus (Improving). F/u prn.  04/18/20 Crenshaw (Cardiology) - Hypertension, A-Fib. No med changes. F/u 1 yr.  02/24/20 Ronalee Red (Dermatology) - Pruritus. Started phototherapy. F/u 2 months.  Hospital visits:  None in previous 6 months  Medications: Outpatient Encounter Medications as of 07/31/2020  Medication Sig  . apixaban (ELIQUIS) 5 MG TABS tablet Take 1 tablet (5 mg total) by mouth 2 (two) times daily.  . brimonidine (ALPHAGAN) 0.2 % ophthalmic solution Place 1 drop into both eyes 3 (three) times daily.  . Capsaicin 0.1 % CREA Apply topically. Apply small amount to affected area at bedtime to both feet  . Carboxymethylcellulose Sod PF 0.5 % SOLN Apply to eye. Instill 1 drop in each eye 2-4 times a day.  . Ciclopirox 0.77 % gel Apply 1 Act topically 2 (two) times daily.  . clobetasol ointment (TEMOVATE) 7.02 % Apply 1 application topically 2 (two) times daily. Apply small amount to affected area twice a day as needed to the spots on the abdomen  . diltiazem (CARDIZEM CD) 300 MG 24 hr capsule TAKE 1 CAPSULE BY MOUTH EVERY DAY  . indapamide (LOZOL) 2.5 MG tablet TAKE 1 TABLET(2.5 MG) BY MOUTH DAILY  . ketoconazole (NIZORAL) 2 % shampoo Apply 1 application topically.  . Latanoprost 0.005 % EMUL Apply to eye. Instill 1 drop in both eyes at bedtime  . Netarsudil Dimesylate 0.02 % SOLN Apply to eye. Instill 1 drop in both eyes every evening  . potassium chloride SA (KLOR-CON) 20 MEQ tablet TAKE 1 TABLET(20 MEQ) BY MOUTH TWICE DAILY  . selenium  sulfide (SELSUN) 2.5 % shampoo Apply 1 application topically daily as needed for irritation. apply small amount of affected area every other day.  . tamsulosin (FLOMAX) 0.4 MG CAPS capsule TAKE 1 CAPSULE(0.4 MG) BY MOUTH DAILY  . timolol (TIMOPTIC-XR) 0.5 % ophthalmic gel-forming Place 1 drop into both eyes daily.  Marland Kitchen triamcinolone cream (KENALOG) 0.5 % APPLY EXTERNALLY TO THE AFFECTED AREA TWICE DAILY  . [DISCONTINUED] lovastatin (MEVACOR) 20 MG tablet Take 1 tablet (20 mg total) by mouth daily. At bedtime   No facility-administered encounter medications on file as of 07/31/2020.   Have you seen any other providers since your last visit?  Any changes in your medications or health?  Any side effects from any medications?  Do you have an symptoms or problems not managed by your medications?  Any concerns about your health right now? Has your provider asked that you check blood pressure, blood sugar, or follow special diet at home?  Do you get any type of exercise on a regular basis?  Can you think of a goal you would like to reach for your health?  Do you have any problems getting your medications?  Is there anything that you would like to discuss during the appointment?   Please bring medications and supplements to appointment  Patient wife called and stated that patient had canceled this appointment due to being seen at the [facility] hospital and they were managing his medications.  Star Rating Drugs: N/A  Magda Paganini  [place], [place] Pharmacists Assistant [phone]  Time Spent: 32

## 2020-08-03 ENCOUNTER — Ambulatory Visit: Payer: Medicare Other

## 2020-08-18 ENCOUNTER — Other Ambulatory Visit: Payer: Self-pay | Admitting: Internal Medicine

## 2020-08-18 DIAGNOSIS — I10 Essential (primary) hypertension: Secondary | ICD-10-CM

## 2020-08-18 DIAGNOSIS — T502X5A Adverse effect of carbonic-anhydrase inhibitors, benzothiadiazides and other diuretics, initial encounter: Secondary | ICD-10-CM

## 2020-11-18 ENCOUNTER — Other Ambulatory Visit: Payer: Self-pay | Admitting: Internal Medicine

## 2020-11-18 DIAGNOSIS — N402 Nodular prostate without lower urinary tract symptoms: Secondary | ICD-10-CM

## 2020-11-23 ENCOUNTER — Ambulatory Visit: Payer: Medicare Other | Admitting: Internal Medicine

## 2020-11-23 ENCOUNTER — Ambulatory Visit (INDEPENDENT_AMBULATORY_CARE_PROVIDER_SITE_OTHER): Payer: Medicare Other | Admitting: Internal Medicine

## 2020-11-23 ENCOUNTER — Encounter: Payer: Self-pay | Admitting: Internal Medicine

## 2020-11-23 ENCOUNTER — Other Ambulatory Visit: Payer: Self-pay

## 2020-11-23 ENCOUNTER — Ambulatory Visit (INDEPENDENT_AMBULATORY_CARE_PROVIDER_SITE_OTHER): Payer: Medicare Other

## 2020-11-23 VITALS — BP 120/70 | HR 70 | Temp 97.6°F | Ht 65.0 in | Wt 150.0 lb

## 2020-11-23 VITALS — BP 120/70 | HR 70 | Temp 97.6°F | Resp 16 | Ht 65.0 in | Wt 150.0 lb

## 2020-11-23 DIAGNOSIS — I482 Chronic atrial fibrillation, unspecified: Secondary | ICD-10-CM | POA: Diagnosis not present

## 2020-11-23 DIAGNOSIS — Z0000 Encounter for general adult medical examination without abnormal findings: Secondary | ICD-10-CM | POA: Diagnosis not present

## 2020-11-23 DIAGNOSIS — I10 Essential (primary) hypertension: Secondary | ICD-10-CM

## 2020-11-23 DIAGNOSIS — N1831 Chronic kidney disease, stage 3a: Secondary | ICD-10-CM

## 2020-11-23 DIAGNOSIS — E785 Hyperlipidemia, unspecified: Secondary | ICD-10-CM

## 2020-11-23 DIAGNOSIS — E039 Hypothyroidism, unspecified: Secondary | ICD-10-CM

## 2020-11-23 LAB — CBC WITH DIFFERENTIAL/PLATELET
Basophils Absolute: 0.1 10*3/uL (ref 0.0–0.1)
Basophils Relative: 0.5 % (ref 0.0–3.0)
Eosinophils Absolute: 0.2 10*3/uL (ref 0.0–0.7)
Eosinophils Relative: 2 % (ref 0.0–5.0)
HCT: 46.2 % (ref 39.0–52.0)
Hemoglobin: 15.4 g/dL (ref 13.0–17.0)
Lymphocytes Relative: 24.8 % (ref 12.0–46.0)
Lymphs Abs: 2.3 10*3/uL (ref 0.7–4.0)
MCHC: 33.4 g/dL (ref 30.0–36.0)
MCV: 97.3 fl (ref 78.0–100.0)
Monocytes Absolute: 1 10*3/uL (ref 0.1–1.0)
Monocytes Relative: 10.8 % (ref 3.0–12.0)
Neutro Abs: 5.8 10*3/uL (ref 1.4–7.7)
Neutrophils Relative %: 61.9 % (ref 43.0–77.0)
Platelets: 178 10*3/uL (ref 150.0–400.0)
RBC: 4.75 Mil/uL (ref 4.22–5.81)
RDW: 12.6 % (ref 11.5–15.5)
WBC: 9.3 10*3/uL (ref 4.0–10.5)

## 2020-11-23 LAB — BASIC METABOLIC PANEL
BUN: 16 mg/dL (ref 6–23)
CO2: 27 mEq/L (ref 19–32)
Calcium: 10 mg/dL (ref 8.4–10.5)
Chloride: 104 mEq/L (ref 96–112)
Creatinine, Ser: 1.13 mg/dL (ref 0.40–1.50)
GFR: 63.07 mL/min (ref >60.00)
Glucose, Bld: 79 mg/dL (ref 70–99)
Potassium: 3.7 mEq/L (ref 3.5–5.1)
Sodium: 139 mEq/L (ref 135–145)

## 2020-11-23 LAB — URINALYSIS, ROUTINE W REFLEX MICROSCOPIC
Bilirubin Urine: NEGATIVE
Hgb urine dipstick: NEGATIVE
Ketones, ur: NEGATIVE
Leukocytes,Ua: NEGATIVE
Nitrite: NEGATIVE
RBC / HPF: NONE SEEN (ref 0–?)
Specific Gravity, Urine: 1.01 (ref 1.000–1.030)
Total Protein, Urine: NEGATIVE
Urine Glucose: >=1000 — AB
Urobilinogen, UA: 0.2 (ref 0.0–1.0)
pH: 7 (ref 5.0–8.0)

## 2020-11-23 LAB — LIPID PANEL
Cholesterol: 196 mg/dL (ref 0–200)
HDL: 48.9 mg/dL (ref >39.00)
LDL Cholesterol: 127 mg/dL — ABNORMAL HIGH (ref 0–99)
NonHDL: 146.84
Total CHOL/HDL Ratio: 4
Triglycerides: 101 mg/dL (ref 0.0–149.0)
VLDL: 20.2 mg/dL (ref 0.0–40.0)

## 2020-11-23 LAB — TSH: TSH: 10.99 u[IU]/mL — ABNORMAL HIGH (ref 0.35–5.50)

## 2020-11-23 NOTE — Patient Instructions (Signed)
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
## 2020-11-23 NOTE — Progress Notes (Signed)
Subjective:   [name] is a 75 y.o. male who presents for Medicare Annual/Subsequent preventive examination.  Review of Systems     Cardiac Risk Factors include: advanced age (>23mn, >>42women);diabetes mellitus;dyslipidemia;hypertension;family history of premature cardiovascular disease;male gender     Objective:    Today's Vitals   11/23/20 1424  BP: 120/70  Pulse: 70  Resp: 16  Temp: 97.6 F (36.4 C)  SpO2: 98%  Weight: 150 lb (68 kg)  Height: '5\' 5"'$  (1.651 m)  PainSc: 0-No pain   Body mass index is 24.96 kg/m.  Advanced Directives 11/23/2020 03/05/2017 09/23/2015 09/15/2014  Does Patient Have a Medical Advance Directive? Yes No Yes No  Type of Advance Directive Living will;Healthcare Power of [place] will -  Does patient want to make changes to medical advance directive? No - Patient declined - No - Patient declined -  Copy of [place]? No - copy requested - Yes -  Would patient like information on creating a medical advance directive? - No - Patient declined - -    Current Medications (verified) Outpatient Encounter Medications as of 11/23/2020  Medication Sig   apixaban (ELIQUIS) 5 MG TABS tablet Take 1 tablet (5 mg total) by mouth 2 (two) times daily.   brimonidine (ALPHAGAN) 0.2 % ophthalmic solution Place 1 drop into both eyes 3 (three) times daily.   Capsaicin 0.1 % CREA Apply topically. Apply small amount to affected area at bedtime to both feet   Carboxymethylcellulose Sod PF 0.5 % SOLN Apply to eye. Instill 1 drop in each eye 2-4 times a day.   Ciclopirox 0.77 % gel Apply 1 Act topically 2 (two) times daily.   clobetasol ointment (TEMOVATE) 0AB-123456789% Apply 1 application topically 2 (two) times daily. Apply small amount to affected area twice a day as needed to the spots on the abdomen   diltiazem (CARDIZEM CD) 300 MG 24 hr capsule TAKE 1 CAPSULE BY MOUTH EVERY DAY   indapamide (LOZOL) 2.5 MG  tablet TAKE 1 TABLET(2.5 MG) BY MOUTH DAILY   ketoconazole (NIZORAL) 2 % shampoo Apply 1 application topically.   Latanoprost 0.005 % EMUL Apply to eye. Instill 1 drop in both eyes at bedtime   Netarsudil Dimesylate 0.02 % SOLN Apply to eye. Instill 1 drop in both eyes every evening   potassium chloride SA (KLOR-CON) 20 MEQ tablet TAKE 1 TABLET(20 MEQ) BY MOUTH TWICE DAILY   selenium sulfide (SELSUN) 2.5 % shampoo Apply 1 application topically daily as needed for irritation. apply small amount of affected area every other day.   tamsulosin (FLOMAX) 0.4 MG CAPS capsule TAKE 1 CAPSULE(0.4 MG) BY MOUTH DAILY   timolol (TIMOPTIC-XR) 0.5 % ophthalmic gel-forming Place 1 drop into both eyes daily.   triamcinolone cream (KENALOG) 0.5 % APPLY EXTERNALLY TO THE AFFECTED AREA TWICE DAILY   [DISCONTINUED] lovastatin (MEVACOR) 20 MG tablet Take 1 tablet (20 mg total) by mouth daily. At bedtime   No facility-administered encounter medications on file as of 11/23/2020.    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[facility] [address]    Presumed secondary to embolus from atrial fibrillation   Arthritis    Atrial fibrillation (HCC)    Cataract    right eye    Glaucoma    Hard of hearing    Hemorrhoid  Hx of adenomatous colonic polyps 07/2008   Hyperlipidemia    Hypertension    Past Surgical History:  Procedure Laterality Date   COLONOSCOPY     POLYPECTOMY     STAPEDES SURGERY     Family History  Problem Relation Age of Onset   Heart disease Mother    Cancer Neg Hx    Stroke Neg Hx    Hypertension Neg Hx    Hyperlipidemia Neg Hx    Diabetes Neg Hx    Esophageal cancer Neg Hx    Colon cancer Neg Hx    Pancreatic cancer Neg Hx    Stomach cancer Neg Hx    Social History   Socioeconomic History   Marital status: Married    Spouse name: Not on file    Number of children: 6   Years of education: Not on file   Highest education level: Not on file  Occupational History   Occupation: Retired    Fish farm manager: RETIRED  Tobacco Use   Smoking status: Former   Smokeless tobacco: Never   Tobacco comments:    quit x 35 years ago  Substance and Sexual Activity   Alcohol use: No    Alcohol/week: 0.0 standard drinks   Drug use: No   Sexual activity: Never  Other Topics Concern   Not on file  Social History Narrative   Lives with spouse   Social Determinants of Health   Financial Resource Strain: Low Risk    Difficulty of Paying Living Expenses: Not hard at all  Food Insecurity: No Food Insecurity   Worried About Charity fundraiser in the Last Year: Never true   Ran Out of Food in the Last Year: Never true  Transportation Needs: No Transportation Needs   Lack of Transportation (Medical): No   Lack of Transportation (Non-Medical): No  Physical Activity: Sufficiently Active   Days of Exercise per Week: 3 days   Minutes of Exercise per Session: 60 min  Stress: No Stress Concern Present   Feeling of Stress : Not at all  Social Connections: Socially Integrated   Frequency of Communication with Friends and Family: More than three times a week   Frequency of Social Gatherings with Friends and Family: Once a week   Attends Religious Services: More than 4 times per year   Active Member of [employer] or Organizations: Yes   Attends Music therapist: More than 4 times per year   Marital Status: Married    Tobacco Counseling Counseling given: Not Answered Tobacco comments: quit x 35 years ago   Clinical Intake:  Pre-visit preparation completed: Yes  Pain : No/denies pain Pain Score: 0-No pain     BMI - recorded: 24.96 Nutritional Status: BMI of 19-24  Normal Nutritional Risks: None Diabetes: Yes CBG done?: No Did pt. bring in CBG monitor from home?: No  How often do you need to have someone help you when you read  instructions, pamphlets, or other written materials from your doctor or pharmacy?: 1 - Never What is the last grade level you completed in school?: [place]; [employer]  Diabetic? yes  Interpreter Needed?: No  Information entered by :: Lisette Abu, LPN   Activities of Daily Living In your present state of health, do you have any difficulty performing the following activities: 11/23/2020 07/27/2020  Hearing? [place]? N N  Difficulty concentrating or making decisions? N N  Walking or climbing stairs? N N  Dressing or bathing?  N N  Doing errands, shopping? N N  Preparing Food and eating ? N -  Using the Toilet? N -  In the past six months, have you accidently leaked urine? N -  Do you have problems with loss of bowel control? N -  Managing your Medications? N -  Managing your Finances? N -  Housekeeping or managing your Housekeeping? N -  Some recent data might be hidden    Patient Care Team: Janith Lima, MD as PCP - General (Internal Medicine) Charlton Haws, [facility] as Pharmacist (Pharmacist)  Indicate any recent Medical Services you may have received from other than [facility] providers in the past year (date may be approximate).     Assessment:   This is a routine wellness examination for [place].  Hearing/Vision screen No results found.  Dietary issues and exercise activities discussed: Current Exercise Habits: Structured exercise class;Home exercise routine, Type of exercise: walking;treadmill;stretching;strength training/weights, Time (Minutes): 60, Frequency (Times/Week): 3, Weekly Exercise (Minutes/Week): 180, Intensity: Moderate, Exercise limited by: cardiac condition(s)   Goals Addressed               This Visit's Progress     Patient Stated (pt-stated)        My goal is to continue to be healthy and enjoy life.      Depression Screen PHQ 2/9 Scores 11/23/2020 07/27/2020 07/01/2019 06/01/2018 06/01/2018 05/26/2017 05/26/2017  PHQ - 2  Score 0 0 0 0 0 0 0    Fall Risk Fall Risk  11/23/2020 07/27/2020 07/01/2019 11/09/2018 06/01/2018  Falls in the past year? 0 0 0 0 0  Comment - - - - -  Number falls in past yr: 0 - 0 0 -  Injury with Fall? 0 - 0 0 -  Risk for fall due to : No Fall Risks - No Fall Risks - -  Follow up Falls evaluation completed - Falls evaluation completed Falls evaluation completed -    FALL RISK PREVENTION PERTAINING TO THE HOME:  Any stairs in or around the home? Yes  If so, are there any without handrails? No  Home free of loose throw rugs in walkways, pet beds, electrical cords, etc? Yes  Adequate lighting in your home to reduce risk of falls? Yes   ASSISTIVE DEVICES UTILIZED TO PREVENT FALLS:  Life alert? Yes  Use of a cane, walker or w/c? No  Grab bars in the bathroom? No  Shower chair or bench in shower? No  Elevated toilet seat or a handicapped toilet? Yes   TIMED UP AND GO:  Was the test performed? Yes .  Length of time to ambulate 10 feet: 6 sec.   Gait steady and fast without use of assistive device  Cognitive Function: Normal cognitive status assessed by direct observation by this Nurse Health Advisor. No abnormalities found.          Immunizations Immunization History  Administered Date(s) Administered   Pneumococcal Conjugate-13 09/15/2014   Pneumococcal Polysaccharide-23 12/20/2009, 04/05/2020   Td 03/11/1996, 06/29/2008   Tdap 01/03/2020   Zoster Recombinat (Shingrix) 04/05/2020, 06/16/2020    TDAP status: Up to date  Flu Vaccine status: Declined, Education has been provided regarding the importance of this vaccine but patient still declined. Advised may receive this vaccine at local pharmacy or Health Dept. Aware to provide a copy of the vaccination record if obtained from local pharmacy or Health Dept. Verbalized acceptance and understanding.  Pneumococcal vaccine status: Up to date  Covid-19 vaccine status:  Declined, Education has been provided regarding the  importance of this vaccine but patient still declined. Advised may receive this vaccine at local pharmacy or Health Dept.or vaccine clinic. Aware to provide a copy of the vaccination record if obtained from local pharmacy or Health Dept. Verbalized acceptance and understanding.  Qualifies for Shingles Vaccine? Yes   Zostavax completed No   Shingrix Completed?: Yes  Screening Tests Health Maintenance  Topic Date Due   COVID-19 Vaccine (1) Never done   COLONOSCOPY (Pts 45-41yr Insurance coverage will need to be confirmed)  08/07/2019   INFLUENZA VACCINE  10/09/2020   HEMOGLOBIN A1C  01/27/2021   OPHTHALMOLOGY EXAM  05/17/2021   FOOT EXAM  07/27/2021   URINE MICROALBUMIN  07/27/2021   TETANUS/TDAP  01/02/2030   Hepatitis C Screening  Completed   PNA vac Low Risk Adult  Completed   Zoster Vaccines- Shingrix  Completed   HPV VACCINES  Aged Out    Health Maintenance  Health Maintenance Due  Topic Date Due   COVID-19 Vaccine (1) Never done   COLONOSCOPY (Pts 45-475yrInsurance coverage will need to be confirmed)  08/07/2019   INFLUENZA VACCINE  10/09/2020    Colorectal cancer screening: Type of screening: Colonoscopy. Completed 08/06/2016. Repeat every 10 years  Lung Cancer Screening: (Low Dose CT Chest recommended if Age 76-80ears, 30 pack-year currently smoking OR have quit w/in 15years.) does not qualify.   Lung Cancer Screening Referral: no  Additional Screening:  Hepatitis C Screening: does qualify; Completed yes  Vision Screening: Recommended annual ophthalmology exams for early detection of glaucoma and other disorders of the eye. Is the patient up to date with their annual eye exam?  Yes  Who is the provider or what is the name of the office in which the patient attends annual eye exams? [facility] If pt is not established with a provider, would they like to be referred to a provider to establish care? No .   Dental Screening: Recommended annual  dental exams for proper oral hygiene  Community Resource Referral / Chronic Care Management: CRR required this visit?  No   CCM required this visit?  No      Plan:     I have personally reviewed and noted the following in the patient's chart:   Medical and social history Use of alcohol, tobacco or illicit drugs  Current medications and supplements including opioid prescriptions. Patient is not currently taking opioid prescriptions. Functional ability and status Nutritional status Physical activity Advanced directives List of other physicians Hospitalizations, surgeries, and ER visits in previous 12 months Vitals Screenings to include cognitive, depression, and falls Referrals and appointments  In addition, I have reviewed and discussed with patient certain preventive protocols, quality metrics, and best practice recommendations. A written personalized care plan for preventive services as well as general preventive health recommendations were provided to patient.     ShSheral FlowLPN   [id number] Nurse Notes:  Hearing Screening - Comments:: Patient has decreased hearing (bilateral); wears cochlear implant device. Vision Screening - Comments:: No corrective lenses.  Eye exam done annually by [facility].

## 2020-11-23 NOTE — Patient Instructions (Addendum)
[name] , Thank you for taking time to come for your Medicare Wellness Visit. I appreciate your ongoing commitment to your health goals. Please review the following plan we discussed and let me know if I can assist you in the future.   Screening recommendations/referrals: Colonoscopy: 04/28/2013; due every 10 years Recommended yearly ophthalmology/optometry visit for glaucoma screening and checkup Recommended yearly dental visit for hygiene and checkup  Vaccinations: Influenza vaccine: declined Pneumococcal vaccine: 09/15/2014, 04/05/2020 Tdap vaccine: 01/03/2020; due every 10 years Shingles vaccine: 04/05/2020, 06/16/2020 Covid-19: declined  Advanced directives: Please bring a copy of your health care power of attorney and living will to the office at your convenience.  Conditions/risks identified: Yes; Client understands the importance of follow-up with providers by attending scheduled visits and discussed goals to eat healthier, increase physical activity, exercise the brain, socialize more, get enough sleep and make time for laughter.  Next appointment: Please schedule your next Medicare Wellness Visit with your Nurse Health Advisor in 1 year by calling [phone].  Preventive Care 76 Years and Older, Male Preventive care refers to lifestyle choices and visits with your health care provider that can promote health and wellness. What does preventive care include? A yearly physical exam. This is also called an annual well check. Dental exams once or twice a year. Routine eye exams. Ask your health care provider how often you should have your eyes checked. Personal lifestyle choices, including: Daily care of your teeth and gums. Regular physical activity. Eating a healthy diet. Avoiding tobacco and drug use. Limiting alcohol use. Practicing safe sex. Taking low doses of aspirin every day. Taking vitamin and mineral supplements as recommended by your health care provider. What  happens during an annual well check? The services and screenings done by your health care provider during your annual well check will depend on your age, overall health, lifestyle risk factors, and family history of disease. Counseling  Your health care provider may ask you questions about your: Alcohol use. Tobacco use. Drug use. Emotional well-being. Home and relationship well-being. Sexual activity. Eating habits. History of falls. Memory and ability to understand (cognition). Work and work Statistician. Screening  You may have the following tests or measurements: Height, weight, and BMI. Blood pressure. Lipid and cholesterol levels. These may be checked every 5 years, or more frequently if you are over 76 years old. Skin check. Lung cancer screening. You may have this screening every year starting at age 76 if you have a 30-pack-year history of smoking and currently smoke or have quit within the past 15 years. Fecal occult blood test (FOBT) of the stool. You may have this test every year starting at age 76. Flexible sigmoidoscopy or colonoscopy. You may have a sigmoidoscopy every 5 years or a colonoscopy every 10 years starting at age 76. Prostate cancer screening. Recommendations will vary depending on your family history and other risks. Hepatitis C blood test. Hepatitis B blood test. Sexually transmitted disease (STD) testing. Diabetes screening. This is done by checking your blood sugar (glucose) after you have not eaten for a while (fasting). You may have this done every 1-3 years. Abdominal aortic aneurysm (AAA) screening. You may need this if you are a current or former smoker. Osteoporosis. You may be screened starting at age 76 if you are at high risk. Talk with your health care provider about your test results, treatment options, and if necessary, the need for more tests. Vaccines  Your health care provider may recommend certain vaccines, such as:  Influenza vaccine. This  is recommended every year. Tetanus, diphtheria, and acellular pertussis (Tdap, Td) vaccine. You may need a Td booster every 10 years. Zoster vaccine. You may need this after age 76. Pneumococcal 13-valent conjugate (PCV13) vaccine. One dose is recommended after age 76. Pneumococcal polysaccharide (PPSV23) vaccine. One dose is recommended after age 76. Talk to your health care provider about which screenings and vaccines you need and how often you need them. This information is not intended to replace advice given to you by your health care provider. Make sure you discuss any questions you have with your health care provider. Document Released: 03/24/2015 Document Revised: 11/15/2015 Document Reviewed: 12/27/2014 Elsevier Interactive Patient Education  2017 [place] Prevention in the Home Falls can cause injuries. They can happen to people of all ages. There are many things you can do to make your home safe and to help prevent falls. What can I do on the outside of my home? Regularly fix the edges of walkways and driveways and fix any cracks. Remove anything that might make you trip as you walk through a door, such as a raised step or threshold. Trim any bushes or trees on the path to your home. Use bright outdoor lighting. Clear any walking paths of anything that might make someone trip, such as rocks or tools. Regularly check to see if handrails are loose or broken. Make sure that both sides of any steps have handrails. Any raised decks and porches should have guardrails on the edges. Have any leaves, snow, or ice cleared regularly. Use sand or salt on walking paths during winter. Clean up any spills in your garage right away. This includes oil or grease spills. What can I do in the bathroom? Use night lights. Install grab bars by the toilet and in the tub and shower. Do not use towel bars as grab bars. Use non-skid mats or decals in the tub or shower. If you need to sit down  in the shower, use a plastic, non-slip stool. Keep the floor dry. Clean up any water that spills on the floor as soon as it happens. Remove soap buildup in the tub or shower regularly. Attach bath mats securely with double-sided non-slip rug tape. Do not have throw rugs and other things on the floor that can make you trip. What can I do in the bedroom? Use night lights. Make sure that you have a light by your bed that is easy to reach. Do not use any sheets or blankets that are too big for your bed. They should not hang down onto the floor. Have a firm chair that has side arms. You can use this for support while you get dressed. Do not have throw rugs and other things on the floor that can make you trip. What can I do in the kitchen? Clean up any spills right away. Avoid walking on wet floors. Keep items that you use a lot in easy-to-reach places. If you need to reach something above you, use a strong step stool that has a grab bar. Keep electrical cords out of the way. Do not use floor polish or wax that makes floors slippery. If you must use wax, use non-skid floor wax. Do not have throw rugs and other things on the floor that can make you trip. What can I do with my stairs? Do not leave any items on the stairs. Make sure that there are handrails on both sides of the stairs and use them.  Fix handrails that are broken or loose. Make sure that handrails are as long as the stairways. Check any carpeting to make sure that it is firmly attached to the stairs. Fix any carpet that is loose or worn. Avoid having throw rugs at the top or bottom of the stairs. If you do have throw rugs, attach them to the floor with carpet tape. Make sure that you have a light switch at the top of the stairs and the bottom of the stairs. If you do not have them, ask someone to add them for you. What else can I do to help prevent falls? Wear shoes that: Do not have high heels. Have rubber bottoms. Are comfortable  and fit you well. Are closed at the toe. Do not wear sandals. If you use a stepladder: Make sure that it is fully opened. Do not climb a closed stepladder. Make sure that both sides of the stepladder are locked into place. Ask someone to hold it for you, if possible. Clearly mark and make sure that you can see: Any grab bars or handrails. First and last steps. Where the edge of each step is. Use tools that help you move around (mobility aids) if they are needed. These include: Canes. Walkers. Scooters. Crutches. Turn on the lights when you go into a dark area. Replace any light bulbs as soon as they burn out. Set up your furniture so you have a clear path. Avoid moving your furniture around. If any of your floors are uneven, fix them. If there are any pets around you, be aware of where they are. Review your medicines with your doctor. Some medicines can make you feel dizzy. This can increase your chance of falling. Ask your doctor what other things that you can do to help prevent falls. This information is not intended to replace advice given to you by your health care provider. Make sure you discuss any questions you have with your health care provider. Document Released: 12/22/2008 Document Revised: 08/03/2015 Document Reviewed: 04/01/2014 Elsevier Interactive Patient Education  2017 [employer].

## 2020-11-24 ENCOUNTER — Telehealth: Payer: Self-pay | Admitting: Internal Medicine

## 2020-11-24 MED ORDER — LEVOTHYROXINE SODIUM 25 MCG PO TABS
25.0000 ug | ORAL_TABLET | Freq: Every day | ORAL | 0 refills | Status: DC
Start: 2020-11-24 — End: 2021-06-22

## 2020-11-24 NOTE — Telephone Encounter (Signed)
Patient concerned  about side effects of medication levothyroxine (SYNTHROID) 25 MCG tablet  Patient says he read side effects can cause irregular heart rate & pulse rate  Wants to know if its okay for him to take w/ his current heart/health conditions

## 2020-11-24 NOTE — Telephone Encounter (Signed)
Pt has been informed and expressed understanding.  

## 2020-12-10 ENCOUNTER — Other Ambulatory Visit: Payer: Self-pay | Admitting: Cardiology

## 2020-12-10 DIAGNOSIS — I10 Essential (primary) hypertension: Secondary | ICD-10-CM

## 2021-04-23 NOTE — Progress Notes (Signed)
HPI: FU Atrial fibrillation; history of an acute anterior myocardial infarction occurring in the setting of atrial fibrillation, felt secondary to an embolic event. His LV function is preserved. We have been treating with rate control and anticoagulation at his request. His previous cardiac catheterization in August of 2009 revealed an occluded LAD but no other obstructive disease. The LAD occlusion was felt secondary to an embolus and resolved with thrombus aspiration. Abdominal ultrasound in October 2014 showed no aneurysm. Echocardiogram repeated in November 2014 and showed normal LV function, mild left ventricular hypertrophy and moderate left atrial enlargement. Carotid Dopplers in Nov 2015 showed no significant stenosis. Since I last saw him, there is no dyspnea, chest pain, palpitations or syncope.  Current Outpatient Medications  Medication Sig Dispense Refill   apixaban (ELIQUIS) 5 MG TABS tablet Take 1 tablet (5 mg total) by mouth 2 (two) times daily. 180 tablet 3   brimonidine (ALPHAGAN) 0.2 % ophthalmic solution Place 1 drop into both eyes 3 (three) times daily.     Capsaicin 0.1 % CREA Apply topically. Apply small amount to affected area at bedtime to both feet     Carboxymethylcellulose Sod PF 0.5 % SOLN Apply to eye. Instill 1 drop in each eye 2-4 times a day.     Ciclopirox 0.77 % gel Apply 1 Act topically 2 (two) times daily. 45 g 3   clobetasol ointment (TEMOVATE) 3.47 % Apply 1 application topically 2 (two) times daily. Apply small amount to affected area twice a day as needed to the spots on the abdomen     diltiazem (CARDIZEM CD) 300 MG 24 hr capsule TAKE 1 CAPSULE BY MOUTH EVERY DAY 90 capsule 2   indapamide (LOZOL) 2.5 MG tablet TAKE 1 TABLET(2.5 MG) BY MOUTH DAILY 90 tablet 1   ketoconazole (NIZORAL) 2 % shampoo Apply 1 application topically.     Latanoprost 0.005 % EMUL Apply to eye. Instill 1 drop in both eyes at bedtime     levothyroxine (SYNTHROID) 25 MCG tablet  Take 1 tablet (25 mcg total) by mouth daily. 90 tablet 0   Netarsudil Dimesylate 0.02 % SOLN Apply to eye. Instill 1 drop in both eyes every evening     potassium chloride SA (KLOR-CON) 20 MEQ tablet TAKE 1 TABLET(20 MEQ) BY MOUTH TWICE DAILY 180 tablet 1   selenium sulfide (SELSUN) 2.5 % shampoo Apply 1 application topically daily as needed for irritation. apply small amount of affected area every other day.     tamsulosin (FLOMAX) 0.4 MG CAPS capsule TAKE 1 CAPSULE(0.4 MG) BY MOUTH DAILY 90 capsule 1   timolol (TIMOPTIC-XR) 0.5 % ophthalmic gel-forming Place 1 drop into both eyes daily.     triamcinolone cream (KENALOG) 0.5 % APPLY EXTERNALLY TO THE AFFECTED AREA TWICE DAILY 60 g 2   No current facility-administered medications for this visit.     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[facility])    Presumed secondary to embolus from atrial fibrillation   Arthritis    Atrial fibrillation [facility])    Cataract    right eye    Glaucoma    Hard of hearing    Hemorrhoid    Hx of adenomatous colonic polyps 07/2008   Hyperlipidemia    Hypertension     Past Surgical History:  Procedure Laterality Date   COLONOSCOPY     POLYPECTOMY     STAPEDES SURGERY      Social History  Socioeconomic History   Marital status: Married    Spouse name: Not on file   Number of children: 6   Years of education: Not on file   Highest education level: Not on file  Occupational History   Occupation: Retired    Fish farm manager: RETIRED  Tobacco Use   Smoking status: Former   Smokeless tobacco: Never   Tobacco comments:    quit x 35 years ago  Substance and Sexual Activity   Alcohol use: No    Alcohol/week: 0.0 standard drinks   Drug use: No   Sexual activity: Never  Other Topics Concern   Not on file  Social History Narrative   Lives with spouse   Social Determinants of Health   Financial Resource Strain: Low Risk    Difficulty of Paying Living Expenses: Not hard at  all  Food Insecurity: No Food Insecurity   Worried About Charity fundraiser in the Last Year: Never true   [place] in the Last Year: Never true  Transportation Needs: No Transportation Needs   Lack of Transportation (Medical): No   Lack of Transportation (Non-Medical): No  Physical Activity: Sufficiently Active   Days of Exercise per Week: 3 days   Minutes of Exercise per Session: 60 min  Stress: No Stress Concern Present   Feeling of Stress : Not at all  Social Connections: Socially Integrated   Frequency of Communication with Friends and Family: More than three times a week   Frequency of Social Gatherings with Friends and Family: Once a week   Attends Religious Services: More than 4 times per year   Active Member of [employer] or Organizations: Yes   Attends Music therapist: More than 4 times per year   Marital Status: Married  Human resources officer Violence: Not At Risk   Fear of Current or Ex-Partner: No   Emotionally Abused: No   Physically Abused: No   Sexually Abused: No    Family History  Problem Relation Age of Onset   Heart disease Mother    Cancer Neg Hx    Stroke Neg Hx    Hypertension Neg Hx    Hyperlipidemia Neg Hx    Diabetes Neg Hx    Esophageal cancer Neg Hx    Colon cancer Neg Hx    Pancreatic cancer Neg Hx    Stomach cancer Neg Hx     ROS: no fevers or chills, productive cough, hemoptysis, dysphasia, odynophagia, melena, hematochezia, dysuria, hematuria, rash, seizure activity, orthopnea, PND, pedal edema, claudication. Remaining systems are negative.  Physical Exam: Well-developed well-nourished in no acute distress.  Skin is warm and dry.  HEENT is normal.  Neck is supple.  Chest is clear to auscultation with normal expansion.  Cardiovascular exam is irregular Abdominal exam nontender or distended. No masses palpated. Extremities show no edema. neuro grossly intact  ECG-atrial fibrillation at a rate of 71, normal axis, no  significant ST changes.  Personally reviewed  A/P  1 permanent atrial fibrillation-Continue Cardizem for rate control. Continue apixaban.  Patient's laboratories are monitored at the [facility].   2 hypertension-patient's blood pressure is controlled.  Continue present medications and follow.   3 history of hyperlipidemia-patient is intolerant to statins and not interested in other lipid-lowering therapy.  Kirk Ruths, MD

## 2021-05-03 ENCOUNTER — Encounter: Payer: Self-pay | Admitting: Cardiology

## 2021-05-03 ENCOUNTER — Other Ambulatory Visit: Payer: Self-pay

## 2021-05-03 ENCOUNTER — Ambulatory Visit (INDEPENDENT_AMBULATORY_CARE_PROVIDER_SITE_OTHER): Payer: Medicare Other | Admitting: Cardiology

## 2021-05-03 VITALS — BP 140/84 | HR 71 | Ht 65.0 in | Wt 154.6 lb

## 2021-05-03 DIAGNOSIS — E7800 Pure hypercholesterolemia, unspecified: Secondary | ICD-10-CM | POA: Diagnosis not present

## 2021-05-03 DIAGNOSIS — I4821 Permanent atrial fibrillation: Secondary | ICD-10-CM

## 2021-05-03 DIAGNOSIS — I10 Essential (primary) hypertension: Secondary | ICD-10-CM | POA: Diagnosis not present

## 2021-05-03 NOTE — Patient Instructions (Signed)

## 2021-05-14 ENCOUNTER — Other Ambulatory Visit: Payer: Self-pay | Admitting: Internal Medicine

## 2021-05-14 DIAGNOSIS — N402 Nodular prostate without lower urinary tract symptoms: Secondary | ICD-10-CM

## 2021-05-15 ENCOUNTER — Other Ambulatory Visit: Payer: Self-pay | Admitting: Internal Medicine

## 2021-05-15 ENCOUNTER — Telehealth: Payer: Self-pay

## 2021-05-15 DIAGNOSIS — N402 Nodular prostate without lower urinary tract symptoms: Secondary | ICD-10-CM

## 2021-05-15 NOTE — Telephone Encounter (Signed)
Pt wife calling on behave of pt for a refill: ?tamsulosin (FLOMAX) 0.4 MG CAPS capsule ? ?Pharmacy: ?[facility] [place], [place] AT [address] ? ?LOV 11/23/20 ?ROV 06/21/21 ? ? ?

## 2021-06-21 ENCOUNTER — Encounter: Payer: Self-pay | Admitting: Internal Medicine

## 2021-06-21 ENCOUNTER — Ambulatory Visit (INDEPENDENT_AMBULATORY_CARE_PROVIDER_SITE_OTHER): Payer: Medicare Other | Admitting: Internal Medicine

## 2021-06-21 VITALS — BP 144/82 | HR 74 | Temp 98.2°F | Resp 16 | Ht 65.0 in | Wt 158.0 lb

## 2021-06-21 DIAGNOSIS — I10 Essential (primary) hypertension: Secondary | ICD-10-CM

## 2021-06-21 DIAGNOSIS — N1831 Chronic kidney disease, stage 3a: Secondary | ICD-10-CM

## 2021-06-21 DIAGNOSIS — E039 Hypothyroidism, unspecified: Secondary | ICD-10-CM | POA: Diagnosis not present

## 2021-06-21 LAB — BASIC METABOLIC PANEL
BUN: 17 mg/dL (ref 6–23)
CO2: 27 mEq/L (ref 19–32)
Calcium: 9.5 mg/dL (ref 8.4–10.5)
Chloride: 102 mEq/L (ref 96–112)
Creatinine, Ser: 1.12 mg/dL (ref 0.40–1.50)
GFR: 63.49 mL/min (ref >60.00)
Glucose, Bld: 159 mg/dL — ABNORMAL HIGH (ref 70–99)
Potassium: 3.5 mEq/L (ref 3.5–5.1)
Sodium: 136 mEq/L (ref 135–145)

## 2021-06-21 LAB — TSH: TSH: 6.49 u[IU]/mL — ABNORMAL HIGH (ref 0.35–5.50)

## 2021-06-21 MED ORDER — SCOPOLAMINE 1 MG/3DAYS TD PT72: 1.0000 | MEDICATED_PATCH | TRANSDERMAL | 0 refills | Status: DC
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
## 2021-06-21 NOTE — Patient Instructions (Signed)

## 2021-06-22 MED ORDER — LEVOTHYROXINE SODIUM 25 MCG PO TABS: 25.0000 ug | ORAL_TABLET | Freq: Every day | ORAL | 1 refills | Status: DC

## 2021-08-16 ENCOUNTER — Other Ambulatory Visit: Payer: Self-pay | Admitting: Cardiology

## 2021-08-16 DIAGNOSIS — I10 Essential (primary) hypertension: Secondary | ICD-10-CM

## 2021-11-03 ENCOUNTER — Other Ambulatory Visit: Payer: Self-pay | Admitting: Internal Medicine

## 2021-11-03 DIAGNOSIS — N402 Nodular prostate without lower urinary tract symptoms: Secondary | ICD-10-CM

## 2021-11-23 DIAGNOSIS — H903 Sensorineural hearing loss, bilateral: Secondary | ICD-10-CM | POA: Diagnosis not present

## 2021-11-30 ENCOUNTER — Ambulatory Visit (INDEPENDENT_AMBULATORY_CARE_PROVIDER_SITE_OTHER): Payer: Medicare Other

## 2021-11-30 VITALS — Ht 65.0 in | Wt 155.0 lb

## 2021-11-30 DIAGNOSIS — Z0000 Encounter for general adult medical examination without abnormal findings: Secondary | ICD-10-CM

## 2021-11-30 NOTE — Progress Notes (Signed)
Subjective:   [name] is a 76 y.o. male who presents for Medicare Annual/Subsequent preventive examination.  Virtual Visit via Telephone Note  I connected with  [name] on 11/30/21 at  8:45 AM EDT by telephone and verified that I am speaking with the correct person using two identifiers.  Location: Patient: HOME  Provider: [facility]  Persons participating in the virtual visit: patient/Nurse Health Advisor   I discussed the limitations, risks, security and privacy concerns of performing an evaluation and management service by telephone and the availability of in person appointments. The patient expressed understanding and agreed to proceed.  Interactive audio and video telecommunications were attempted between this nurse and patient, however failed, due to patient having technical difficulties OR patient did not have access to video capability.  We continued and completed visit with audio only.  Some vital signs may be absent or patient reported.   [name], LPN  Review of Systems     Cardiac Risk Factors include: advanced age (>57mn, >>34women);hypertension;male gender;dyslipidemia     Objective:    Today's Vitals   11/30/21 0826  Weight: 155 lb (70.3 kg)  Height: '5\' 5"'$  (1.651 m)   Body mass index is 25.79 kg/m.     11/30/2021    8:31 AM 11/23/2020    2:41 PM 03/05/2017    9:33 AM 09/23/2015    2:13 PM 09/15/2014    9:04 AM  Advanced Directives  Does Patient Have a Medical Advance Directive? Yes Yes No Yes No  Type of [place] Living will;Healthcare Power of [place]   Does patient want to make changes to medical advance directive?  No - Patient declined  No - Patient declined   Copy of [place] Chart? No - copy requested No - copy requested  Yes   Would patient like information on creating a medical advance directive?   No - Patient  declined      Current Medications (verified) Outpatient Encounter Medications as of 11/30/2021  Medication Sig   apixaban (ELIQUIS) 5 MG TABS tablet Take 1 tablet (5 mg total) by mouth 2 (two) times daily.   brimonidine (ALPHAGAN) 0.2 % ophthalmic solution Place 1 drop into both eyes 3 (three) times daily.   Capsaicin 0.1 % CREA Apply topically. Apply small amount to affected area at bedtime to both feet   Carboxymethylcellulose Sod PF 0.5 % SOLN Apply to eye. Instill 1 drop in each eye 2-4 times a day.   Ciclopirox 0.77 % gel Apply 1 Act topically 2 (two) times daily.   clobetasol ointment (TEMOVATE) 02.58% Apply 1 application topically 2 (two) times daily. Apply small amount to affected area twice a day as needed to the spots on the abdomen   diltiazem (CARDIZEM CD) 300 MG 24 hr capsule TAKE 1 CAPSULE BY MOUTH EVERY DAY   ketoconazole (NIZORAL) 2 % shampoo Apply 1 application topically.   Latanoprost 0.005 % EMUL Apply to eye. Instill 1 drop in both eyes at bedtime   levothyroxine (SYNTHROID) 25 MCG tablet Take 1 tablet (25 mcg total) by mouth daily.   Netarsudil Dimesylate 0.02 % SOLN Apply to eye. Instill 1 drop in both eyes every evening   potassium chloride SA (KLOR-CON) 20 MEQ tablet TAKE 1 TABLET(20 MEQ) BY MOUTH TWICE DAILY   scopolamine (TRANSDERM-SCOP) 1 MG/3DAYS Place 1 patch (1.5 mg total) onto the skin every 3 (  three) days.   selenium sulfide (SELSUN) 2.5 % shampoo Apply 1 application topically daily as needed for irritation. apply small amount of affected area every other day.   tamsulosin (FLOMAX) 0.4 MG CAPS capsule TAKE 1 CAPSULE(0.4 MG) BY MOUTH DAILY   timolol (TIMOPTIC-XR) 0.5 % ophthalmic gel-forming Place 1 drop into both eyes daily.   triamcinolone cream (KENALOG) 0.5 % APPLY EXTERNALLY TO THE AFFECTED AREA TWICE DAILY   [DISCONTINUED] lovastatin (MEVACOR) 20 MG tablet Take 1 tablet (20 mg total) by mouth daily. At bedtime   No facility-administered encounter  medications on file as of 11/30/2021.    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[place])    Presumed secondary to embolus from atrial fibrillation   Arthritis    Atrial fibrillation (HCC)    Cataract    right eye    Glaucoma    Hard of hearing    Hemorrhoid    Hx of adenomatous colonic polyps 07/2008   Hyperlipidemia    Hypertension    Past Surgical History:  Procedure Laterality Date   COLONOSCOPY     POLYPECTOMY     STAPEDES SURGERY     Family History  Problem Relation Age of Onset   Heart disease Mother    Cancer Neg Hx    Stroke Neg Hx    Hypertension Neg Hx    Hyperlipidemia Neg Hx    Diabetes Neg Hx    Esophageal cancer Neg Hx    Colon cancer Neg Hx    Pancreatic cancer Neg Hx    Stomach cancer Neg Hx    Social History   Socioeconomic History   Marital status: Married    Spouse name: Not on file   Number of children: 6   Years of education: Not on file   Highest education level: Not on file  Occupational History   Occupation: Retired    Fish farm manager: RETIRED  Tobacco Use   Smoking status: Former   Smokeless tobacco: Never   Tobacco comments:    quit x 35 years ago  Substance and Sexual Activity   Alcohol use: No    Alcohol/week: 0.0 standard drinks of alcohol   Drug use: No   Sexual activity: Never  Other Topics Concern   Not on file  Social History Narrative   Lives with spouse   Social Determinants of Health   Financial Resource Strain: Low Risk  (11/30/2021)   Overall Financial Resource Strain (CARDIA)    Difficulty of Paying Living Expenses: Not hard at all  Food Insecurity: No Food Insecurity (11/30/2021)   Hunger Vital Sign    Worried About Running Out of Food in the Last Year: Never true    Ran Out of Food in the Last Year: Never true  Transportation Needs: No Transportation Needs  (11/30/2021)   PRAPARE - Hydrologist (Medical): No    Lack of Transportation (Non-Medical): No  Physical Activity: Sufficiently Active (11/30/2021)   Exercise Vital Sign    Days of Exercise per Week: 3 days    Minutes of Exercise per Session: 60 min  Stress: No Stress Concern Present (11/30/2021)   [place]    Feeling of Stress : Not at all  Social Connections: Moderately Integrated (11/30/2021)   Social Connection and Isolation Panel [NHANES]    Frequency of  Communication with Friends and Family: More than three times a week    Frequency of Social Gatherings with Friends and Family: More than three times a week    Attends Religious Services: More than 4 times per year    Active Member of [employer] or Organizations: No    Attends Music therapist: Never    Marital Status: Married    Tobacco Counseling Counseling given: Not Answered Tobacco comments: quit x 35 years ago   Clinical Intake:  Pre-visit preparation completed: Yes  Pain : No/denies pain     Nutritional Risks: None Diabetes: No  How often do you need to have someone help you when you read instructions, pamphlets, or other written materials from your doctor or pharmacy?: 1 - Never  Diabetic?NO   Interpreter Needed?: No  Information entered by :: Jadene Pierini, LPN   Activities of Daily Living    11/30/2021    8:31 AM  In your present state of health, do you have any difficulty performing the following activities:  Hearing? 0  Vision? 0  Difficulty concentrating or making decisions? 0  Walking or climbing stairs? 0  Dressing or bathing? 0  Doing errands, shopping? 0  Preparing Food and eating ? N  Using the Toilet? N  In the past six months, have you accidently leaked urine? N  Do you have problems with loss of bowel control? N  Managing your Medications? N  Managing your Finances? N  Housekeeping  or managing your Housekeeping? N    Patient Care Team: Janith Lima, MD as PCP - General (Internal Medicine) Stanford Breed Denice Bors, MD as PCP - Cardiology (Cardiology) Charlton Haws, [facility] as Pharmacist (Pharmacist)  Indicate any recent Medical Services you may have received from other than [facility] providers in the past year (date may be approximate).     Assessment:   This is a routine wellness examination for [place].  Hearing/Vision screen [facility] Scre[facility] - Comments:: [facility]   Dietary issues and exercise activities discussed: Current Exercise Habits: Home exercise routine, Type of exercise: walking, Time (Minutes): 60, Frequency (Times/Week): 3, Weekly Exercise (Minutes/Week): 180, Intensity: Mild, Exercise limited by: None identified   Goals Addressed               This Visit's Progress     Patient Stated (pt-stated)   On track     My goal is to continue to be healthy and enjoy life.       Depression Screen    11/30/2021    8:29 AM 11/23/2020    2:40 PM 07/27/2020    9:50 AM 07/01/2019    2:19 PM 06/01/2018    4:14 PM 06/01/2018    1:07 PM 05/26/2017    5:05 PM  PHQ 2/9 Scores  PHQ - 2 Score 0 0 0 0 0 0 0    Fall Risk    11/30/2021    8:27 AM 11/23/2020    2:43 PM 07/27/2020    9:46 AM 07/01/2019    2:18 PM 11/09/2018    8:23 AM  Fall Risk   Falls in the past year? 0 0 0 0 0  Number falls in past yr: 0 0  0 0  Injury with Fall? 0 0  0 0  Risk for fall due to : No Fall Risks No Fall Risks  No Fall Risks   Follow up Falls prevention discussed Falls evaluation completed  Falls evaluation completed Falls evaluation completed  FALL RISK PREVENTION PERTAINING TO THE HOME:  Any stairs in or around the home? Yes  If so, are there any without handrails? No  Home free of loose throw rugs in walkways, pet beds, electrical cords, etc? Yes  Adequate lighting in your home to reduce risk of falls? Yes   ASSISTIVE DEVICES UTILIZED TO PREVENT FALLS:  Life  alert? No  Use of a cane, walker or w/c? No  Grab bars in the bathroom? No  Shower chair or bench in shower? No  Elevated toilet seat or a handicapped toilet? No          11/30/2021    8:31 AM  6CIT Screen  What Year? 0 points  What month? 0 points  What time? 0 points  Count back from 20 0 points  Months in reverse 0 points  Repeat phrase 0 points  Total Score 0 points    Immunizations Immunization History  Administered Date(s) Administered   Pneumococcal Conjugate-13 09/15/2014   Pneumococcal Polysaccharide-23 12/20/2009, 04/05/2020   Td 03/11/1996, 06/29/2008   Tdap 01/03/2020   Zoster Recombinat (Shingrix) 04/05/2020, 06/16/2020    TDAP status: Up to date  Flu Vaccine status: Up to date  Pneumococcal vaccine status: Up to date  Covid-19 vaccine status: Completed vaccines  Qualifies for Shingles Vaccine? Yes   Zostavax completed Yes   Shingrix Completed?: Yes  Screening Tests Health Maintenance  Topic Date Due   COVID-19 Vaccine (1) Never done   COLONOSCOPY (Pts 45-62yr Insurance coverage will need to be confirmed)  08/07/2019   HEMOGLOBIN A1C  01/27/2021   OPHTHALMOLOGY EXAM  05/17/2021   Diabetic kidney evaluation - Urine ACR  07/27/2021   FOOT EXAM  07/27/2021   INFLUENZA VACCINE  10/09/2021   Diabetic kidney evaluation - GFR measurement  06/22/2022   TETANUS/TDAP  01/02/2030   Pneumonia Vaccine 76 Years old  Completed   Hepatitis C Screening  Completed   Zoster Vaccines- Shingrix  Completed   HPV VACCINES  Aged Out    Health Maintenance  Health Maintenance Due  Topic Date Due   COVID-19 Vaccine (1) Never done   COLONOSCOPY (Pts 45-470yrInsurance coverage will need to be confirmed)  08/07/2019   HEMOGLOBIN A1C  01/27/2021   OPHTHALMOLOGY EXAM  05/17/2021   Diabetic kidney evaluation - Urine ACR  07/27/2021   FOOT EXAM  07/27/2021   INFLUENZA VACCINE  10/09/2021    Colorectal cancer screening: No longer required.   Lung Cancer  Screening: (Low Dose CT Chest recommended if Age 77-80ears, 30 pack-year currently smoking OR have quit w/in 15years.) does not qualify.   Lung Cancer Screening Referral: n/a  Additional Screening:  Hepatitis C Screening: does not qualify;  Vision Screening: Recommended annual ophthalmology exams for early detection of glaucoma and other disorders of the eye. Is the patient up to date with their annual eye exam?  Yes  Who is the provider or what is the name of the office in which the patient attends [facility] exams? [facility] If pt is not established with a provider, would they like to be referred to a provider to establish care? No .   Dental Screening: Recommended annual dental exams for proper oral hygiene  Community Resource Referral / Chronic Care Management: CRR required this visit?  No   CCM required this visit?  No      Plan:     I have personally reviewed and noted the following in the patient's chart:   Medical and  social history Use of alcohol, tobacco or illicit drugs  Current medications and supplements including opioid prescriptions. Patient is not currently taking opioid prescriptions. Functional ability and status Nutritional status Physical activity Advanced directives List of other physicians Hospitalizations, surgeries, and ER visits in previous 12 months Vitals Screenings to include cognitive, depression, and falls Referrals and appointments  In addition, I have reviewed and discussed with patient certain preventive protocols, quality metrics, and best practice recommendations. A written personalized care plan for preventive services as well as general preventive health recommendations were provided to patient.     [name], LPN   [id number]   Nurse Notes: none

## 2021-11-30 NOTE — Patient Instructions (Signed)
[name] , Thank you for taking time to come for your Medicare Wellness Visit. I appreciate your ongoing commitment to your health goals. Please review the following plan we discussed and let me know if I can assist you in the future.   These are the goals we discussed:  Goals       Patient Stated (pt-stated)      My goal is to continue to be healthy and enjoy life.      Weight < 150 lb (68.04 kg)      Will try to monitor portions; feels this is where his weight; already does 10000 steps a day         This is a list of the screening recommended for you and due dates:  Health Maintenance  Topic Date Due   COVID-19 Vaccine (1) Never done   Colon Cancer Screening  08/07/2019   Hemoglobin A1C  01/27/2021   Eye exam for diabetics  05/17/2021   Yearly kidney health urinalysis for diabetes  07/27/2021   Complete foot exam   07/27/2021   Flu Shot  10/09/2021   Yearly kidney function blood test for diabetes  06/22/2022   Tetanus Vaccine  01/02/2030   Pneumonia Vaccine  Completed   Hepatitis C Screening: USPSTF Recommendation to screen - Ages 52-79 yo.  Completed   Zoster (Shingles) Vaccine  Completed   HPV Vaccine  Aged Out    Advanced directives: Please bring a copy of your health care power of attorney and living will to the office to be added to your chart at your convenience.   Conditions/risks identified: Aim for 30 minutes of exercise or brisk walking, 6-8 glasses of water, and 5 servings of fruits and vegetables each day.   Next appointment: Follow up in one year for your annual wellness visit.   Preventive Care 18 Years and Older, Male  Preventive care refers to lifestyle choices and visits with your health care provider that can promote health and wellness. What does preventive care include? A yearly physical exam. This is also called an annual well check. Dental exams once or twice a year. Routine eye exams. Ask your health care provider how often you should have  your eyes checked. Personal lifestyle choices, including: Daily care of your teeth and gums. Regular physical activity. Eating a healthy diet. Avoiding tobacco and drug use. Limiting alcohol use. Practicing safe sex. Taking low doses of aspirin every day. Taking vitamin and mineral supplements as recommended by your health care provider. What happens during an annual well check? The services and screenings done by your health care provider during your annual well check will depend on your age, overall health, lifestyle risk factors, and family history of disease. Counseling  Your health care provider may ask you questions about your: Alcohol use. Tobacco use. Drug use. Emotional well-being. Home and relationship well-being. Sexual activity. Eating habits. History of falls. Memory and ability to understand (cognition). Work and work Statistician. Screening  You may have the following tests or measurements: Height, weight, and BMI. Blood pressure. Lipid and cholesterol levels. These may be checked every 5 years, or more frequently if you are over 19 years old. Skin check. Lung cancer screening. You may have this screening every year starting at age 27 if you have a 30-pack-year history of smoking and currently smoke or have quit within the past 15 years. Fecal occult blood test (FOBT) of the stool. You may have this test every year starting at age  50. Flexible sigmoidoscopy or colonoscopy. You may have a sigmoidoscopy every 5 years or a colonoscopy every 10 years starting at age 63. Prostate cancer screening. Recommendations will vary depending on your family history and other risks. Hepatitis C blood test. Hepatitis B blood test. Sexually transmitted disease (STD) testing. Diabetes screening. This is done by checking your blood sugar (glucose) after you have not eaten for a while (fasting). You may have this done every 1-3 years. Abdominal aortic aneurysm (AAA) screening. You may  need this if you are a current or former smoker. Osteoporosis. You may be screened starting at age 74 if you are at high risk. Talk with your health care provider about your test results, treatment options, and if necessary, the need for more tests. Vaccines  Your health care provider may recommend certain vaccines, such as: Influenza vaccine. This is recommended every year. Tetanus, diphtheria, and acellular pertussis (Tdap, Td) vaccine. You may need a Td booster every 10 years. Zoster vaccine. You may need this after age 51. Pneumococcal 13-valent conjugate (PCV13) vaccine. One dose is recommended after age 51. Pneumococcal polysaccharide (PPSV23) vaccine. One dose is recommended after age 59. Talk to your health care provider about which screenings and vaccines you need and how often you need them. This information is not intended to replace advice given to you by your health care provider. Make sure you discuss any questions you have with your health care provider. Document Released: 03/24/2015 Document Revised: 11/15/2015 Document Reviewed: 12/27/2014 Elsevier Interactive Patient Education  2017 [place] Prevention in the Home Falls can cause injuries. They can happen to people of all ages. There are many things you can do to make your home safe and to help prevent falls. What can I do on the outside of my home? Regularly fix the edges of walkways and driveways and fix any cracks. Remove anything that might make you trip as you walk through a door, such as a raised step or threshold. Trim any bushes or trees on the path to your home. Use bright outdoor lighting. Clear any walking paths of anything that might make someone trip, such as rocks or tools. Regularly check to see if handrails are loose or broken. Make sure that both sides of any steps have handrails. Any raised decks and porches should have guardrails on the edges. Have any leaves, snow, or ice cleared  regularly. Use sand or salt on walking paths during winter. Clean up any spills in your garage right away. This includes oil or grease spills. What can I do in the bathroom? Use night lights. Install grab bars by the toilet and in the tub and shower. Do not use towel bars as grab bars. Use non-skid mats or decals in the tub or shower. If you need to sit down in the shower, use a plastic, non-slip stool. Keep the floor dry. Clean up any water that spills on the floor as soon as it happens. Remove soap buildup in the tub or shower regularly. Attach bath mats securely with double-sided non-slip rug tape. Do not have throw rugs and other things on the floor that can make you trip. What can I do in the bedroom? Use night lights. Make sure that you have a light by your bed that is easy to reach. Do not use any sheets or blankets that are too big for your bed. They should not hang down onto the floor. Have a firm chair that has side arms. You can use  this for support while you get dressed. Do not have throw rugs and other things on the floor that can make you trip. What can I do in the kitchen? Clean up any spills right away. Avoid walking on wet floors. Keep items that you use a lot in easy-to-reach places. If you need to reach something above you, use a strong step stool that has a grab bar. Keep electrical cords out of the way. Do not use floor polish or wax that makes floors slippery. If you must use wax, use non-skid floor wax. Do not have throw rugs and other things on the floor that can make you trip. What can I do with my stairs? Do not leave any items on the stairs. Make sure that there are handrails on both sides of the stairs and use them. Fix handrails that are broken or loose. Make sure that handrails are as long as the stairways. Check any carpeting to make sure that it is firmly attached to the stairs. Fix any carpet that is loose or worn. Avoid having throw rugs at the top or  bottom of the stairs. If you do have throw rugs, attach them to the floor with carpet tape. Make sure that you have a light switch at the top of the stairs and the bottom of the stairs. If you do not have them, ask someone to add them for you. What else can I do to help prevent falls? Wear shoes that: Do not have high heels. Have rubber bottoms. Are comfortable and fit you well. Are closed at the toe. Do not wear sandals. If you use a stepladder: Make sure that it is fully opened. Do not climb a closed stepladder. Make sure that both sides of the stepladder are locked into place. Ask someone to hold it for you, if possible. Clearly mark and make sure that you can see: Any grab bars or handrails. First and last steps. Where the edge of each step is. Use tools that help you move around (mobility aids) if they are needed. These include: Canes. Walkers. Scooters. Crutches. Turn on the lights when you go into a dark area. Replace any light bulbs as soon as they burn out. Set up your furniture so you have a clear path. Avoid moving your furniture around. If any of your floors are uneven, fix them. If there are any pets around you, be aware of where they are. Review your medicines with your doctor. Some medicines can make you feel dizzy. This can increase your chance of falling. Ask your doctor what other things that you can do to help prevent falls. This information is not intended to replace advice given to you by your health care provider. Make sure you discuss any questions you have with your health care provider. Document Released: 12/22/2008 Document Revised: 08/03/2015 Document Reviewed: 04/01/2014 Elsevier Interactive Patient Education  2017 [employer].

## 2021-12-25 LAB — HM DIABETES EYE EXAM

## 2021-12-26 ENCOUNTER — Ambulatory Visit (INDEPENDENT_AMBULATORY_CARE_PROVIDER_SITE_OTHER): Payer: Medicare Other | Admitting: Internal Medicine

## 2021-12-26 ENCOUNTER — Encounter: Payer: Self-pay | Admitting: Internal Medicine

## 2021-12-26 VITALS — BP 146/76 | HR 74 | Temp 98.2°F | Ht 65.0 in | Wt 158.0 lb

## 2021-12-26 DIAGNOSIS — E118 Type 2 diabetes mellitus with unspecified complications: Secondary | ICD-10-CM | POA: Diagnosis not present

## 2021-12-26 DIAGNOSIS — E785 Hyperlipidemia, unspecified: Secondary | ICD-10-CM | POA: Diagnosis not present

## 2021-12-26 DIAGNOSIS — N1831 Chronic kidney disease, stage 3a: Secondary | ICD-10-CM

## 2021-12-26 DIAGNOSIS — E039 Hypothyroidism, unspecified: Secondary | ICD-10-CM

## 2021-12-26 DIAGNOSIS — I482 Chronic atrial fibrillation, unspecified: Secondary | ICD-10-CM | POA: Diagnosis not present

## 2021-12-26 DIAGNOSIS — I10 Essential (primary) hypertension: Secondary | ICD-10-CM | POA: Diagnosis not present

## 2021-12-26 LAB — BASIC METABOLIC PANEL
BUN: 16 mg/dL (ref 6–23)
CO2: 31 mEq/L (ref 19–32)
Calcium: 9.8 mg/dL (ref 8.4–10.5)
Chloride: 101 mEq/L (ref 96–112)
Creatinine, Ser: 1.19 mg/dL (ref 0.40–1.50)
GFR: 58.82 mL/min — ABNORMAL LOW (ref >60.00)
Glucose, Bld: 108 mg/dL — ABNORMAL HIGH (ref 70–99)
Potassium: 3.5 mEq/L (ref 3.5–5.1)
Sodium: 137 mEq/L (ref 135–145)

## 2021-12-26 LAB — CBC WITH DIFFERENTIAL/PLATELET
Basophils Absolute: 0 10*3/uL (ref 0.0–0.1)
Basophils Relative: 0.6 % (ref 0.0–3.0)
Eosinophils Absolute: 0.2 10*3/uL (ref 0.0–0.7)
Eosinophils Relative: 2 % (ref 0.0–5.0)
HCT: 44.5 % (ref 39.0–52.0)
Hemoglobin: 14.9 g/dL (ref 13.0–17.0)
Lymphocytes Relative: 26.3 % (ref 12.0–46.0)
Lymphs Abs: 2.2 10*3/uL (ref 0.7–4.0)
MCHC: 33.5 g/dL (ref 30.0–36.0)
MCV: 96.1 fl (ref 78.0–100.0)
Monocytes Absolute: 0.8 10*3/uL (ref 0.1–1.0)
Monocytes Relative: 10 % (ref 3.0–12.0)
Neutro Abs: 5.1 10*3/uL (ref 1.4–7.7)
Neutrophils Relative %: 61.1 % (ref 43.0–77.0)
Platelets: 195 10*3/uL (ref 150.0–400.0)
RBC: 4.63 Mil/uL (ref 4.22–5.81)
RDW: 12.8 % (ref 11.5–15.5)
WBC: 8.3 10*3/uL (ref 4.0–10.5)

## 2021-12-26 LAB — URINALYSIS, ROUTINE W REFLEX MICROSCOPIC
Bilirubin Urine: NEGATIVE
Hgb urine dipstick: NEGATIVE
Leukocytes,Ua: NEGATIVE
Nitrite: NEGATIVE
RBC / HPF: NONE SEEN (ref 0–?)
Specific Gravity, Urine: 1.01 (ref 1.000–1.030)
Urine Glucose: NEGATIVE
Urobilinogen, UA: 1 (ref 0.0–1.0)
pH: 7 (ref 5.0–8.0)

## 2021-12-26 LAB — HEPATIC FUNCTION PANEL
ALT: 14 U/L (ref 0–53)
AST: 18 U/L (ref 0–37)
Albumin: 4.3 g/dL (ref 3.5–5.2)
Alkaline Phosphatase: 72 U/L (ref 39–117)
Bilirubin, Direct: 0.2 mg/dL (ref 0.0–0.3)
Total Bilirubin: 0.8 mg/dL (ref 0.2–1.2)
Total Protein: 7.4 g/dL (ref 6.0–8.3)

## 2021-12-26 LAB — LIPID PANEL
Cholesterol: 198 mg/dL (ref 0–200)
HDL: 45 mg/dL (ref >39.00)
LDL Cholesterol: 139 mg/dL — ABNORMAL HIGH (ref 0–99)
NonHDL: 153.32
Total CHOL/HDL Ratio: 4
Triglycerides: 74 mg/dL (ref 0.0–149.0)
VLDL: 14.8 mg/dL (ref 0.0–40.0)

## 2021-12-26 LAB — HEMOGLOBIN A1C: Hgb A1c MFr Bld: 6.1 % (ref 4.6–6.5)

## 2021-12-26 LAB — TSH: TSH: 2.11 u[IU]/mL (ref 0.35–5.50)

## 2021-12-26 NOTE — Patient Instructions (Signed)
Hypertension, Adult High blood pressure (hypertension) is when the force of blood pumping through the arteries is too strong. The arteries are the blood vessels that carry blood from the heart throughout the body. Hypertension forces the heart to work harder to pump blood and may cause arteries to become narrow or stiff. Untreated or uncontrolled hypertension can lead to a heart attack, heart failure, a stroke, kidney disease, and other problems. A blood pressure reading consists of a higher number over a lower number. Ideally, your blood pressure should be below 120/80. The first ("top") number is called the systolic pressure. It is a measure of the pressure in your arteries as your heart beats. The second ("bottom") number is called the diastolic pressure. It is a measure of the pressure in your arteries as the heart relaxes. What are the causes? The exact cause of this condition is not known. There are some conditions that result in high blood pressure. What increases the risk? Certain factors may make you more likely to develop high blood pressure. Some of these risk factors are under your control, including: Smoking. Not getting enough exercise or physical activity. Being overweight. Having too much fat, sugar, calories, or salt (sodium) in your diet. Drinking too much alcohol. Other risk factors include: Having a personal history of heart disease, diabetes, high cholesterol, or kidney disease. Stress. Having a family history of high blood pressure and high cholesterol. Having obstructive sleep apnea. Age. The risk increases with age. What are the signs or symptoms? High blood pressure may not cause symptoms. Very high blood pressure (hypertensive crisis) may cause: Headache. Fast or irregular heartbeats (palpitations). Shortness of breath. Nosebleed. Nausea and vomiting. Vision changes. Severe chest pain, dizziness, and seizures. How is this diagnosed? This condition is diagnosed by  measuring your blood pressure while you are seated, with your arm resting on a flat surface, your legs uncrossed, and your feet flat on the floor. The cuff of the blood pressure monitor will be placed directly against the skin of your upper arm at the level of your heart. Blood pressure should be measured at least twice using the same arm. Certain conditions can cause a difference in blood pressure between your right and left arms. If you have a high blood pressure reading during one visit or you have normal blood pressure with other risk factors, you may be asked to: Return on a different day to have your blood pressure checked again. Monitor your blood pressure at home for 1 week or longer. If you are diagnosed with hypertension, you may have other blood or imaging tests to help your health care provider understand your overall risk for other conditions. How is this treated? This condition is treated by making healthy lifestyle changes, such as eating healthy foods, exercising more, and reducing your alcohol intake. You may be referred for counseling on a healthy diet and physical activity. Your health care provider may prescribe medicine if lifestyle changes are not enough to get your blood pressure under control and if: Your systolic blood pressure is above 130. Your diastolic blood pressure is above 80. Your personal target blood pressure may vary depending on your medical conditions, your age, and other factors. Follow these instructions at home: Eating and drinking  Eat a diet that is high in fiber and potassium, and low in sodium, added sugar, and fat. An example of this eating plan is called the DASH diet. DASH stands for Dietary Approaches to Stop Hypertension. To eat this way: Eat   plenty of fresh fruits and vegetables. Try to fill one half of your plate at each meal with fruits and vegetables. Eat whole grains, such as whole-wheat pasta, brown rice, or whole-grain bread. Fill about one  fourth of your plate with whole grains. Eat or drink low-fat dairy products, such as skim milk or low-fat yogurt. Avoid fatty cuts of meat, processed or cured meats, and poultry with skin. Fill about one fourth of your plate with lean proteins, such as fish, chicken without skin, beans, eggs, or tofu. Avoid pre-made and processed foods. These tend to be higher in sodium, added sugar, and fat. Reduce your daily sodium intake. Many people with hypertension should eat less than 1,500 mg of sodium a day. Do not drink alcohol if: Your health care provider tells you not to drink. You are pregnant, may be pregnant, or are planning to become pregnant. If you drink alcohol: Limit how much you have to: 0-1 drink a day for women. 0-2 drinks a day for men. Know how much alcohol is in your drink. In the [place] one drink equals one 12 oz bottle of beer (355 mL), one 5 oz glass of wine (148 mL), or one 1 oz glass of hard liquor (44 mL). Lifestyle  Work with your health care provider to maintain a healthy body weight or to lose weight. Ask what an ideal weight is for you. Get at least 30 minutes of exercise that causes your heart to beat faster (aerobic exercise) most days of the week. Activities may include walking, swimming, or biking. Include exercise to strengthen your muscles (resistance exercise), such as Pilates or lifting weights, as part of your weekly exercise routine. Try to do these types of exercises for 30 minutes at least 3 days a week. Do not use any products that contain nicotine or tobacco. These products include cigarettes, chewing tobacco, and vaping devices, such as e-cigarettes. If you need help quitting, ask your health care provider. Monitor your blood pressure at home as told by your health care provider. Keep all follow-up visits. This is important. Medicines Take over-the-counter and prescription medicines only as told by your health care provider. Follow directions carefully. Blood  pressure medicines must be taken as prescribed. Do not skip doses of blood pressure medicine. Doing this puts you at risk for problems and can make the medicine less effective. Ask your health care provider about side effects or reactions to medicines that you should watch for. Contact a health care provider if you: Think you are having a reaction to a medicine you are taking. Have headaches that keep coming back (recurring). Feel dizzy. Have swelling in your ankles. Have trouble with your vision. Get help right away if you: Develop a severe headache or confusion. Have unusual weakness or numbness. Feel faint. Have severe pain in your chest or abdomen. Vomit repeatedly. Have trouble breathing. These symptoms may be an emergency. Get help right away. Call 911. Do not wait to see if the symptoms will go away. Do not drive yourself to the hospital. Summary Hypertension is when the force of blood pumping through your arteries is too strong. If this condition is not controlled, it may put you at risk for serious complications. Your personal target blood pressure may vary depending on your medical conditions, your age, and other factors. For most people, a normal blood pressure is less than 120/80. Hypertension is treated with lifestyle changes, medicines, or a combination of both. Lifestyle changes include losing weight, eating a healthy,   low-sodium diet, exercising more, and limiting alcohol. This information is not intended to replace advice given to you by your health care provider. Make sure you discuss any questions you have with your health care provider. Document Revised: 01/02/2021 Document Reviewed: 01/02/2021 [employer] Patient Education  2023 [employer]  
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
## 2022-01-22 ENCOUNTER — Other Ambulatory Visit: Payer: Self-pay | Admitting: Internal Medicine

## 2022-01-22 DIAGNOSIS — N402 Nodular prostate without lower urinary tract symptoms: Secondary | ICD-10-CM

## 2022-04-22 ENCOUNTER — Telehealth: Payer: Self-pay | Admitting: Internal Medicine

## 2022-04-22 NOTE — Telephone Encounter (Signed)
PT visits today with a disability placard form to be filled out by Dr.Jones. This form has been placed in Dr.Jones' mailbox and they would like to be notified once it is filled out.  CB: [phone]

## 2022-04-23 NOTE — Telephone Encounter (Signed)
Pt has been informed that form is ready for pick up.   Located at the front desk.

## 2022-04-23 NOTE — Telephone Encounter (Signed)
Form given to PCP to sign

## 2022-06-08 ENCOUNTER — Other Ambulatory Visit: Payer: Self-pay | Admitting: Internal Medicine

## 2022-06-08 ENCOUNTER — Other Ambulatory Visit: Payer: Self-pay | Admitting: Cardiology

## 2022-06-08 DIAGNOSIS — N402 Nodular prostate without lower urinary tract symptoms: Secondary | ICD-10-CM

## 2022-06-08 DIAGNOSIS — I10 Essential (primary) hypertension: Secondary | ICD-10-CM

## 2022-07-02 ENCOUNTER — Ambulatory Visit (INDEPENDENT_AMBULATORY_CARE_PROVIDER_SITE_OTHER): Payer: Medicare Other | Admitting: Internal Medicine

## 2022-07-02 ENCOUNTER — Ambulatory Visit: Payer: Medicare Other | Admitting: Internal Medicine

## 2022-07-02 ENCOUNTER — Encounter: Payer: Self-pay | Admitting: Internal Medicine

## 2022-07-02 VITALS — BP 142/86 | HR 74 | Temp 97.7°F | Ht 65.0 in | Wt 164.0 lb

## 2022-07-02 DIAGNOSIS — I10 Essential (primary) hypertension: Secondary | ICD-10-CM

## 2022-07-02 DIAGNOSIS — I700 Atherosclerosis of aorta: Secondary | ICD-10-CM

## 2022-07-02 DIAGNOSIS — I482 Chronic atrial fibrillation, unspecified: Secondary | ICD-10-CM

## 2022-07-02 DIAGNOSIS — N1831 Chronic kidney disease, stage 3a: Secondary | ICD-10-CM

## 2022-07-02 DIAGNOSIS — R7303 Prediabetes: Secondary | ICD-10-CM

## 2022-07-02 DIAGNOSIS — E039 Hypothyroidism, unspecified: Secondary | ICD-10-CM

## 2022-07-02 LAB — CBC WITH DIFFERENTIAL/PLATELET
Basophils Absolute: 0.1 10*3/uL (ref 0.0–0.1)
Basophils Relative: 0.7 % (ref 0.0–3.0)
Eosinophils Absolute: 0.2 10*3/uL (ref 0.0–0.7)
Eosinophils Relative: 2 % (ref 0.0–5.0)
HCT: 49.2 % (ref 39.0–52.0)
Hemoglobin: 16.6 g/dL (ref 13.0–17.0)
Lymphocytes Relative: 26.9 % (ref 12.0–46.0)
Lymphs Abs: 2.3 10*3/uL (ref 0.7–4.0)
MCHC: 33.7 g/dL (ref 30.0–36.0)
MCV: 95.5 fl (ref 78.0–100.0)
Monocytes Absolute: 1 10*3/uL (ref 0.1–1.0)
Monocytes Relative: 11.9 % (ref 3.0–12.0)
Neutro Abs: 5 10*3/uL (ref 1.4–7.7)
Neutrophils Relative %: 58.5 % (ref 43.0–77.0)
Platelets: 185 10*3/uL (ref 150.0–400.0)
RBC: 5.15 Mil/uL (ref 4.22–5.81)
RDW: 12.7 % (ref 11.5–15.5)
WBC: 8.6 10*3/uL (ref 4.0–10.5)

## 2022-07-02 LAB — TSH: TSH: 4.15 u[IU]/mL (ref 0.35–5.50)

## 2022-07-02 LAB — BASIC METABOLIC PANEL
BUN: 14 mg/dL (ref 6–23)
CO2: 28 mEq/L (ref 19–32)
Calcium: 9.5 mg/dL (ref 8.4–10.5)
Chloride: 102 mEq/L (ref 96–112)
Creatinine, Ser: 1.09 mg/dL (ref 0.40–1.50)
GFR: 65.12 mL/min (ref >60.00)
Glucose, Bld: 130 mg/dL — ABNORMAL HIGH (ref 70–99)
Potassium: 3.7 mEq/L (ref 3.5–5.1)
Sodium: 137 mEq/L (ref 135–145)

## 2022-07-02 LAB — URINALYSIS, ROUTINE W REFLEX MICROSCOPIC
Bilirubin Urine: NEGATIVE
Hgb urine dipstick: NEGATIVE
Ketones, ur: NEGATIVE
Leukocytes,Ua: NEGATIVE
Nitrite: NEGATIVE
RBC / HPF: NONE SEEN (ref 0–?)
Specific Gravity, Urine: 1.01 (ref 1.000–1.030)
Total Protein, Urine: NEGATIVE
Urine Glucose: NEGATIVE
Urobilinogen, UA: 1 (ref 0.0–1.0)
pH: 7.5 (ref 5.0–8.0)

## 2022-07-02 LAB — HEMOGLOBIN A1C: Hgb A1c MFr Bld: 5.6 % (ref 4.6–6.5)

## 2022-07-02 NOTE — Patient Instructions (Signed)
Hypertension, Adult High blood pressure (hypertension) is when the force of blood pumping through the arteries is too strong. The arteries are the blood vessels that carry blood from the heart throughout the body. Hypertension forces the heart to work harder to pump blood and may cause arteries to become narrow or stiff. Untreated or uncontrolled hypertension can lead to a heart attack, heart failure, a stroke, kidney disease, and other problems. A blood pressure reading consists of a higher number over a lower number. Ideally, your blood pressure should be below 120/80. The first ("top") number is called the systolic pressure. It is a measure of the pressure in your arteries as your heart beats. The second ("bottom") number is called the diastolic pressure. It is a measure of the pressure in your arteries as the heart relaxes. What are the causes? The exact cause of this condition is not known. There are some conditions that result in high blood pressure. What increases the risk? Certain factors may make you more likely to develop high blood pressure. Some of these risk factors are under your control, including: Smoking. Not getting enough exercise or physical activity. Being overweight. Having too much fat, sugar, calories, or salt (sodium) in your diet. Drinking too much alcohol. Other risk factors include: Having a personal history of heart disease, diabetes, high cholesterol, or kidney disease. Stress. Having a family history of high blood pressure and high cholesterol. Having obstructive sleep apnea. Age. The risk increases with age. What are the signs or symptoms? High blood pressure may not cause symptoms. Very high blood pressure (hypertensive crisis) may cause: Headache. Fast or irregular heartbeats (palpitations). Shortness of breath. Nosebleed. Nausea and vomiting. Vision changes. Severe chest pain, dizziness, and seizures. How is this diagnosed? This condition is diagnosed by  measuring your blood pressure while you are seated, with your arm resting on a flat surface, your legs uncrossed, and your feet flat on the floor. The cuff of the blood pressure monitor will be placed directly against the skin of your upper arm at the level of your heart. Blood pressure should be measured at least twice using the same arm. Certain conditions can cause a difference in blood pressure between your right and left arms. If you have a high blood pressure reading during one visit or you have normal blood pressure with other risk factors, you may be asked to: Return on a different day to have your blood pressure checked again. Monitor your blood pressure at home for 1 week or longer. If you are diagnosed with hypertension, you may have other blood or imaging tests to help your health care provider understand your overall risk for other conditions. How is this treated? This condition is treated by making healthy lifestyle changes, such as eating healthy foods, exercising more, and reducing your alcohol intake. You may be referred for counseling on a healthy diet and physical activity. Your health care provider may prescribe medicine if lifestyle changes are not enough to get your blood pressure under control and if: Your systolic blood pressure is above 130. Your diastolic blood pressure is above 80. Your personal target blood pressure may vary depending on your medical conditions, your age, and other factors. Follow these instructions at home: Eating and drinking  Eat a diet that is high in fiber and potassium, and low in sodium, added sugar, and fat. An example of this eating plan is called the DASH diet. DASH stands for Dietary Approaches to Stop Hypertension. To eat this way: Eat   plenty of fresh fruits and vegetables. Try to fill one half of your plate at each meal with fruits and vegetables. Eat whole grains, such as whole-wheat pasta, brown rice, or whole-grain bread. Fill about one  fourth of your plate with whole grains. Eat or drink low-fat dairy products, such as skim milk or low-fat yogurt. Avoid fatty cuts of meat, processed or cured meats, and poultry with skin. Fill about one fourth of your plate with lean proteins, such as fish, chicken without skin, beans, eggs, or tofu. Avoid pre-made and processed foods. These tend to be higher in sodium, added sugar, and fat. Reduce your daily sodium intake. Many people with hypertension should eat less than 1,500 mg of sodium a day. Do not drink alcohol if: Your health care provider tells you not to drink. You are pregnant, may be pregnant, or are planning to become pregnant. If you drink alcohol: Limit how much you have to: 0-1 drink a day for women. 0-2 drinks a day for men. Know how much alcohol is in your drink. In the [place] one drink equals one 12 oz bottle of beer (355 mL), one 5 oz glass of wine (148 mL), or one 1 oz glass of hard liquor (44 mL). Lifestyle  Work with your health care provider to maintain a healthy body weight or to lose weight. Ask what an ideal weight is for you. Get at least 30 minutes of exercise that causes your heart to beat faster (aerobic exercise) most days of the week. Activities may include walking, swimming, or biking. Include exercise to strengthen your muscles (resistance exercise), such as Pilates or lifting weights, as part of your weekly exercise routine. Try to do these types of exercises for 30 minutes at least 3 days a week. Do not use any products that contain nicotine or tobacco. These products include cigarettes, chewing tobacco, and vaping devices, such as e-cigarettes. If you need help quitting, ask your health care provider. Monitor your blood pressure at home as told by your health care provider. Keep all follow-up visits. This is important. Medicines Take over-the-counter and prescription medicines only as told by your health care provider. Follow directions carefully. Blood  pressure medicines must be taken as prescribed. Do not skip doses of blood pressure medicine. Doing this puts you at risk for problems and can make the medicine less effective. Ask your health care provider about side effects or reactions to medicines that you should watch for. Contact a health care provider if you: Think you are having a reaction to a medicine you are taking. Have headaches that keep coming back (recurring). Feel dizzy. Have swelling in your ankles. Have trouble with your vision. Get help right away if you: Develop a severe headache or confusion. Have unusual weakness or numbness. Feel faint. Have severe pain in your chest or abdomen. Vomit repeatedly. Have trouble breathing. These symptoms may be an emergency. Get help right away. Call 911. Do not wait to see if the symptoms will go away. Do not drive yourself to the hospital. Summary Hypertension is when the force of blood pumping through your arteries is too strong. If this condition is not controlled, it may put you at risk for serious complications. Your personal target blood pressure may vary depending on your medical conditions, your age, and other factors. For most people, a normal blood pressure is less than 120/80. Hypertension is treated with lifestyle changes, medicines, or a combination of both. Lifestyle changes include losing weight, eating a healthy,   low-sodium diet, exercising more, and limiting alcohol. This information is not intended to replace advice given to you by your health care provider. Make sure you discuss any questions you have with your health care provider. Document Revised: 01/02/2021 Document Reviewed: 01/02/2021 [employer] Patient Education  2023 [employer]  
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
## 2022-07-06 MED ORDER — APIXABAN 5 MG PO TABS: 5.0000 mg | ORAL_TABLET | Freq: Two times a day (BID) | ORAL | 0 refills | Status: DC

## 2022-07-06 NOTE — Assessment & Plan Note (Signed)
He has good rate control. Will continue the DOAC.

## 2022-07-25 NOTE — Progress Notes (Signed)
HPI:FU Atrial fibrillation; history of an acute anterior myocardial infarction occurring in the setting of atrial fibrillation, felt secondary to an embolic event. His LV function is preserved. We have been treating with rate control and anticoagulation at his request. His previous cardiac catheterization in August of 2009 revealed an occluded LAD but no other obstructive disease. The LAD occlusion was felt secondary to an embolus and resolved with thrombus aspiration. Abdominal ultrasound in October 2014 showed no aneurysm. Echocardiogram repeated in November 2014 and showed normal LV function, mild left ventricular hypertrophy and moderate left atrial enlargement. Carotid Dopplers in Nov 2015 showed no significant stenosis. Since I last saw him, the patient denies any dyspnea on exertion, orthopnea, PND, pedal edema, palpitations, syncope or chest pain.   Current Outpatient Medications  Medication Sig Dispense Refill   apixaban (ELIQUIS) 5 MG TABS tablet Take 1 tablet (5 mg total) by mouth 2 (two) times daily. 180 tablet 0   Capsaicin 0.1 % CREA Apply topically. Apply small amount to affected area at bedtime to both feet     Difluprednate 0.05 % EMUL Apply to eye.     diltiazem (CARDIZEM CD) 300 MG 24 hr capsule TAKE 1 CAPSULE BY MOUTH EVERY DAY 90 capsule 0   dorzolamide-timolol (COSOPT) 2-0.5 % ophthalmic solution 1 drop 2 (two) times daily.     ketoconazole (NIZORAL) 2 % shampoo Apply 1 application topically.     levothyroxine (SYNTHROID) 50 MCG tablet Take 50 mcg by mouth daily before breakfast.     selenium sulfide (SELSUN) 2.5 % shampoo Apply 1 application topically daily as needed for irritation. apply small amount of affected area every other day.     tamsulosin (FLOMAX) 0.4 MG CAPS capsule TAKE 1 CAPSULE(0.4 MG) BY MOUTH DAILY 90 capsule 1   triamcinolone cream (KENALOG) 0.5 % APPLY EXTERNALLY TO THE AFFECTED AREA TWICE DAILY 60 g 2   Ciclopirox 0.77 % gel Apply 1 Act topically 2  (two) times daily. (Patient not taking: Reported on 08/06/2022) 45 g 3   scopolamine (TRANSDERM-SCOP) 1 MG/3DAYS Place 1 patch (1.5 mg total) onto the skin every 3 (three) days. (Patient not taking: Reported on 08/06/2022) 4 patch 0   No current facility-administered medications for this visit.     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[facility])    Presumed secondary to embolus from atrial fibrillation   Arthritis    Atrial fibrillation (HCC)    Cataract    right eye    Glaucoma    Hard of hearing    Hemorrhoid    Hx of adenomatous colonic polyps 07/2008   Hyperlipidemia    Hypertension     Past Surgical History:  Procedure Laterality Date   COLONOSCOPY     POLYPECTOMY     STAPEDES SURGERY      Social History   Socioeconomic History   Marital status: Married    Spouse name: Not on file   Number of children: 6   Years of education: Not on file   Highest education level: Not on file  Occupational History   Occupation: Retired    Associate Professor: RETIRED  Tobacco Use   Smoking status: Former   Smokeless tobacco: Never   Tobacco comments:    quit x 35 years ago  Substance and Sexual Activity   Alcohol use: No    Alcohol/week: 0.0 standard drinks of alcohol   Drug use: No   Sexual activity: Never  Other  Topics Concern   Not on file  Social History Narrative   Lives with spouse   Social Determinants of Health   Financial Resource Strain: Low Risk  (11/30/2021)   Overall Financial Resource Strain (CARDIA)    Difficulty of Paying Living Expenses: Not hard at all  Food Insecurity: No Food Insecurity (11/30/2021)   Hunger Vital Sign    Worried About Running Out of Food in the Last Year: Never true    Ran Out of Food in the Last Year: Never true  Transportation Needs: No Transportation Needs (11/30/2021)   PRAPARE - Administrator, Civil Service (Medical): No    Lack of Transportation (Non-Medical): No  Physical Activity:  Sufficiently Active (11/30/2021)   Exercise Vital Sign    Days of Exercise per Week: 3 days    Minutes of Exercise per Session: 60 min  Stress: No Stress Concern Present (11/30/2021)   [employer] of Occupational Health - Occupational Stress Questionnaire    Feeling of Stress : Not at all  Social Connections: Moderately Integrated (11/30/2021)   Social Connection and Isolation Panel [NHANES]    Frequency of Communication with Friends and Family: More than three times a week    Frequency of Social Gatherings with Friends and Family: More than three times a week    Attends Religious Services: More than 4 times per year    Active Member of [employer] or Organizations: No    Attends Banker Meetings: Never    Marital Status: Married  Catering manager Violence: Not At Risk (11/30/2021)   Humiliation, Afraid, Rape, and Kick questionnaire    Fear of Current or Ex-Partner: No    Emotionally Abused: No    Physically Abused: No    Sexually Abused: No    Family History  Problem Relation Age of Onset   Heart disease Mother    Cancer Neg Hx    Stroke Neg Hx    Hypertension Neg Hx    Hyperlipidemia Neg Hx    Diabetes Neg Hx    Esophageal cancer Neg Hx    Colon cancer Neg Hx    Pancreatic cancer Neg Hx    Stomach cancer Neg Hx     ROS: no fevers or chills, productive cough, hemoptysis, dysphasia, odynophagia, melena, hematochezia, dysuria, hematuria, rash, seizure activity, orthopnea, PND, pedal edema, claudication. Remaining systems are negative.  Physical Exam: Well-developed well-nourished in no acute distress.  Skin is warm and dry.  HEENT is normal.  Neck is supple.  Chest is clear to auscultation with normal expansion.  Cardiovascular exam is irregular Abdominal exam nontender or distended. No masses palpated. Extremities show trace edema. neuro grossly intact  A/P  1 permanent atrial fibrillation-continue Cardizem and apixaban at present dose.  Will repeat  echocardiogram.    2 hypertension-blood pressure elevated.  We discussed adding additional medications for improved control but he declined.  3 hyperlipidemia-patient is intolerant to statins.  He declines any other lipid-lowering medications.  Olga Millers, MD

## 2022-08-06 ENCOUNTER — Encounter: Payer: Self-pay | Admitting: Cardiology

## 2022-08-06 ENCOUNTER — Ambulatory Visit: Payer: Medicare Other | Attending: Cardiology | Admitting: Cardiology

## 2022-08-06 VITALS — BP 160/70 | HR 63 | Ht 65.0 in | Wt 167.0 lb

## 2022-08-06 DIAGNOSIS — E7800 Pure hypercholesterolemia, unspecified: Secondary | ICD-10-CM | POA: Insufficient documentation

## 2022-08-06 DIAGNOSIS — I4821 Permanent atrial fibrillation: Secondary | ICD-10-CM | POA: Insufficient documentation

## 2022-08-06 DIAGNOSIS — I10 Essential (primary) hypertension: Secondary | ICD-10-CM | POA: Insufficient documentation

## 2022-08-06 NOTE — Patient Instructions (Signed)
    Follow-Up: At [facility], you and your health needs are our priority.  As part of our continuing mission to provide you with exceptional heart care, we have created designated Provider Care Teams.  These Care Teams include your primary Cardiologist (physician) and Advanced Practice Providers (APPs -  Physician Assistants and Nurse Practitioners) who all work together to provide you with the care you need, when you need it.  We recommend signing up for the patient portal called "MyChart".  Sign up information is provided on this After Visit Summary.  MyChart is used to connect with patients for Virtual Visits (Telemedicine).  Patients are able to view lab/test results, encounter notes, upcoming appointments, etc.  Non-urgent messages can be sent to your provider as well.   To learn more about what you can do with MyChart, go to [email or web address].    Your next appointment:   12 month(s)  Provider:   Brian Crenshaw, MD     

## 2022-09-06 ENCOUNTER — Other Ambulatory Visit: Payer: Self-pay | Admitting: Cardiology

## 2022-09-06 DIAGNOSIS — I10 Essential (primary) hypertension: Secondary | ICD-10-CM

## 2022-10-24 ENCOUNTER — Encounter (INDEPENDENT_AMBULATORY_CARE_PROVIDER_SITE_OTHER): Payer: Self-pay

## 2022-12-02 ENCOUNTER — Ambulatory Visit (INDEPENDENT_AMBULATORY_CARE_PROVIDER_SITE_OTHER): Payer: Medicare Other

## 2022-12-02 VITALS — Ht 65.0 in | Wt 167.0 lb

## 2022-12-02 DIAGNOSIS — Z0000 Encounter for general adult medical examination without abnormal findings: Secondary | ICD-10-CM

## 2022-12-02 NOTE — Progress Notes (Signed)
Subjective:   [name] is a 77 y.o. male who presents for Medicare Annual/Subsequent preventive examination.  Visit Complete: Virtual  I connected with  [name] on 12/02/22 by a audio enabled telemedicine application and verified that I am speaking with the correct person using two identifiers.  Patient Location: Home  Provider Location: Home Office  I discussed the limitations of evaluation and management by telemedicine. The patient expressed understanding and agreed to proceed.  Vital Signs: Because this visit was a virtual/telehealth visit, some criteria may be missing or patient reported. Any vitals not documented were not able to be obtained and vitals that have been documented are patient reported.    Cardiac Risk Factors include: advanced age (>91men, >77 women);hypertension;dyslipidemia;male gender;Other (see comment), Risk factor comments: CAD, Cerebrovascular disease, A-Fib     Objective:    Today's Vitals   12/02/22 0845  Weight: 167 lb (75.8 kg)  Height: 5\' 5"  (1.651 m)   Body mass index is 27.79 kg/m.     12/02/2022    8:51 AM 11/30/2021    8:31 AM 11/23/2020    2:41 PM 03/05/2017    9:33 AM 09/23/2015    2:13 PM 09/15/2014    9:04 AM  Advanced Directives  Does Patient Have a Medical Advance Directive? Yes Yes Yes No Yes No  Type of Estate agent of [place];Living will Healthcare Power of [place];Living will Living will;Healthcare Power of [employer] Power of [place];Living will   Does patient want to make changes to medical advance directive? No - Patient declined  No - Patient declined  No - Patient declined   Copy of Healthcare Power of Attorney in Chart? Yes - validated most recent copy scanned in chart (See row information) No - copy requested No - copy requested  Yes   Would patient like information on creating a medical advance directive?    No - Patient declined      Current Medications  (verified) Outpatient Encounter Medications as of 12/02/2022  Medication Sig   apixaban (ELIQUIS) 5 MG TABS tablet Take 1 tablet (5 mg total) by mouth 2 (two) times daily.   Capsaicin 0.1 % CREA Apply topically. Apply small amount to affected area at bedtime to both feet   Difluprednate 0.05 % EMUL Apply to eye.   diltiazem (CARDIZEM CD) 300 MG 24 hr capsule TAKE 1 CAPSULE BY MOUTH EVERY DAY   dorzolamide-timolol (COSOPT) 2-0.5 % ophthalmic solution 1 drop 2 (two) times daily.   ketoconazole (NIZORAL) 2 % shampoo Apply 1 application topically.   levothyroxine (SYNTHROID) 50 MCG tablet Take 50 mcg by mouth daily before breakfast.   selenium sulfide (SELSUN) 2.5 % shampoo Apply 1 application topically daily as needed for irritation. apply small amount of affected area every other day.   tamsulosin (FLOMAX) 0.4 MG CAPS capsule TAKE 1 CAPSULE(0.4 MG) BY MOUTH DAILY   triamcinolone cream (KENALOG) 0.5 % APPLY EXTERNALLY TO THE AFFECTED AREA TWICE DAILY   Ciclopirox 0.77 % gel Apply 1 Act topically 2 (two) times daily. (Patient not taking: Reported on 08/06/2022)   scopolamine (TRANSDERM-SCOP) 1 MG/3DAYS Place 1 patch (1.5 mg total) onto the skin every 3 (three) days. (Patient not taking: Reported on 08/06/2022)   No facility-administered encounter medications on file as of 12/02/2022.    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 fibrillation   Arthritis    Atrial fibrillation (HCC)    Cataract    right eye    Glaucoma    Hard of hearing    Hemorrhoid    Hx of adenomatous colonic polyps 07/2008   Hyperlipidemia    Hypertension    Past Surgical History:  Procedure Laterality Date   COLONOSCOPY     POLYPECTOMY     STAPEDES SURGERY     Family History  Problem Relation Age of Onset    Heart disease Mother    Cancer Neg Hx    Stroke Neg Hx    Hypertension Neg Hx    Hyperlipidemia Neg Hx    Diabetes Neg Hx    Esophageal cancer Neg Hx    Colon cancer Neg Hx    Pancreatic cancer Neg Hx    Stomach cancer Neg Hx    Social History   Socioeconomic History   Marital status: Married    Spouse name: [name]   Number of children: 6   Years of education: Not on file   Highest education level: Not on file  Occupational History   Occupation: Retired    Associate Professor: RETIRED  Tobacco Use   Smoking status: Former   Smokeless tobacco: Never   Tobacco comments:    quit x 35 years ago  Vaping Use   Vaping status: Never Used  Substance and Sexual Activity   Alcohol use: No    Alcohol/week: 0.0 standard drinks of alcohol   Drug use: No   Sexual activity: Never  Other Topics Concern   Not on file  Social History Narrative   Lives with spouse   Social Determinants of Health   Financial Resource Strain: Low Risk  (12/02/2022)   Overall Financial Resource Strain (CARDIA)    Difficulty of Paying Living Expenses: Not hard at all  Food Insecurity: No Food Insecurity (12/02/2022)   Hunger Vital Sign    Worried About Running Out of Food in the Last Year: Never true    Ran Out of Food in the Last Year: Never true  Transportation Needs: No Transportation Needs (12/02/2022)   PRAPARE - Administrator, Civil Service (Medical): No    Lack of Transportation (Non-Medical): No  Physical Activity: Sufficiently Active (12/02/2022)   Exercise Vital Sign    Days of Exercise per Week: 3 days    Minutes of Exercise per Session: 60 min  Stress: No Stress Concern Present (12/02/2022)   [employer] of Occupational Health - Occupational Stress Questionnaire    Feeling of Stress : Not at all  Social Connections: Moderately Integrated (12/02/2022)   Social Connection and Isolation Panel [NHANES]    Frequency of Communication with Friends and Family: Never    Frequency of Social  Gatherings with Friends and Family: More than three times a week    Attends Religious Services: More than 4 times per year    Active Member of [employer] or Organizations: No    Attends Engineer, structural: Never    Marital Status: Married    Tobacco Counseling Counseling given: Not Answered Tobacco comments: quit x 35 years ago   Clinical Intake:  Pre-visit preparation completed: Yes  Pain : No/denies pain     BMI - recorded: 27.79 Nutritional Status: BMI 25 -29 Overweight Nutritional Risks: None Diabetes: No  How often do you need to have someone help you when you read instructions, pamphlets, or other written materials  from your doctor or pharmacy?: 1 - Never  Interpreter Needed?: No  Comments: Pt's wife did have to help with visit today Information entered by :: Laverne Hursey, RMA   Activities of Daily Living    12/02/2022    8:48 AM  In your present state of health, do you have any difficulty performing the following activities:  Hearing? 1  Vision? 0  Difficulty concentrating or making decisions? 0  Walking or climbing stairs? 0  Dressing or bathing? 0  Doing errands, shopping? 0  Preparing Food and eating ? N  Using the Toilet? N  In the past six months, have you accidently leaked urine? N  Do you have problems with loss of bowel control? N  Managing your Medications? N  Managing your Finances? N  Housekeeping or managing your Housekeeping? N    Patient Care Team: Etta Grandchild, MD as PCP - General (Internal Medicine) Jens Som Madolyn Frieze, MD as PCP - Cardiology (Cardiology) Kathyrn Sheriff, [facility] (Inactive) as Pharmacist (Pharmacist)  Indicate any recent Medical Services you may have received from other than [facility] providers in the past year (date may be approximate).     Assessment:   This is a routine wellness examination for [place].  Hearing/Vision screen Hearing Screening - Comments:: Lt ear hearing implant Vision Screening - Comments::  Wears eyeglasses   Goals Addressed               This Visit's Progress     Patient Stated (pt-stated)   On track     My goal is to continue to be healthy and enjoy life.      Depression Screen    12/02/2022    8:53 AM 11/30/2021    8:29 AM 11/23/2020    2:40 PM 07/27/2020    9:50 AM 07/01/2019    2:19 PM 06/01/2018    4:14 PM 06/01/2018    1:07 PM  PHQ 2/9 Scores  PHQ - 2 Score 0 0 0 0 0 0 0  PHQ- 9 Score 0          Fall Risk    12/02/2022    8:51 AM 11/30/2021    8:27 AM 11/23/2020    2:43 PM 07/27/2020    9:46 AM 07/01/2019    2:18 PM  Fall Risk   Falls in the past year? 0 0 0 0 0  Number falls in past yr: 0 0 0  0  Injury with Fall? 0 0 0  0  Risk for fall due to : No Fall Risks No Fall Risks No Fall Risks  No Fall Risks  Follow up Falls prevention discussed;Falls evaluation completed Falls prevention discussed Falls evaluation completed  Falls evaluation completed    MEDICARE RISK AT HOME: Medicare Risk at Home Any stairs in or around the home?: Yes If so, are there any without handrails?: Yes Home free of loose throw rugs in walkways, pet beds, electrical cords, etc?: Yes Adequate lighting in your home to reduce risk of falls?: Yes Life alert?: No Use of a cane, walker or w/c?: No Grab bars in the bathroom?: Yes Shower chair or bench in shower?: No Elevated toilet seat or a handicapped toilet?: No  TIMED UP AND GO:  Was the test performed?  No    Cognitive Function:        12/02/2022    8:51 AM 11/30/2021    8:31 AM  6CIT Screen  What Year? 0 points 0 points  What  month? 0 points 0 points  What time? 0 points 0 points  Count back from 20 0 points 0 points  Months in reverse 0 points 0 points  Repeat phrase 0 points 0 points  Total Score 0 points 0 points    Immunizations Immunization History  Administered Date(s) Administered   Pneumococcal Conjugate-13 09/15/2014   Pneumococcal Polysaccharide-23 12/20/2009, 04/05/2020   Td 03/11/1996,  06/29/2008   Tdap 01/03/2020   Zoster Recombinant(Shingrix) 04/05/2020, 06/16/2020    TDAP status: Up to date  Flu Vaccine status: Declined, Education has been provided regarding the importance of this vaccine but patient still declined. Advised may receive this vaccine at local pharmacy or Health Dept. Aware to provide a copy of the vaccination record if obtained from local pharmacy or Health Dept. Verbalized acceptance and understanding.  Pneumococcal vaccine status: Up to date  Covid-19 vaccine status: Declined, Education has been provided regarding the importance of this vaccine but patient still declined. Advised may receive this vaccine at local pharmacy or Health Dept.or vaccine clinic. Aware to provide a copy of the vaccination record if obtained from local pharmacy or Health Dept. Verbalized acceptance and understanding.  Qualifies for Shingles Vaccine? Yes   Zostavax completed Yes   Shingrix Completed?: Yes  Screening Tests Health Maintenance  Topic Date Due   Diabetic kidney evaluation - Urine ACR  07/27/2021   FOOT EXAM  07/27/2021   INFLUENZA VACCINE  10/10/2022   COVID-19 Vaccine (1 - 2023-24 season) Never done   Colonoscopy  07/02/2023 (Originally 08/07/2019)   OPHTHALMOLOGY EXAM  12/26/2022   HEMOGLOBIN A1C  01/01/2023   Diabetic kidney evaluation - eGFR measurement  07/02/2023   Medicare Annual Wellness (AWV)  12/02/2023   DTaP/Tdap/Td (4 - Td or Tdap) 01/02/2030   Pneumonia Vaccine 74+ Years old  Completed   Hepatitis C Screening  Completed   Zoster Vaccines- Shingrix  Completed   HPV VACCINES  Aged Out    Health Maintenance  Health Maintenance Due  Topic Date Due   Diabetic kidney evaluation - Urine ACR  07/27/2021   FOOT EXAM  07/27/2021   INFLUENZA VACCINE  10/10/2022   COVID-19 Vaccine (1 - 2023-24 season) Never done    Colorectal cancer screening: Type of screening: Colonoscopy. Completed 08/06/2016. Repeat every 5 years  Lung Cancer Screening:  (Low Dose CT Chest recommended if Age 57-80 years, 20 pack-year currently smoking OR have quit w/in 15years.) does not qualify.   Lung Cancer Screening Referral: N/A  Additional Screening:  Hepatitis C Screening: does qualify; Completed 09/22/2015  Vision Screening: Recommended annual ophthalmology exams for early detection of glaucoma and other disorders of the eye. Is the patient up to date with their annual eye exam?  Yes  Who is the provider or what is the name of the office in which the patient attends annual eye exams? V/A If pt is not established with a provider, would they like to be referred to a provider to establish care? No .   Dental Screening: Recommended annual dental exams for proper oral hygiene   Community Resource Referral / Chronic Care Management: CRR required this visit?  No   CCM required this visit?  No     Plan:     I have personally reviewed and noted the following in the patient's chart:   Medical and social history Use of alcohol, tobacco or illicit drugs  Current medications and supplements including opioid prescriptions. Patient is not currently taking opioid prescriptions. Functional ability and status  Nutritional status Physical activity Advanced directives List of other physicians Hospitalizations, surgeries, and ER visits in previous 12 months Vitals Screenings to include cognitive, depression, and falls Referrals and appointments  In addition, I have reviewed and discussed with patient certain preventive protocols, quality metrics, and best practice recommendations. A written personalized care plan for preventive services as well as general preventive health recommendations were provided to patient.     Shronda Boeh L Rechy Bost, CMA   12/02/2022   After Visit Summary: (MyChart) Due to this being a telephonic visit, the after visit summary with patients personalized plan was offered to patient via MyChart   Nurse Notes: Patient declines Flu and  Covid vaccines.  He is up to date on all other Health Maintenance.  He had no other concerns to address today.

## 2022-12-02 NOTE — Patient Instructions (Signed)
Mr. Grupe , Thank you for taking time to come for your Medicare Wellness Visit. I appreciate your ongoing commitment to your health goals. Please review the following plan we discussed and let me know if I can assist you in the future.   Referrals/Orders/Follow-Ups/Clinician Recommendations: Each day, aim for 6 glasses of water, plenty of protein in your diet and try to get up and walk/ stretch every hour for 5-10 minutes at a time.    This is a list of the screening recommended for you and due dates:  Health Maintenance  Topic Date Due   Yearly kidney health urinalysis for diabetes  07/27/2021   Complete foot exam   07/27/2021   Flu Shot  10/10/2022   COVID-19 Vaccine (1 - 2023-24 season) Never done   Colon Cancer Screening  07/02/2023*   Eye exam for diabetics  12/26/2022   Hemoglobin A1C  01/01/2023   Yearly kidney function blood test for diabetes  07/02/2023   Medicare Annual Wellness Visit  12/02/2023   DTaP/Tdap/Td vaccine (4 - Td or Tdap) 01/02/2030   Pneumonia Vaccine  Completed   Hepatitis C Screening  Completed   Zoster (Shingles) Vaccine  Completed   HPV Vaccine  Aged Out  *Topic was postponed. The date shown is not the original due date.    Advanced directives: (In Chart) A copy of your advanced directives are scanned into your chart should your provider ever need it.  Next Medicare Annual Wellness Visit scheduled for next year: Yes

## 2022-12-05 ENCOUNTER — Other Ambulatory Visit: Payer: Self-pay | Admitting: Cardiology

## 2022-12-05 DIAGNOSIS — I10 Essential (primary) hypertension: Secondary | ICD-10-CM

## 2023-01-06 ENCOUNTER — Ambulatory Visit (INDEPENDENT_AMBULATORY_CARE_PROVIDER_SITE_OTHER): Payer: Medicare Other | Admitting: Internal Medicine

## 2023-01-06 ENCOUNTER — Encounter: Payer: Self-pay | Admitting: Internal Medicine

## 2023-01-06 VITALS — BP 172/102 | HR 71 | Temp 97.9°F | Resp 16 | Ht 65.0 in | Wt 166.2 lb

## 2023-01-06 DIAGNOSIS — I482 Chronic atrial fibrillation, unspecified: Secondary | ICD-10-CM

## 2023-01-06 DIAGNOSIS — I2510 Atherosclerotic heart disease of native coronary artery without angina pectoris: Secondary | ICD-10-CM

## 2023-01-06 DIAGNOSIS — E785 Hyperlipidemia, unspecified: Secondary | ICD-10-CM | POA: Diagnosis not present

## 2023-01-06 DIAGNOSIS — I700 Atherosclerosis of aorta: Secondary | ICD-10-CM | POA: Diagnosis not present

## 2023-01-06 DIAGNOSIS — I10 Essential (primary) hypertension: Secondary | ICD-10-CM

## 2023-01-06 DIAGNOSIS — N1831 Chronic kidney disease, stage 3a: Secondary | ICD-10-CM | POA: Diagnosis not present

## 2023-01-06 DIAGNOSIS — E039 Hypothyroidism, unspecified: Secondary | ICD-10-CM

## 2023-01-06 DIAGNOSIS — E876 Hypokalemia: Secondary | ICD-10-CM

## 2023-01-06 DIAGNOSIS — T502X5A Adverse effect of carbonic-anhydrase inhibitors, benzothiadiazides and other diuretics, initial encounter: Secondary | ICD-10-CM

## 2023-01-06 DIAGNOSIS — R7303 Prediabetes: Secondary | ICD-10-CM | POA: Diagnosis not present

## 2023-01-06 MED ORDER — SPIRONOLACTONE 25 MG PO TABS: 25.0000 mg | ORAL_TABLET | Freq: Every day | ORAL | 0 refills | Status: DC

## 2023-01-06 MED ORDER — APIXABAN 5 MG PO TABS: 5.0000 mg | ORAL_TABLET | Freq: Two times a day (BID) | ORAL | 0 refills | Status: DC
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
## 2023-01-06 NOTE — Patient Instructions (Signed)
Hypertension, Adult High blood pressure (hypertension) is when the force of blood pumping through the arteries is too strong. The arteries are the blood vessels that carry blood from the heart throughout the body. Hypertension forces the heart to work harder to pump blood and may cause arteries to become narrow or stiff. Untreated or uncontrolled hypertension can lead to a heart attack, heart failure, a stroke, kidney disease, and other problems. A blood pressure reading consists of a higher number over a lower number. Ideally, your blood pressure should be below 120/80. The first ("top") number is called the systolic pressure. It is a measure of the pressure in your arteries as your heart beats. The second ("bottom") number is called the diastolic pressure. It is a measure of the pressure in your arteries as the heart relaxes. What are the causes? The exact cause of this condition is not known. There are some conditions that result in high blood pressure. What increases the risk? Certain factors may make you more likely to develop high blood pressure. Some of these risk factors are under your control, including: Smoking. Not getting enough exercise or physical activity. Being overweight. Having too much fat, sugar, calories, or salt (sodium) in your diet. Drinking too much alcohol. Other risk factors include: Having a personal history of heart disease, diabetes, high cholesterol, or kidney disease. Stress. Having a family history of high blood pressure and high cholesterol. Having obstructive sleep apnea. Age. The risk increases with age. What are the signs or symptoms? High blood pressure may not cause symptoms. Very high blood pressure (hypertensive crisis) may cause: Headache. Fast or irregular heartbeats (palpitations). Shortness of breath. Nosebleed. Nausea and vomiting. Vision changes. Severe chest pain, dizziness, and seizures. How is this diagnosed? This condition is diagnosed by  measuring your blood pressure while you are seated, with your arm resting on a flat surface, your legs uncrossed, and your feet flat on the floor. The cuff of the blood pressure monitor will be placed directly against the skin of your upper arm at the level of your heart. Blood pressure should be measured at least twice using the same arm. Certain conditions can cause a difference in blood pressure between your right and left arms. If you have a high blood pressure reading during one visit or you have normal blood pressure with other risk factors, you may be asked to: Return on a different day to have your blood pressure checked again. Monitor your blood pressure at home for 1 week or longer. If you are diagnosed with hypertension, you may have other blood or imaging tests to help your health care provider understand your overall risk for other conditions. How is this treated? This condition is treated by making healthy lifestyle changes, such as eating healthy foods, exercising more, and reducing your alcohol intake. You may be referred for counseling on a healthy diet and physical activity. Your health care provider may prescribe medicine if lifestyle changes are not enough to get your blood pressure under control and if: Your systolic blood pressure is above 130. Your diastolic blood pressure is above 80. Your personal target blood pressure may vary depending on your medical conditions, your age, and other factors. Follow these instructions at home: Eating and drinking  Eat a diet that is high in fiber and potassium, and low in sodium, added sugar, and fat. An example of this eating plan is called the DASH diet. DASH stands for Dietary Approaches to Stop Hypertension. To eat this way: Eat   plenty of fresh fruits and vegetables. Try to fill one half of your plate at each meal with fruits and vegetables. Eat whole grains, such as whole-wheat pasta, brown rice, or whole-grain bread. Fill about one  fourth of your plate with whole grains. Eat or drink low-fat dairy products, such as skim milk or low-fat yogurt. Avoid fatty cuts of meat, processed or cured meats, and poultry with skin. Fill about one fourth of your plate with lean proteins, such as fish, chicken without skin, beans, eggs, or tofu. Avoid pre-made and processed foods. These tend to be higher in sodium, added sugar, and fat. Reduce your daily sodium intake. Many people with hypertension should eat less than 1,500 mg of sodium a day. Do not drink alcohol if: Your health care provider tells you not to drink. You are pregnant, may be pregnant, or are planning to become pregnant. If you drink alcohol: Limit how much you have to: 0-1 drink a day for women. 0-2 drinks a day for men. Know how much alcohol is in your drink. In the [place] one drink equals one 12 oz bottle of beer (355 mL), one 5 oz glass of wine (148 mL), or one 1 oz glass of hard liquor (44 mL). Lifestyle  Work with your health care provider to maintain a healthy body weight or to lose weight. Ask what an ideal weight is for you. Get at least 30 minutes of exercise that causes your heart to beat faster (aerobic exercise) most days of the week. Activities may include walking, swimming, or biking. Include exercise to strengthen your muscles (resistance exercise), such as Pilates or lifting weights, as part of your weekly exercise routine. Try to do these types of exercises for 30 minutes at least 3 days a week. Do not use any products that contain nicotine or tobacco. These products include cigarettes, chewing tobacco, and vaping devices, such as e-cigarettes. If you need help quitting, ask your health care provider. Monitor your blood pressure at home as told by your health care provider. Keep all follow-up visits. This is important. Medicines Take over-the-counter and prescription medicines only as told by your health care provider. Follow directions carefully. Blood  pressure medicines must be taken as prescribed. Do not skip doses of blood pressure medicine. Doing this puts you at risk for problems and can make the medicine less effective. Ask your health care provider about side effects or reactions to medicines that you should watch for. Contact a health care provider if you: Think you are having a reaction to a medicine you are taking. Have headaches that keep coming back (recurring). Feel dizzy. Have swelling in your ankles. Have trouble with your vision. Get help right away if you: Develop a severe headache or confusion. Have unusual weakness or numbness. Feel faint. Have severe pain in your chest or abdomen. Vomit repeatedly. Have trouble breathing. These symptoms may be an emergency. Get help right away. Call 911. Do not wait to see if the symptoms will go away. Do not drive yourself to the hospital. Summary Hypertension is when the force of blood pumping through your arteries is too strong. If this condition is not controlled, it may put you at risk for serious complications. Your personal target blood pressure may vary depending on your medical conditions, your age, and other factors. For most people, a normal blood pressure is less than 120/80. Hypertension is treated with lifestyle changes, medicines, or a combination of both. Lifestyle changes include losing weight, eating a healthy,   low-sodium diet, exercising more, and limiting alcohol. This information is not intended to replace advice given to you by your health care provider. Make sure you discuss any questions you have with your health care provider. Document Revised: 01/02/2021 Document Reviewed: 01/02/2021 Elsevier Patient Education  2024 [employer]  

## 2023-01-13 ENCOUNTER — Telehealth: Payer: Self-pay

## 2023-01-13 NOTE — Progress Notes (Signed)
   Care Guide Note  01/13/2023 Name: [name] MRN: [id number] DOB: 11-22-1945  Referred by: Etta Grandchild, MD Reason for referral : Care Coordination (Outreach to schedule with pharm d )   [name] is a 77 y.o. year old male who is a primary care patient of Etta Grandchild, MD. [name] was referred to the pharmacist for assistance related to HTN.    An unsuccessful telephone outreach was attempted today to contact the patient who was referred to the pharmacy team for assistance with medication management. Additional attempts will be made to contact the patient.   Penne Lash, RMA Care Guide [facility]  [place], [place] [address] Direct Dial: [phone] [email or web address]

## 2023-01-20 NOTE — Progress Notes (Signed)
   Care Guide Note  01/20/2023 Name: JEOVANNY KELDERMAN MRN: [id number] DOB: 1945/06/13  Referred by: Etta Grandchild, MD Reason for referral : Care Coordination (Outreach to schedule with pharm d )   KORREY COPPS is a 77 y.o. year old male who is a primary care patient of Etta Grandchild, MD. [name] was referred to the pharmacist for assistance related to HTN.    Successful contact was made with the patient to discuss pharmacy services. Patient declines engagement at this time. Contact information was provided to the patient should they wish to reach out for assistance at a later time.  Penne Lash, RMA Care Guide [facility]  [place], [place] [address] Direct Dial: [phone] [email or web address]

## 2023-01-20 NOTE — Progress Notes (Signed)
   Care Guide Note  01/20/2023 Name: [name] MRN: [id number] DOB: 12-01-1945  Referred by: Etta Grandchild, MD Reason for referral : Care Coordination (Outreach to schedule with pharm d )   [name] is a 77 y.o. year old male who is a primary care patient of Etta Grandchild, MD. [name] was referred to the pharmacist for assistance related to HTN.    A second unsuccessful telephone outreach was attempted today to contact the patient who was referred to the pharmacy team for assistance with medication management. Additional attempts will be made to contact the patient.  [name], RMA Care Guide [facility]  [place], [place] [address] Direct Dial: [phone] [name]

## 2023-02-28 ENCOUNTER — Other Ambulatory Visit: Payer: Self-pay | Admitting: Internal Medicine

## 2023-02-28 DIAGNOSIS — N402 Nodular prostate without lower urinary tract symptoms: Secondary | ICD-10-CM

## 2023-03-29 ENCOUNTER — Other Ambulatory Visit: Payer: Self-pay | Admitting: Internal Medicine

## 2023-03-29 DIAGNOSIS — N402 Nodular prostate without lower urinary tract symptoms: Secondary | ICD-10-CM

## 2023-03-31 ENCOUNTER — Telehealth: Payer: Self-pay | Admitting: Internal Medicine

## 2023-03-31 NOTE — Telephone Encounter (Signed)
Copied from CRM [id number]. Topic: Clinical - Prescription Issue >> Mar 31, 2023  7:39 AM Efraim Kaufmann C wrote: Reason for CRM: patient's wife called stating the doctor was supposed to have called in a prescription for patient due to blood pressure going up and down, however they have not received notification of medication being called in/ready. Please advise. Thank you

## 2023-05-05 ENCOUNTER — Other Ambulatory Visit: Payer: Self-pay | Admitting: Internal Medicine

## 2023-05-05 DIAGNOSIS — I10 Essential (primary) hypertension: Secondary | ICD-10-CM

## 2023-05-05 DIAGNOSIS — E876 Hypokalemia: Secondary | ICD-10-CM

## 2023-06-16 ENCOUNTER — Other Ambulatory Visit: Payer: Self-pay | Admitting: Internal Medicine

## 2023-06-16 DIAGNOSIS — N402 Nodular prostate without lower urinary tract symptoms: Secondary | ICD-10-CM

## 2023-08-04 ENCOUNTER — Other Ambulatory Visit: Payer: Self-pay | Admitting: Internal Medicine

## 2023-08-04 DIAGNOSIS — N402 Nodular prostate without lower urinary tract symptoms: Secondary | ICD-10-CM

## 2023-08-05 ENCOUNTER — Other Ambulatory Visit: Payer: Self-pay | Admitting: Internal Medicine

## 2023-08-05 DIAGNOSIS — N402 Nodular prostate without lower urinary tract symptoms: Secondary | ICD-10-CM

## 2023-08-05 NOTE — Telephone Encounter (Unsigned)
 Copied from CRM [id number]. Topic: Clinical - Medication Refill >> Aug 05, 2023  1:42 PM Dewanda Foots wrote: Medication: tamsulosin  (FLOMAX ) 0.4 MG CAPS capsule  Has the patient contacted their pharmacy? Yes (Agent: If no, request that the patient contact the pharmacy for the refill. If patient does not wish to contact the pharmacy document the reason why and proceed with request.) (Agent: If yes, when and what did the pharmacy advise?)  This is the patient's preferred pharmacy:  [facility] [phone] - [place], [place] - [address] AT [facility] [address] [place] [address] Phone: [phone] Fax: [phone]  Is this the correct pharmacy for this prescription? Yes If no, delete pharmacy and type the correct one.   Has the prescription been filled recently? No  Is the patient out of the medication? No  Has the patient been seen for an appointment in the last year OR does the patient have an upcoming appointment? Yes  Can we respond through MyChart? No, please call [phone]  Agent: Please be advised that Rx refills may take up to 3 business days. We ask that you follow-up with your pharmacy.

## 2023-08-22 ENCOUNTER — Ambulatory Visit (INDEPENDENT_AMBULATORY_CARE_PROVIDER_SITE_OTHER): Admitting: Internal Medicine

## 2023-08-22 ENCOUNTER — Encounter: Payer: Self-pay | Admitting: Internal Medicine

## 2023-08-22 VITALS — BP 140/88 | HR 56 | Temp 98.4°F | Ht 65.0 in | Wt 164.6 lb

## 2023-08-22 DIAGNOSIS — H4031X3 Glaucoma secondary to eye trauma, right eye, severe stage: Secondary | ICD-10-CM | POA: Insufficient documentation

## 2023-08-22 DIAGNOSIS — N402 Nodular prostate without lower urinary tract symptoms: Secondary | ICD-10-CM

## 2023-08-22 DIAGNOSIS — I10 Essential (primary) hypertension: Secondary | ICD-10-CM

## 2023-08-22 DIAGNOSIS — E039 Hypothyroidism, unspecified: Secondary | ICD-10-CM

## 2023-08-22 DIAGNOSIS — I482 Chronic atrial fibrillation, unspecified: Secondary | ICD-10-CM

## 2023-08-22 DIAGNOSIS — Z7729 Contact with and (suspected ) exposure to other hazardous substances: Secondary | ICD-10-CM | POA: Insufficient documentation

## 2023-08-22 MED ORDER — TAMSULOSIN HCL 0.4 MG PO CAPS: 0.4000 mg | ORAL_CAPSULE | Freq: Every day | ORAL | 1 refills | Status: DC

## 2023-08-22 NOTE — Patient Instructions (Signed)
 Hypertension, Adult High blood pressure (hypertension) is when the force of blood pumping through the arteries is too strong. The arteries are the blood vessels that carry blood from the heart throughout the body. Hypertension forces the heart to work harder to pump blood and may cause arteries to become narrow or stiff. Untreated or uncontrolled hypertension can lead to a heart attack, heart failure, a stroke, kidney disease, and other problems. A blood pressure reading consists of a higher number over a lower number. Ideally, your blood pressure should be below 120/80. The first ("top") number is called the systolic pressure. It is a measure of the pressure in your arteries as your heart beats. The second ("bottom") number is called the diastolic pressure. It is a measure of the pressure in your arteries as the heart relaxes. What are the causes? The exact cause of this condition is not known. There are some conditions that result in high blood pressure. What increases the risk? Certain factors may make you more likely to develop high blood pressure. Some of these risk factors are under your control, including: Smoking. Not getting enough exercise or physical activity. Being overweight. Having too much fat, sugar, calories, or salt (sodium) in your diet. Drinking too much alcohol. Other risk factors include: Having a personal history of heart disease, diabetes, high cholesterol, or kidney disease. Stress. Having a family history of high blood pressure and high cholesterol. Having obstructive sleep apnea. Age. The risk increases with age. What are the signs or symptoms? High blood pressure may not cause symptoms. Very high blood pressure (hypertensive crisis) may cause: Headache. Fast or irregular heartbeats (palpitations). Shortness of breath. Nosebleed. Nausea and vomiting. Vision changes. Severe chest pain, dizziness, and seizures. How is this diagnosed? This condition is diagnosed by  measuring your blood pressure while you are seated, with your arm resting on a flat surface, your legs uncrossed, and your feet flat on the floor. The cuff of the blood pressure monitor will be placed directly against the skin of your upper arm at the level of your heart. Blood pressure should be measured at least twice using the same arm. Certain conditions can cause a difference in blood pressure between your right and left arms. If you have a high blood pressure reading during one visit or you have normal blood pressure with other risk factors, you may be asked to: Return on a different day to have your blood pressure checked again. Monitor your blood pressure at home for 1 week or longer. If you are diagnosed with hypertension, you may have other blood or imaging tests to help your health care provider understand your overall risk for other conditions. How is this treated? This condition is treated by making healthy lifestyle changes, such as eating healthy foods, exercising more, and reducing your alcohol intake. You may be referred for counseling on a healthy diet and physical activity. Your health care provider may prescribe medicine if lifestyle changes are not enough to get your blood pressure under control and if: Your systolic blood pressure is above 130. Your diastolic blood pressure is above 80. Your personal target blood pressure may vary depending on your medical conditions, your age, and other factors. Follow these instructions at home: Eating and drinking  Eat a diet that is high in fiber and potassium, and low in sodium, added sugar, and fat. An example of this eating plan is called the DASH diet. DASH stands for Dietary Approaches to Stop Hypertension. To eat this way: Eat  plenty of fresh fruits and vegetables. Try to fill one half of your plate at each meal with fruits and vegetables. Eat whole grains, such as whole-wheat pasta, brown rice, or whole-grain bread. Fill about one  fourth of your plate with whole grains. Eat or drink low-fat dairy products, such as skim milk or low-fat yogurt. Avoid fatty cuts of meat, processed or cured meats, and poultry with skin. Fill about one fourth of your plate with lean proteins, such as fish, chicken without skin, beans, eggs, or tofu. Avoid pre-made and processed foods. These tend to be higher in sodium, added sugar, and fat. Reduce your daily sodium intake. Many people with hypertension should eat less than 1,500 mg of sodium a day. Do not drink alcohol if: Your health care provider tells you not to drink. You are pregnant, may be pregnant, or are planning to become pregnant. If you drink alcohol: Limit how much you have to: 0-1 drink a day for women. 0-2 drinks a day for men. Know how much alcohol is in your drink. In the [place] one drink equals one 12 oz bottle of beer (355 mL), one 5 oz glass of wine (148 mL), or one 1 oz glass of hard liquor (44 mL). Lifestyle  Work with your health care provider to maintain a healthy body weight or to lose weight. Ask what an ideal weight is for you. Get at least 30 minutes of exercise that causes your heart to beat faster (aerobic exercise) most days of the week. Activities may include walking, swimming, or biking. Include exercise to strengthen your muscles (resistance exercise), such as Pilates or lifting weights, as part of your weekly exercise routine. Try to do these types of exercises for 30 minutes at least 3 days a week. Do not use any products that contain nicotine or tobacco. These products include cigarettes, chewing tobacco, and vaping devices, such as e-cigarettes. If you need help quitting, ask your health care provider. Monitor your blood pressure at home as told by your health care provider. Keep all follow-up visits. This is important. Medicines Take over-the-counter and prescription medicines only as told by your health care provider. Follow directions carefully. Blood  pressure medicines must be taken as prescribed. Do not skip doses of blood pressure medicine. Doing this puts you at risk for problems and can make the medicine less effective. Ask your health care provider about side effects or reactions to medicines that you should watch for. Contact a health care provider if you: Think you are having a reaction to a medicine you are taking. Have headaches that keep coming back (recurring). Feel dizzy. Have swelling in your ankles. Have trouble with your vision. Get help right away if you: Develop a severe headache or confusion. Have unusual weakness or numbness. Feel faint. Have severe pain in your chest or abdomen. Vomit repeatedly. Have trouble breathing. These symptoms may be an emergency. Get help right away. Call 911. Do not wait to see if the symptoms will go away. Do not drive yourself to the hospital. Summary Hypertension is when the force of blood pumping through your arteries is too strong. If this condition is not controlled, it may put you at risk for serious complications. Your personal target blood pressure may vary depending on your medical conditions, your age, and other factors. For most people, a normal blood pressure is less than 120/80. Hypertension is treated with lifestyle changes, medicines, or a combination of both. Lifestyle changes include losing weight, eating a healthy,  low-sodium diet, exercising more, and limiting alcohol. This information is not intended to replace advice given to you by your health care provider. Make sure you discuss any questions you have with your health care provider. Document Revised: 01/02/2021 Document Reviewed: 01/02/2021 Elsevier Patient Education  2024 [employer].
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
## 2023-09-02 ENCOUNTER — Other Ambulatory Visit: Payer: Self-pay

## 2023-09-02 DIAGNOSIS — I10 Essential (primary) hypertension: Secondary | ICD-10-CM

## 2023-09-02 MED ORDER — DILTIAZEM HCL ER COATED BEADS 300 MG PO CP24: 300.0000 mg | ORAL_CAPSULE | Freq: Every day | ORAL | 0 refills | Status: DC

## 2023-09-12 NOTE — Progress Notes (Signed)
 HPI: FU Atrial fibrillation; history of an acute anterior myocardial infarction occurring in the setting of atrial fibrillation, felt secondary to an embolic event. His LV function is preserved. We have been treating with rate control and anticoagulation at his request. His previous cardiac catheterization in August of 2009 revealed an occluded LAD but no other obstructive disease. The LAD occlusion was felt secondary to an embolus and resolved with thrombus aspiration. Abdominal ultrasound in October 2014 showed no aneurysm. Echocardiogram repeated in November 2014 and showed normal LV function, mild left ventricular hypertrophy and moderate left atrial enlargement. Carotid Dopplers in Nov 2015 showed no significant stenosis. Since I last saw him, there is no dyspnea, chest pain, palpitations or syncope.  No bleeding.  Current Outpatient Medications  Medication Sig Dispense Refill   apixaban  (ELIQUIS ) 5 MG TABS tablet Take 1 tablet (5 mg total) by mouth 2 (two) times daily. 180 tablet 0   Capsaicin 0.1 % CREA Apply topically. Apply small amount to affected area at bedtime to both feet     diltiazem  (CARDIZEM  CD) 300 MG 24 hr capsule Take 1 capsule (300 mg total) by mouth daily. 30 capsule 0   dorzolamide-timolol (COSOPT) 2-0.5 % ophthalmic solution 1 drop 2 (two) times daily.     levothyroxine  (SYNTHROID ) 50 MCG tablet Take 50 mcg by mouth daily before breakfast.     selenium sulfide (SELSUN) 2.5 % shampoo Apply 1 application topically daily as needed for irritation. apply small amount of affected area every other day.     tamsulosin  (FLOMAX ) 0.4 MG CAPS capsule Take 1 capsule (0.4 mg total) by mouth daily. 90 capsule 1   Ciclopirox  0.77 % gel Apply 1 Act topically 2 (two) times daily. (Patient not taking: Reported on 09/19/2023) 45 g 3   Difluprednate 0.05 % EMUL Apply to eye. (Patient not taking: Reported on 09/19/2023)     ketoconazole (NIZORAL) 2 % shampoo Apply 1 application topically.  (Patient not taking: Reported on 09/19/2023)     spironolactone  (ALDACTONE ) 25 MG tablet TAKE 1 TABLET(25 MG) BY MOUTH DAILY (Patient not taking: Reported on 09/19/2023) 90 tablet 0   triamcinolone  cream (KENALOG ) 0.5 % APPLY EXTERNALLY TO THE AFFECTED AREA TWICE DAILY (Patient not taking: Reported on 09/19/2023) 60 g 2   No current facility-administered medications for this visit.     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[facility])    Presumed secondary to embolus from atrial fibrillation   Arthritis    Atrial fibrillation (HCC)    Cataract    right eye    Glaucoma    Hard of hearing    Hemorrhoid    Hx of adenomatous colonic polyps 07/2008   Hyperlipidemia    Hypertension     Past Surgical History:  Procedure Laterality Date   COLONOSCOPY     POLYPECTOMY     STAPEDES SURGERY      Social History   Socioeconomic History   Marital status: Married    Spouse name: Lousie   Number of children: 6   Years of education: Not on file   Highest education level: Not on file  Occupational History   Occupation: Retired    Associate Professor: RETIRED  Tobacco Use   Smoking status: Former   Smokeless tobacco: Never   Tobacco comments:    quit x 35 years ago  Vaping Use   Vaping status: Never Used  Substance and Sexual Activity   Alcohol use: No  Alcohol/week: 0.0 standard drinks of alcohol   Drug use: No   Sexual activity: Never  Other Topics Concern   Not on file  Social History Narrative   Lives with spouse   Social Drivers of Health   Financial Resource Strain: Low Risk  (12/02/2022)   Overall Financial Resource Strain (CARDIA)    Difficulty of Paying Living Expenses: Not hard at all  Food Insecurity: No Food Insecurity (12/02/2022)   Hunger Vital Sign    Worried About Running Out of Food in the Last Year: Never true    Ran Out of Food in the Last Year: Never true  Transportation Needs: No Transportation Needs (12/02/2022)   PRAPARE -  Administrator, Civil Service (Medical): No    Lack of Transportation (Non-Medical): No  Physical Activity: Sufficiently Active (12/02/2022)   Exercise Vital Sign    Days of Exercise per Week: 3 days    Minutes of Exercise per Session: 60 min  Stress: No Stress Concern Present (12/02/2022)   [employer] of Occupational Health - Occupational Stress Questionnaire    Feeling of Stress : Not at all  Social Connections: Moderately Integrated (12/02/2022)   Social Connection and Isolation Panel    Frequency of Communication with Friends and Family: Never    Frequency of Social Gatherings with Friends and Family: More than three times a week    Attends Religious Services: More than 4 times per year    Active Member of [employer] or Organizations: No    Attends Banker Meetings: Never    Marital Status: Married  Catering manager Violence: Not At Risk (12/02/2022)   Humiliation, Afraid, Rape, and Kick questionnaire    Fear of Current or Ex-Partner: No    Emotionally Abused: No    Physically Abused: No    Sexually Abused: No    Family History  Problem Relation Age of Onset   Heart disease Mother    Cancer Neg Hx    Stroke Neg Hx    Hypertension Neg Hx    Hyperlipidemia Neg Hx    Diabetes Neg Hx    Esophageal cancer Neg Hx    Colon cancer Neg Hx    Pancreatic cancer Neg Hx    Stomach cancer Neg Hx     ROS: no fevers or chills, productive cough, hemoptysis, dysphasia, odynophagia, melena, hematochezia, dysuria, hematuria, rash, seizure activity, orthopnea, PND, pedal edema, claudication. Remaining systems are negative.  Physical Exam: Well-developed well-nourished in no acute distress.  Skin is warm and dry.  HEENT is normal.  Neck is supple.  Chest is clear to auscultation with normal expansion.  Cardiovascular exam is regular rate and rhythm.  Abdominal exam nontender or distended. No masses palpated. Extremities show no edema. neuro grossly intact  EKG  Interpretation Date/Time:  Friday September 19 2023 08:57:27 EDT Ventricular Rate:  59 PR Interval:    QRS Duration:  88 QT Interval:  414 QTC Calculation: 409 R Axis:   -7  Text Interpretation: Atrial fibrillation with slow ventricular response Nonspecific ST and T wave abnormality Confirmed by Pietro Rogue ([id number]) on 09/19/2023 8:59:23 AM    A/P  1 permanent atrial fibrillation-continue Cardizem  for rate control.  Continue apixaban .  Would repeat echocardiogram to reassess LV function.  Check hemoglobin and renal function.  2 hypertension-patient's blood pressure is elevated;   3 hyperlipidemia-intolerant to statins and declined all other lipid-lowering medications.  Rogue Pietro, MD

## 2023-09-19 ENCOUNTER — Encounter: Payer: Self-pay | Admitting: Cardiology

## 2023-09-19 ENCOUNTER — Ambulatory Visit: Attending: Cardiology | Admitting: Cardiology

## 2023-09-19 VITALS — BP 156/70 | HR 59 | Ht 65.0 in | Wt 165.0 lb

## 2023-09-19 DIAGNOSIS — E7800 Pure hypercholesterolemia, unspecified: Secondary | ICD-10-CM | POA: Insufficient documentation

## 2023-09-19 DIAGNOSIS — I10 Essential (primary) hypertension: Secondary | ICD-10-CM | POA: Diagnosis not present

## 2023-09-19 DIAGNOSIS — I4821 Permanent atrial fibrillation: Secondary | ICD-10-CM | POA: Diagnosis not present

## 2023-09-19 NOTE — Patient Instructions (Addendum)
 Medication Instructions:  NO CHANGES *If you need a refill on your cardiac medications before your next appointment, please call your pharmacy*  Lab Work: CBC AND BMET TODAY If you have labs (blood work) drawn today and your tests are completely normal, you will receive your results only by: MyChart Message (if you have MyChart) OR A paper copy in the mail If you have any lab test that is abnormal or we need to change your treatment, we will call you to review the results.  Testing/Procedures:[address] Your physician has requested that you have an echocardiogram. Echocardiography is a painless test that uses sound waves to create images of your heart. It provides your doctor with information about the size and shape of your heart and how well your heart's chambers and valves are working. This procedure takes approximately one hour. There are no restrictions for this procedure. Please do NOT wear cologne, perfume, aftershave, or lotions (deodorant is allowed). Please arrive 15 minutes prior to your appointment time.  Please note: We ask at that you not bring children with you during ultrasound (echo/ vascular) testing. Due to room size and safety concerns, children are not allowed in the ultrasound rooms during exams. Our front office staff cannot provide observation of children in our lobby area while testing is being conducted. An adult accompanying a patient to their appointment will only be allowed in the ultrasound room at the discretion of the ultrasound technician under special circumstances. We apologize for any inconvenience.   Follow-Up: At [facility], you and your health needs are our priority.  As part of our continuing mission to provide you with exceptional heart care, our providers are all part of one team.  This team includes your primary Cardiologist (physician) and Advanced Practice Providers or APPs (Physician Assistants and Nurse Practitioners) who all work  together to provide you with the care you need, when you need it.  Your next appointment:   1 year(s)  Provider:   Redell Shallow, MD

## 2023-09-20 LAB — CBC
Hematocrit: 49.4 % (ref 37.5–51.0)
Hemoglobin: 16.2 g/dL (ref 13.0–17.7)
MCH: 32.1 pg (ref 26.6–33.0)
MCHC: 32.8 g/dL (ref 31.5–35.7)
MCV: 98 fL — ABNORMAL HIGH (ref 79–97)
Platelets: 196 x10E3/uL (ref 150–450)
RBC: 5.04 x10E6/uL (ref 4.14–5.80)
RDW: 12.1 % (ref 11.6–15.4)
WBC: 9.5 x10E3/uL (ref 3.4–10.8)

## 2023-09-20 LAB — BASIC METABOLIC PANEL WITH GFR
BUN/Creatinine Ratio: 10 (ref 10–24)
BUN: 11 mg/dL (ref 8–27)
CO2: 18 mmol/L — ABNORMAL LOW (ref 20–29)
Calcium: 9.4 mg/dL (ref 8.6–10.2)
Chloride: 100 mmol/L (ref 96–106)
Creatinine, Ser: 1.09 mg/dL (ref 0.76–1.27)
Glucose: 144 mg/dL — ABNORMAL HIGH (ref 70–99)
Potassium: 3.4 mmol/L — ABNORMAL LOW (ref 3.5–5.2)
Sodium: 139 mmol/L (ref 134–144)
eGFR: 69 mL/min/1.73 (ref >59)

## 2023-09-21 ENCOUNTER — Ambulatory Visit: Payer: Self-pay | Admitting: Cardiology

## 2023-09-25 ENCOUNTER — Telehealth: Payer: Self-pay | Admitting: Cardiology

## 2023-09-25 NOTE — Telephone Encounter (Signed)
  Rodney GORMAN Shallow, MD 09/21/2023  7:50 AM EDT     Increase K diet; no change in meds [name]       Spoke with pt's wife, Velia (ok per [facility]) regarding pt's recent lab work. Advised that potassium levels are slightly low. Advised per Dr. Shallow to increase potassium rich food in diet. Wife states the he eats quite a few foods that are good sources of potassium. No change in medications at this time. Pt's wife did ask for a copy of the labs to be mailed to them. Will place a copy in the mail.

## 2023-09-25 NOTE — Telephone Encounter (Signed)
 Pt's wife returning call regarding pt's lab results. Please advise

## 2023-10-25 ENCOUNTER — Other Ambulatory Visit: Payer: Self-pay | Admitting: Cardiology

## 2023-10-25 DIAGNOSIS — I10 Essential (primary) hypertension: Secondary | ICD-10-CM

## 2023-10-30 ENCOUNTER — Ambulatory Visit (HOSPITAL_COMMUNITY)
Admission: RE | Admit: 2023-10-30 | Discharge: 2023-10-30 | Disposition: A | Discharge status: Home / Self Care | Source: Ambulatory Visit | Attending: Cardiology | Admitting: Cardiology

## 2023-10-30 DIAGNOSIS — I4821 Permanent atrial fibrillation: Secondary | ICD-10-CM

## 2023-10-30 LAB — ECHOCARDIOGRAM COMPLETE
Area-P 1/2: 4.52 cm2
S' Lateral: 2.9 cm

## 2023-12-05 ENCOUNTER — Encounter

## 2023-12-08 NOTE — Progress Notes (Signed)
 This encounter was created in error - please disregard.

## 2024-01-20 LAB — HM DIABETES EYE EXAM

## 2024-01-21 ENCOUNTER — Other Ambulatory Visit: Payer: Self-pay | Admitting: Internal Medicine

## 2024-01-21 DIAGNOSIS — N402 Nodular prostate without lower urinary tract symptoms: Secondary | ICD-10-CM

## 2024-02-23 ENCOUNTER — Encounter: Payer: Self-pay | Admitting: Internal Medicine

## 2024-02-23 ENCOUNTER — Ambulatory Visit

## 2024-02-23 ENCOUNTER — Ambulatory Visit: Payer: Self-pay | Admitting: Internal Medicine

## 2024-02-23 ENCOUNTER — Ambulatory Visit: Admitting: Internal Medicine

## 2024-02-23 VITALS — BP 132/84 | HR 69 | Temp 98.1°F | Ht 65.0 in | Wt 162.6 lb

## 2024-02-23 VITALS — BP 132/84 | Ht 65.0 in | Wt 162.0 lb

## 2024-02-23 DIAGNOSIS — I482 Chronic atrial fibrillation, unspecified: Secondary | ICD-10-CM

## 2024-02-23 DIAGNOSIS — E785 Hyperlipidemia, unspecified: Secondary | ICD-10-CM

## 2024-02-23 DIAGNOSIS — I10 Essential (primary) hypertension: Secondary | ICD-10-CM

## 2024-02-23 DIAGNOSIS — E876 Hypokalemia: Secondary | ICD-10-CM | POA: Diagnosis not present

## 2024-02-23 DIAGNOSIS — N1831 Chronic kidney disease, stage 3a: Secondary | ICD-10-CM

## 2024-02-23 DIAGNOSIS — Z0000 Encounter for general adult medical examination without abnormal findings: Secondary | ICD-10-CM

## 2024-02-23 DIAGNOSIS — E039 Hypothyroidism, unspecified: Secondary | ICD-10-CM

## 2024-02-23 DIAGNOSIS — N402 Nodular prostate without lower urinary tract symptoms: Secondary | ICD-10-CM

## 2024-02-23 DIAGNOSIS — R7303 Prediabetes: Secondary | ICD-10-CM

## 2024-02-23 LAB — CBC WITH DIFFERENTIAL/PLATELET
Basophils Absolute: 0.1 K/uL (ref 0.0–0.1)
Basophils Relative: 0.6 % (ref 0.0–3.0)
Eosinophils Absolute: 0.1 K/uL (ref 0.0–0.7)
Eosinophils Relative: 1.5 % (ref 0.0–5.0)
HCT: 48 % (ref 39.0–52.0)
Hemoglobin: 16.1 g/dL (ref 13.0–17.0)
Lymphocytes Relative: 21.8 % (ref 12.0–46.0)
Lymphs Abs: 2 K/uL (ref 0.7–4.0)
MCHC: 33.4 g/dL (ref 30.0–36.0)
MCV: 95 fl (ref 78.0–100.0)
Monocytes Absolute: 1 K/uL (ref 0.1–1.0)
Monocytes Relative: 11.2 % (ref 3.0–12.0)
Neutro Abs: 5.9 K/uL (ref 1.4–7.7)
Neutrophils Relative %: 64.9 % (ref 43.0–77.0)
Platelets: 166 K/uL (ref 150.0–400.0)
RBC: 5.05 Mil/uL (ref 4.22–5.81)
RDW: 12.8 % (ref 11.5–15.5)
WBC: 9.2 K/uL (ref 4.0–10.5)

## 2024-02-23 LAB — HEPATIC FUNCTION PANEL
ALT: 14 U/L (ref 0–53)
AST: 19 U/L (ref 0–37)
Albumin: 4.5 g/dL (ref 3.5–5.2)
Alkaline Phosphatase: 79 U/L (ref 39–117)
Bilirubin, Direct: 0.2 mg/dL (ref 0.0–0.3)
Total Bilirubin: 0.8 mg/dL (ref 0.2–1.2)
Total Protein: 7.4 g/dL (ref 6.0–8.3)

## 2024-02-23 LAB — URINALYSIS, ROUTINE W REFLEX MICROSCOPIC
Bilirubin Urine: NEGATIVE
Hgb urine dipstick: NEGATIVE
Ketones, ur: NEGATIVE
Leukocytes,Ua: NEGATIVE
Nitrite: NEGATIVE
RBC / HPF: NONE SEEN (ref 0–?)
Specific Gravity, Urine: 1.01 (ref 1.000–1.030)
Total Protein, Urine: NEGATIVE
Urine Glucose: NEGATIVE
Urobilinogen, UA: 1 (ref 0.0–1.0)
pH: 7.5 (ref 5.0–8.0)

## 2024-02-23 LAB — LIPID PANEL
Cholesterol: 197 mg/dL (ref 0–200)
HDL: 41.1 mg/dL (ref >39.00)
LDL Cholesterol: 134 mg/dL — ABNORMAL HIGH (ref 0–99)
NonHDL: 155.88
Total CHOL/HDL Ratio: 5
Triglycerides: 111 mg/dL (ref 0.0–149.0)
VLDL: 22.2 mg/dL (ref 0.0–40.0)

## 2024-02-23 LAB — MICROALBUMIN / CREATININE URINE RATIO
Creatinine,U: 109.6 mg/dL
Microalb Creat Ratio: 44.6 mg/g — ABNORMAL HIGH (ref 0.0–30.0)
Microalb, Ur: 4.9 mg/dL — ABNORMAL HIGH (ref 0.0–1.9)

## 2024-02-23 LAB — BASIC METABOLIC PANEL WITH GFR
BUN: 13 mg/dL (ref 6–23)
CO2: 27 meq/L (ref 19–32)
Calcium: 9.6 mg/dL (ref 8.4–10.5)
Chloride: 102 meq/L (ref 96–112)
Creatinine, Ser: 1.14 mg/dL (ref 0.40–1.50)
GFR: 61 mL/min (ref >60.00)
Glucose, Bld: 128 mg/dL — ABNORMAL HIGH (ref 70–99)
Potassium: 3.4 meq/L — ABNORMAL LOW (ref 3.5–5.1)
Sodium: 137 meq/L (ref 135–145)

## 2024-02-23 LAB — HEMOGLOBIN A1C: Hgb A1c MFr Bld: 5.3 % (ref 4.6–6.5)

## 2024-02-23 LAB — TSH: TSH: 5.4 u[IU]/mL (ref 0.35–5.50)

## 2024-02-23 MED ORDER — APIXABAN 5 MG PO TABS: 5.0000 mg | ORAL_TABLET | Freq: Two times a day (BID) | ORAL | 0 refills | Status: AC

## 2024-02-23 MED ORDER — TAMSULOSIN HCL 0.4 MG PO CAPS: 0.4000 mg | ORAL_CAPSULE | Freq: Every day | ORAL | 1 refills | Status: AC

## 2024-02-23 MED ORDER — POTASSIUM CHLORIDE ER 10 MEQ PO TBCR: 10.0000 meq | EXTENDED_RELEASE_TABLET | Freq: Two times a day (BID) | ORAL | 0 refills | Status: AC

## 2024-02-23 NOTE — Progress Notes (Signed)
 Subjective:  Patient ID: [name], male    DOB: January 08, 1946  Age: 78 y.o. MRN: [id number]  CC: Hypothyroidism (6 month follow up ), Hypertension, and Hyperlipidemia   HPI [name] presents for f/up -   Discussed the use of AI scribe software for clinical note transcription with the patient, who gave verbal consent to proceed.  History of Present Illness [name] is a 78 year old male who presents for a routine follow-up and medication management.  He feels good overall, stating he feels 'like I'm about [age], [age], something like that.' He denies any current issues or complaints and mentions staying active. He is satisfied with his care at the [facility], where he is a hundred percent service-connected and recently had an eye exam and received new glasses.  He is currently taking Flomax .  No respiratory problems are reported. He used to have sinus issues but not anymore. He has not had a flu shot since retiring approximately 24 years ago.  He recalls having an EKG done by a cardiologist within the last six months.  No weakness, respiratory problems, or recent breathing difficulties.     Outpatient Medications Prior to Visit  Medication Sig Dispense Refill   apixaban  (ELIQUIS ) 5 MG TABS tablet Take 1 tablet (5 mg total) by mouth 2 (two) times daily. 180 tablet 0   carboxymethylcellulose 1 % ophthalmic solution Apply 1 drop to eye 4 (four) times daily.     cycloSPORINE (RESTASIS) 0.05 % ophthalmic emulsion Place 1 drop into both eyes 2 (two) times daily.     diltiazem  (CARDIZEM  CD) 300 MG 24 hr capsule TAKE 1 CAPSULE(300 MG) BY MOUTH DAILY 30 capsule 10   dorzolamide-timolol (COSOPT) 2-0.5 % ophthalmic solution 1 drop 2 (two) times daily.     levothyroxine  (SYNTHROID ) 50 MCG tablet Take 50 mcg by mouth daily before breakfast.     tamsulosin  (FLOMAX ) 0.4 MG CAPS capsule TAKE 1 CAPSULE(0.4 MG) BY MOUTH DAILY 90 capsule 1   Capsaicin 0.1 % CREA Apply topically. Apply  small amount to affected area at bedtime to both feet     Ciclopirox  0.77 % gel Apply 1 Act topically 2 (two) times daily. (Patient not taking: Reported on 02/23/2024) 45 g 3   Difluprednate 0.05 % EMUL Apply to eye. (Patient not taking: Reported on 02/23/2024)     ketoconazole (NIZORAL) 2 % shampoo Apply 1 application topically. (Patient not taking: Reported on 02/23/2024)     selenium sulfide (SELSUN) 2.5 % shampoo Apply 1 application topically daily as needed for irritation. apply small amount of affected area every other day.     spironolactone  (ALDACTONE ) 25 MG tablet TAKE 1 TABLET(25 MG) BY MOUTH DAILY (Patient not taking: Reported on 02/23/2024) 90 tablet 0   triamcinolone  cream (KENALOG ) 0.5 % APPLY EXTERNALLY TO THE AFFECTED AREA TWICE DAILY (Patient not taking: Reported on 02/23/2024) 60 g 2   No facility-administered medications prior to visit.    ROS Review of Systems  Constitutional:  Negative for appetite change, chills, diaphoresis, fatigue and fever.  HENT:  Positive for hearing loss. Negative for facial swelling, sinus pressure, sore throat and trouble swallowing.   Eyes: Negative.   Respiratory:  Negative for choking, shortness of breath and wheezing.   Cardiovascular:  Negative for chest pain, palpitations and leg swelling.  Gastrointestinal:  Negative for abdominal pain, constipation, diarrhea, nausea and vomiting.  Endocrine: Negative.   Genitourinary: Negative.  Negative for difficulty urinating.  Musculoskeletal: Negative.  Negative for arthralgias and myalgias.  Skin: Negative.   Neurological:  Negative for dizziness and weakness.  Hematological:  Negative for adenopathy. Does not bruise/bleed easily.  Psychiatric/Behavioral:  Positive for confusion and decreased concentration.     Objective:  BP 132/84 (BP Location: Left Arm, Patient Position: Sitting, Cuff Size: Normal)   Pulse 69   Temp 98.1 F (36.7 C) (Oral)   Ht 5' 5 (1.651 m)   Wt 162 lb 9.6 oz (73.8  kg)   SpO2 96%   BMI 27.06 kg/m   BP Readings from Last 3 Encounters:  02/23/24 132/84  02/23/24 132/84  09/19/23 (!) 156/70    Wt Readings from Last 3 Encounters:  02/23/24 162 lb 9.6 oz (73.8 kg)  02/23/24 162 lb (73.5 kg)  09/19/23 165 lb (74.8 kg)    Physical Exam Vitals reviewed.  HENT:     Nose: Nose normal.     Mouth/Throat:     Mouth: Mucous membranes are moist.  Eyes:     General: No scleral icterus.    Conjunctiva/sclera: Conjunctivae normal.  Cardiovascular:     Rate and Rhythm: Normal rate and regular rhythm.     Heart sounds: No murmur heard.    No friction rub. No gallop.  Pulmonary:     Effort: Pulmonary effort is normal.     Breath sounds: No stridor. No wheezing, rhonchi or rales.  Abdominal:     General: Abdomen is flat.     Palpations: There is no mass.     Tenderness: There is no abdominal tenderness. There is no guarding.     Hernia: No hernia is present.  Musculoskeletal:        General: Normal range of motion.     Cervical back: Neck supple.     Right lower leg: No edema.     Left lower leg: No edema.  Lymphadenopathy:     Cervical: No cervical adenopathy.  Skin:    General: Skin is warm and dry.     Findings: No rash.  Neurological:     General: No focal deficit present.     Mental Status: He is alert and oriented to person, place, and time.  Psychiatric:        Mood and Affect: Mood normal.        Behavior: Behavior normal.     Lab Results  Component Value Date   WBC 9.2 02/23/2024   HGB 16.1 02/23/2024   HCT 48.0 02/23/2024   PLT 166.0 02/23/2024   GLUCOSE 128 (H) 02/23/2024   CHOL 197 02/23/2024   TRIG 111.0 02/23/2024   HDL 41.10 02/23/2024   LDLDIRECT 164.0 01/03/2020   LDLCALC 134 (H) 02/23/2024   ALT 14 02/23/2024   AST 19 02/23/2024   NA 137 02/23/2024   K 3.4 (L) 02/23/2024   CL 102 02/23/2024   CREATININE 1.14 02/23/2024   BUN 13 02/23/2024   CO2 27 02/23/2024   TSH 5.40 02/23/2024   PSA 2.01 07/01/2019    INR 2.7 05/26/2018   HGBA1C 5.3 02/23/2024   MICROALBUR 4.9 (H) 02/23/2024    ECHOCARDIOGRAM COMPLETE Result Date: 10/30/2023    ECHOCARDIOGRAM REPORT   Patient Name:   [name] Date of Exam: 10/30/2023 Medical Rec #:  [id number]       Height:       65.0 in Accession #:    [id number]      Weight:       165.0 lb Date of Birth:  1946/01/07       BSA:          1.823 m Patient Age:    78 years        BP:           181/106 mmHg Patient Gender: M               HR:           69 bpm. Exam Location:  [address] Procedure: 2D Echo, Cardiac Doppler and Color Doppler (Both Spectral and Color            Flow Doppler were utilized during procedure). Indications:    I48.91 Atrial Fibrillation  History:        Patient has prior history of Echocardiogram examinations, most                 recent 01/21/2013. Risk Factors:Hypertension and HLD.  Sonographer:    Waldo Guadalajara RCS Referring Phys: [id number] BRIAN S CRENSHAW IMPRESSIONS  1. Left ventricular ejection fraction, by estimation, is 60 to 65%. The left ventricle has normal function. The left ventricle has no regional wall motion abnormalities. There is mild left ventricular hypertrophy. Left ventricular diastolic function could not be evaluated due to atrial fibrillation.  2. Right ventricular systolic function is normal. The right ventricular size is normal. There is normal pulmonary artery systolic pressure. The estimated right ventricular systolic pressure is 26.4 mmHg.  3. Left atrial size was moderately dilated.  4. The mitral valve is normal in structure. No evidence of mitral valve regurgitation. No evidence of mitral stenosis.  5. The aortic valve is tricuspid. There is mild calcification of the aortic valve. Aortic valve regurgitation is not visualized. Aortic valve sclerosis/calcification is present, without any evidence of aortic stenosis.  6. The inferior vena cava is normal in size with greater than 50% respiratory variability, suggesting right atrial  pressure of 3 mmHg. FINDINGS  Left Ventricle: Left ventricular ejection fraction, by estimation, is 60 to 65%. The left ventricle has normal function. The left ventricle has no regional wall motion abnormalities. The left ventricular internal cavity size was normal in size. There is  mild left ventricular hypertrophy. Left ventricular diastolic function could not be evaluated due to atrial fibrillation. Left ventricular diastolic function could not be evaluated. Right Ventricle: The right ventricular size is normal. No increase in right ventricular wall thickness. Right ventricular systolic function is normal. There is normal pulmonary artery systolic pressure. The tricuspid regurgitant velocity is 2.42 m/s, and  with an assumed right atrial pressure of 3 mmHg, the estimated right ventricular systolic pressure is 26.4 mmHg. Left Atrium: Left atrial size was moderately dilated. Right Atrium: Right atrial size was normal in size. Pericardium: There is no evidence of pericardial effusion. Mitral Valve: The mitral valve is normal in structure. No evidence of mitral valve regurgitation. No evidence of mitral valve stenosis. Tricuspid Valve: The tricuspid valve is normal in structure. Tricuspid valve regurgitation is trivial. No evidence of tricuspid stenosis. Aortic Valve: The aortic valve is tricuspid. There is mild calcification of the aortic valve. Aortic valve regurgitation is not visualized. Aortic valve sclerosis/calcification is present, without any evidence of aortic stenosis. Pulmonic Valve: The pulmonic valve was normal in structure. Pulmonic valve regurgitation is not visualized. No evidence of pulmonic stenosis. Aorta: The aortic root is normal in size and structure. Venous: The inferior vena cava is normal in size with greater than 50% respiratory variability, suggesting right atrial pressure of 3 mmHg.  IAS/Shunts: The interatrial septum was not well visualized.  LEFT VENTRICLE PLAX 2D LVIDd:         4.10 cm    Diastology LVIDs:         2.90 cm   LV e' medial:    10.20 cm/s LV PW:         1.30 cm   LV E/e' medial:  10.0 LV IVS:        1.40 cm   LV e' lateral:   12.30 cm/s LVOT diam:     2.00 cm   LV E/e' lateral: 8.3 LV SV:         49 LV SV Index:   27 LVOT Area:     3.14 cm  RIGHT VENTRICLE RV Basal diam:  3.70 cm RV S prime:     12.00 cm/s TAPSE (M-mode): 1.9 cm RVSP:           26.4 mmHg LEFT ATRIUM             Index        RIGHT ATRIUM           Index LA diam:        4.90 cm 2.69 cm/m   RA Pressure: 3.00 mmHg LA Vol (A2C):   84.1 ml 46.14 ml/m  RA Area:     17.20 cm LA Vol (A4C):   67.5 ml 37.03 ml/m  RA Volume:   43.70 ml  23.97 ml/m LA Biplane Vol: 75.9 ml 41.64 ml/m  AORTIC VALVE LVOT Vmax:   66.57 cm/s LVOT Vmean:  49.000 cm/s LVOT VTI:    0.155 m  AORTA Ao Root diam: 3.10 cm Ao Asc diam:  3.50 cm MITRAL VALVE                TRICUSPID VALVE MV Area (PHT):              TR Peak grad:   23.4 mmHg MV Decel Time:              TR Vmax:        242.00 cm/s MV E velocity: 102.33 cm/s  Estimated RAP:  3.00 mmHg MV A velocity: 98.00 cm/s   RVSP:           26.4 mmHg MV E/A ratio:  1.04                             SHUNTS                             Systemic VTI:  0.16 m                             Systemic Diam: 2.00 cm Morene Brownie Electronically signed by Morene Brownie Signature Date/Time: 10/30/2023/11:21:54 AM    Final    The ASCVD Risk score (Arnett DK, et al., 2019) failed to calculate for the following reasons:   Risk score cannot be calculated because patient has a medical history suggesting prior/existing ASCVD   * - Cholesterol units were assumed   Estimated Creatinine Clearance: 49.3 mL/min (by C-G formula based on SCr of 1.14 mg/dL).   Assessment & Plan:  Acquired hypothyroidism- He is euthyroid. -     TSH; Future -     CBC with Differential/Platelet; Future  Hyperlipidemia with target LDL less  than 70- He is not willing to take a statin. -     Hepatic function panel; Future -     Lipid  panel; Future  Chronic atrial fibrillation (HCC)- He has good R/R control.  Prediabetes -     Hemoglobin A1c; Future -     Basic metabolic panel with GFR; Future -     HM DIABETES FOOT EXAM  Stage 3a chronic kidney disease (HCC) -     Urinalysis, Routine w reflex microscopic; Future -     Basic metabolic panel with GFR; Future -     CBC with Differential/Platelet; Future -     Microalbumin / creatinine urine ratio; Future  Essential hypertension- BP is well controlled. -     Urinalysis, Routine w reflex microscopic; Future -     Basic metabolic panel with GFR; Future -     CBC with Differential/Platelet; Future -     Potassium Chloride  ER; Take 1 tablet (10 mEq total) by mouth 2 (two) times daily.  Dispense: 180 tablet; Refill: 0  Prostate nodule without urinary obstruction -     Tamsulosin  HCl; Take 1 capsule (0.4 mg total) by mouth daily.  Dispense: 90 capsule; Refill: 1  Hypokalemia due to inadequate potassium intake -     Potassium Chloride  ER; Take 1 tablet (10 mEq total) by mouth 2 (two) times daily.  Dispense: 180 tablet; Refill: 0     Follow-up: Return in about 6 months (around 08/23/2024).  Debby Molt, MD

## 2024-02-23 NOTE — Progress Notes (Signed)
 Chief Complaint  Patient presents with   Medicare Wellness     Subjective:   [name] is a 78 y.o. male who presents for a Medicare Annual Wellness Visit.  Visit info / Clinical Intake: Medicare Wellness Visit Type:: Subsequent Annual Wellness Visit Persons participating in visit and providing information:: patient (Spouse assisted - Kelwin Gibler) Medicare Wellness Visit Mode:: Telephone If telephone:: video declined Since this visit was completed virtually, some vitals may be partially provided or unavailable. Missing vitals are due to the limitations of the virtual format.: Documented vitals are patient reported If Telephone or Video please confirm:: I connected with patient using audio/video enable telemedicine. I verified patient identity with two identifiers, discussed telehealth limitations, and patient agreed to proceed. Patient Location:: Home Provider Location:: Office Interpreter Needed?: No Pre-visit prep was completed: yes AWV questionnaire completed by patient prior to visit?: no Living arrangements:: lives with spouse/significant other Patient's Overall Health Status Rating: good Typical amount of pain: none Does pain affect daily life?: no Are you currently prescribed opioids?: no  Dietary Habits and Nutritional Risks How many meals a day?: 3 Eats fruit and vegetables daily?: yes Most meals are obtained by: preparing own meals; having others provide food; eating out In the last 2 weeks, have you had any of the following?: none Diabetic:: no  Functional Status Activities of Daily Living (to include ambulation/medication): Independent Ambulation: Independent with device- listed below Home Assistive Devices/Equipment: Eyeglasses; Dentures (specify type) Medication Administration: Independent Home Management (perform basic housework or laundry): Independent Manage your own finances?: yes Primary transportation is: driving Concerns about vision?: no  *vision screening is required for WTM* Concerns about hearing?: (!) yes Uses hearing aids?: (!) yes Hear whispered voice?: yes  Fall Screening Falls in the past year?: 0 Number of falls in past year: 0 Was there an injury with Fall?: 0 Fall Risk Category Calculator: 0 Patient Fall Risk Level: Low Fall Risk  Fall Risk Patient at Risk for Falls Due to: No Fall Risks Fall risk Follow up: Falls evaluation completed; Falls prevention discussed  Home and Transportation Safety: All rugs have non-skid backing?: N/A, no rugs All stairs or steps have railings?: yes Grab bars in the bathtub or shower?: yes Have non-skid surface in bathtub or shower?: yes Good home lighting?: yes Regular seat belt use?: yes Hospital stays in the last year:: no  Cognitive Assessment Difficulty concentrating, remembering, or making decisions? : no Will 6CIT or Mini Cog be Completed: yes What year is it?: 0 points What month is it?: 0 points Give patient an address phrase to remember (5 components): [address] [place], [place] About what time is it?: 0 points Count backwards from 20 to 1: 0 points Say the months of the year in reverse: 0 points Repeat the address phrase from earlier: 0 points 6 CIT Score: 0 points  Advance Directives (For Healthcare) Does Patient Have a Medical Advance Directive?: No Would patient like information on creating a medical advance directive?: No - Patient declined  Reviewed/Updated  Reviewed/Updated: Reviewed All (Medical, Surgical, Family, Medications, Allergies, Care Teams, Patient Goals)    Allergies (verified) Statins, Vascepa  [icosapent  ethyl (epa ethyl ester) (fish)], Lipitor [atorvastatin calcium], Lovastatin , and Other   Current Medications (verified) Outpatient Encounter Medications as of 02/23/2024  Medication Sig   apixaban  (ELIQUIS ) 5 MG TABS tablet Take 1 tablet (5 mg total) by mouth 2 (two) times daily.   carboxymethylcellulose 1 % ophthalmic solution  Apply 1 drop to eye 4 (four)  times daily.   cycloSPORINE (RESTASIS) 0.05 % ophthalmic emulsion Place 1 drop into both eyes 2 (two) times daily.   diltiazem  (CARDIZEM  CD) 300 MG 24 hr capsule TAKE 1 CAPSULE(300 MG) BY MOUTH DAILY   dorzolamide-timolol (COSOPT) 2-0.5 % ophthalmic solution 1 drop 2 (two) times daily.   levothyroxine  (SYNTHROID ) 50 MCG tablet Take 50 mcg by mouth daily before breakfast.   potassium chloride  (KLOR-CON  10) 10 MEQ tablet Take 1 tablet (10 mEq total) by mouth 2 (two) times daily.   tamsulosin  (FLOMAX ) 0.4 MG CAPS capsule Take 1 capsule (0.4 mg total) by mouth daily.   [DISCONTINUED] Capsaicin 0.1 % CREA Apply topically. Apply small amount to affected area at bedtime to both feet   [DISCONTINUED] selenium sulfide (SELSUN) 2.5 % shampoo Apply 1 application topically daily as needed for irritation. apply small amount of affected area every other day.   [DISCONTINUED] tamsulosin  (FLOMAX ) 0.4 MG CAPS capsule TAKE 1 CAPSULE(0.4 MG) BY MOUTH DAILY   [DISCONTINUED] Ciclopirox  0.77 % gel Apply 1 Act topically 2 (two) times daily. (Patient not taking: Reported on 02/23/2024)   [DISCONTINUED] Difluprednate 0.05 % EMUL Apply to eye. (Patient not taking: Reported on 02/23/2024)   [DISCONTINUED] ketoconazole (NIZORAL) 2 % shampoo Apply 1 application topically. (Patient not taking: Reported on 02/23/2024)   [DISCONTINUED] spironolactone  (ALDACTONE ) 25 MG tablet TAKE 1 TABLET(25 MG) BY MOUTH DAILY (Patient not taking: Reported on 02/23/2024)   [DISCONTINUED] triamcinolone  cream (KENALOG ) 0.5 % APPLY EXTERNALLY TO THE AFFECTED AREA TWICE DAILY (Patient not taking: Reported on 02/23/2024)   No facility-administered encounter medications on file as of 02/23/2024.    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 atrial fibrillation   Arthritis    Atrial fibrillation (HCC)    Cataract    right eye     Glaucoma    Hard of hearing    Hemorrhoid    Hx of adenomatous colonic polyps 07/2008   Hyperlipidemia    Hypertension    Past Surgical History:  Procedure Laterality Date   COLONOSCOPY     POLYPECTOMY     STAPEDES SURGERY     Family History  Problem Relation Age of Onset   Heart disease Mother    Cancer Neg Hx    Stroke Neg Hx    Hypertension Neg Hx    Hyperlipidemia Neg Hx    Diabetes Neg Hx    Esophageal cancer Neg Hx    Colon cancer Neg Hx    Pancreatic cancer Neg Hx    Stomach cancer Neg Hx    Social History   Occupational History   Occupation: Retired    Associate Professor: RETIRED  Tobacco Use   Smoking status: Former   Smokeless tobacco: Never   Tobacco comments:    quit x 35 years ago  Vaping Use   Vaping status: Never Used  Substance and Sexual Activity   Alcohol use: No    Alcohol/week: 0.0 standard drinks of alcohol   Drug use: No   Sexual activity: Yes   Tobacco Counseling Counseling given: No Tobacco comments: quit x 35 years ago  SDOH Screenings   Food Insecurity: No Food Insecurity (02/23/2024)  Housing: Unknown (02/23/2024)  Transportation Needs: No Transportation Needs (02/23/2024)  Utilities: Not At Risk (02/23/2024)  Alcohol Screen: Low Risk (12/02/2022)  Depression (PHQ2-9): Low Risk (02/23/2024)  Financial Resource Strain: Low Risk (12/02/2022)  Physical Activity: Sufficiently Active (02/23/2024)  Social  Connections: Moderately Integrated (02/23/2024)  Stress: No Stress Concern Present (02/23/2024)  Tobacco Use: Medium Risk (02/23/2024)  Health Literacy: Adequate Health Literacy (02/23/2024)   See flowsheets for full screening details  Depression Screen PHQ 2 & 9 Depression Scale- Over the past 2 weeks, how often have you been bothered by any of the following problems? Little interest or pleasure in doing things: 0 Feeling down, depressed, or hopeless (PHQ Adolescent also includes...irritable): 0 PHQ-2 Total Score: 0 Trouble falling or  staying asleep, or sleeping too much: 0 Feeling tired or having little energy: 0 Poor appetite or overeating (PHQ Adolescent also includes...weight loss): 0 Feeling bad about yourself - or that you are a failure or have let yourself or your family down: 0 Trouble concentrating on things, such as reading the newspaper or watching television (PHQ Adolescent also includes...like school work): 0 Moving or speaking so slowly that other people could have noticed. Or the opposite - being so fidgety or restless that you have been moving around a lot more than usual: 0 Thoughts that you would be better off dead, or of hurting yourself in some way: 0 PHQ-9 Total Score: 0 If you checked off any problems, how difficult have these problems made it for you to do your work, take care of things at home, or get along with other people?: Not difficult at all  Depression Treatment Depression Interventions/Treatment : [id number] Score <4 Follow-up Not Indicated     Goals Addressed               This Visit's Progress     Patient Stated (pt-stated)        Patient stated he plans to continue exercising - walking              Objective:    Today's Vitals   02/23/24 0930  BP: 132/84  SpO2: 97%  Weight: 162 lb (73.5 kg)  Height: 5' 5 (1.651 m)   Body mass index is 26.96 kg/m.  Hearing/Vision screen Hearing Screening - Comments:: Wears hearing aids Vision Screening - Comments:: Wears rx glasses - up to date with routine eye exams with [facility] [place], [place]) Immunizations and Health Maintenance Health Maintenance  Topic Date Due   COVID-19 Vaccine (1 - 2025-26 season) 03/10/2024 (Originally 11/10/2023)   Influenza Vaccine  06/08/2024 (Originally 10/10/2023)   Colonoscopy  08/21/2024 (Originally 08/07/2019)   HEMOGLOBIN A1C  08/23/2024   OPHTHALMOLOGY EXAM  01/19/2025   Diabetic kidney evaluation - eGFR measurement  02/22/2025   Diabetic kidney evaluation - Urine ACR  02/22/2025   FOOT  EXAM  02/22/2025   Medicare Annual Wellness (AWV)  02/22/2025   DTaP/Tdap/Td (4 - Td or Tdap) 01/02/2030   Pneumococcal Vaccine: 50+ Years  Completed   Hepatitis C Screening  Completed   Zoster Vaccines- Shingrix  Completed   Meningococcal B Vaccine  Aged Out        Assessment/Plan:  This is a routine wellness examination for [facility].  I have recommended that this patient have a Influenza vaccine but he declines at this time. I have discussed the risks and benefits of this procedure with him. The patient verbalizes understanding.   Patient Care Team: Joshua Debby CROME, MD as PCP - General (Internal Medicine) Pietro Redell RAMAN, MD as PCP - Cardiology (Cardiology) Fate Morna SAILOR, [facility] (Inactive) as Pharmacist (Pharmacist)  I have personally reviewed and noted the following in the patients chart:   Medical and social history Use of alcohol, tobacco or illicit  drugs  Current medications and supplements including opioid prescriptions. Functional ability and status Nutritional status Physical activity Advanced directives List of other physicians Hospitalizations, surgeries, and ER visits in previous 12 months Vitals Screenings to include cognitive, depression, and falls Referrals and appointments  No orders of the defined types were placed in this encounter.  In addition, I have reviewed and discussed with patient certain preventive protocols, quality metrics, and best practice recommendations. A written personalized care plan for preventive services as well as general preventive health recommendations were provided to patient.   Verdie CHRISTELLA Saba, CMA   02/23/2024   Return in 1 year (on 02/22/2025).  After Visit Summary: (MyChart) Due to this being a telephonic visit, the after visit summary with patients personalized plan was offered to patient via MyChart   Nurse Notes: scheduled 2026 AWV/CPE appts

## 2024-02-23 NOTE — Patient Instructions (Signed)
 [name],  Thank you for taking the time for your Medicare Wellness Visit. I appreciate your continued commitment to your health goals. Please review the care plan we discussed, and feel free to reach out if I can assist you further.  Please note that Annual Wellness Visits do not include a physical exam. Some assessments may be limited, especially if the visit was conducted virtually. If needed, we may recommend an in-person follow-up with your provider.  Ongoing Care Seeing your primary care provider every 3 to 6 months helps us  monitor your health and provide consistent, personalized care.   Referrals If a referral was made during today's visit and you haven't received any updates within two weeks, please contact the referred provider directly to check on the status.  Recommended Screenings:  Health Maintenance  Topic Date Due   COVID-19 Vaccine (1 - 2025-26 season) 03/10/2024*   Flu Shot  06/08/2024*   Colon Cancer Screening  08/21/2024*   Hemoglobin A1C  08/23/2024   Eye exam for diabetics  01/19/2025   Yearly kidney function blood test for diabetes  02/22/2025   Yearly kidney health urinalysis for diabetes  02/22/2025   Complete foot exam   02/22/2025   Medicare Annual Wellness Visit  02/22/2025   DTaP/Tdap/Td vaccine (4 - Td or Tdap) 01/02/2030   Pneumococcal Vaccine for age over 54  Completed   Hepatitis C Screening  Completed   Zoster (Shingles) Vaccine  Completed   Meningitis B Vaccine  Aged Out  *Topic was postponed. The date shown is not the original due date.       02/23/2024    3:54 PM  Advanced Directives  Does Patient Have a Medical Advance Directive? No  Would patient like information on creating a medical advance directive? No - Patient declined    Vision: Annual vision screenings are recommended for early detection of glaucoma, cataracts, and diabetic retinopathy. These exams can also reveal signs of chronic conditions such as diabetes and high blood  pressure.  Dental: Annual dental screenings help detect early signs of oral cancer, gum disease, and other conditions linked to overall health, including heart disease and diabetes.

## 2024-02-23 NOTE — Patient Instructions (Signed)
 Hypertension, Adult High blood pressure (hypertension) is when the force of blood pumping through the arteries is too strong. The arteries are the blood vessels that carry blood from the heart throughout the body. Hypertension forces the heart to work harder to pump blood and may cause arteries to become narrow or stiff. Untreated or uncontrolled hypertension can lead to a heart attack, heart failure, a stroke, kidney disease, and other problems. A blood pressure reading consists of a higher number over a lower number. Ideally, your blood pressure should be below 120/80. The first ("top") number is called the systolic pressure. It is a measure of the pressure in your arteries as your heart beats. The second ("bottom") number is called the diastolic pressure. It is a measure of the pressure in your arteries as the heart relaxes. What are the causes? The exact cause of this condition is not known. There are some conditions that result in high blood pressure. What increases the risk? Certain factors may make you more likely to develop high blood pressure. Some of these risk factors are under your control, including: Smoking. Not getting enough exercise or physical activity. Being overweight. Having too much fat, sugar, calories, or salt (sodium) in your diet. Drinking too much alcohol. Other risk factors include: Having a personal history of heart disease, diabetes, high cholesterol, or kidney disease. Stress. Having a family history of high blood pressure and high cholesterol. Having obstructive sleep apnea. Age. The risk increases with age. What are the signs or symptoms? High blood pressure may not cause symptoms. Very high blood pressure (hypertensive crisis) may cause: Headache. Fast or irregular heartbeats (palpitations). Shortness of breath. Nosebleed. Nausea and vomiting. Vision changes. Severe chest pain, dizziness, and seizures. How is this diagnosed? This condition is diagnosed by  measuring your blood pressure while you are seated, with your arm resting on a flat surface, your legs uncrossed, and your feet flat on the floor. The cuff of the blood pressure monitor will be placed directly against the skin of your upper arm at the level of your heart. Blood pressure should be measured at least twice using the same arm. Certain conditions can cause a difference in blood pressure between your right and left arms. If you have a high blood pressure reading during one visit or you have normal blood pressure with other risk factors, you may be asked to: Return on a different day to have your blood pressure checked again. Monitor your blood pressure at home for 1 week or longer. If you are diagnosed with hypertension, you may have other blood or imaging tests to help your health care provider understand your overall risk for other conditions. How is this treated? This condition is treated by making healthy lifestyle changes, such as eating healthy foods, exercising more, and reducing your alcohol intake. You may be referred for counseling on a healthy diet and physical activity. Your health care provider may prescribe medicine if lifestyle changes are not enough to get your blood pressure under control and if: Your systolic blood pressure is above 130. Your diastolic blood pressure is above 80. Your personal target blood pressure may vary depending on your medical conditions, your age, and other factors. Follow these instructions at home: Eating and drinking  Eat a diet that is high in fiber and potassium, and low in sodium, added sugar, and fat. An example of this eating plan is called the DASH diet. DASH stands for Dietary Approaches to Stop Hypertension. To eat this way: Eat  plenty of fresh fruits and vegetables. Try to fill one half of your plate at each meal with fruits and vegetables. Eat whole grains, such as whole-wheat pasta, brown rice, or whole-grain bread. Fill about one  fourth of your plate with whole grains. Eat or drink low-fat dairy products, such as skim milk or low-fat yogurt. Avoid fatty cuts of meat, processed or cured meats, and poultry with skin. Fill about one fourth of your plate with lean proteins, such as fish, chicken without skin, beans, eggs, or tofu. Avoid pre-made and processed foods. These tend to be higher in sodium, added sugar, and fat. Reduce your daily sodium intake. Many people with hypertension should eat less than 1,500 mg of sodium a day. Do not drink alcohol if: Your health care provider tells you not to drink. You are pregnant, may be pregnant, or are planning to become pregnant. If you drink alcohol: Limit how much you have to: 0-1 drink a day for women. 0-2 drinks a day for men. Know how much alcohol is in your drink. In the [place] one drink equals one 12 oz bottle of beer (355 mL), one 5 oz glass of wine (148 mL), or one 1 oz glass of hard liquor (44 mL). Lifestyle  Work with your health care provider to maintain a healthy body weight or to lose weight. Ask what an ideal weight is for you. Get at least 30 minutes of exercise that causes your heart to beat faster (aerobic exercise) most days of the week. Activities may include walking, swimming, or biking. Include exercise to strengthen your muscles (resistance exercise), such as Pilates or lifting weights, as part of your weekly exercise routine. Try to do these types of exercises for 30 minutes at least 3 days a week. Do not use any products that contain nicotine or tobacco. These products include cigarettes, chewing tobacco, and vaping devices, such as e-cigarettes. If you need help quitting, ask your health care provider. Monitor your blood pressure at home as told by your health care provider. Keep all follow-up visits. This is important. Medicines Take over-the-counter and prescription medicines only as told by your health care provider. Follow directions carefully. Blood  pressure medicines must be taken as prescribed. Do not skip doses of blood pressure medicine. Doing this puts you at risk for problems and can make the medicine less effective. Ask your health care provider about side effects or reactions to medicines that you should watch for. Contact a health care provider if you: Think you are having a reaction to a medicine you are taking. Have headaches that keep coming back (recurring). Feel dizzy. Have swelling in your ankles. Have trouble with your vision. Get help right away if you: Develop a severe headache or confusion. Have unusual weakness or numbness. Feel faint. Have severe pain in your chest or abdomen. Vomit repeatedly. Have trouble breathing. These symptoms may be an emergency. Get help right away. Call 911. Do not wait to see if the symptoms will go away. Do not drive yourself to the hospital. Summary Hypertension is when the force of blood pumping through your arteries is too strong. If this condition is not controlled, it may put you at risk for serious complications. Your personal target blood pressure may vary depending on your medical conditions, your age, and other factors. For most people, a normal blood pressure is less than 120/80. Hypertension is treated with lifestyle changes, medicines, or a combination of both. Lifestyle changes include losing weight, eating a healthy,  low-sodium diet, exercising more, and limiting alcohol. This information is not intended to replace advice given to you by your health care provider. Make sure you discuss any questions you have with your health care provider. Document Revised: 01/02/2021 Document Reviewed: 01/02/2021 Elsevier Patient Education  2024 [employer].

## 2024-08-24 ENCOUNTER — Ambulatory Visit: Admitting: Internal Medicine

## 2025-03-02 ENCOUNTER — Ambulatory Visit

## 2025-03-02 ENCOUNTER — Encounter: Admitting: Internal Medicine
